# Patient Record
Sex: Male | Born: 1960 | Race: Black or African American | Hispanic: No | Marital: Married | Smoking: Current every day smoker
Health system: Southern US, Community
[De-identification: ages and names within clinical notes are randomized; demographics above are authoritative.]

## PROBLEM LIST (undated history)

## (undated) DIAGNOSIS — J449 Chronic obstructive pulmonary disease, unspecified: Secondary | ICD-10-CM

## (undated) DIAGNOSIS — J45909 Unspecified asthma, uncomplicated: Secondary | ICD-10-CM

## (undated) DIAGNOSIS — E7800 Pure hypercholesterolemia, unspecified: Secondary | ICD-10-CM

## (undated) DIAGNOSIS — S6291XA Unspecified fracture of right wrist and hand, initial encounter for closed fracture: Secondary | ICD-10-CM

## (undated) DIAGNOSIS — M1990 Unspecified osteoarthritis, unspecified site: Secondary | ICD-10-CM

---

## 1998-02-17 ENCOUNTER — Emergency Department (HOSPITAL_COMMUNITY): Admission: EM | Admit: 1998-02-17 | Discharge: 1998-02-17 | Payer: Self-pay | Admitting: Emergency Medicine

## 1998-07-07 ENCOUNTER — Emergency Department (HOSPITAL_COMMUNITY): Admission: EM | Admit: 1998-07-07 | Discharge: 1998-07-07 | Payer: Self-pay | Admitting: Emergency Medicine

## 1998-07-10 ENCOUNTER — Emergency Department (HOSPITAL_COMMUNITY): Admission: EM | Admit: 1998-07-10 | Discharge: 1998-07-10 | Payer: Self-pay | Admitting: Emergency Medicine

## 1998-07-11 ENCOUNTER — Encounter: Admission: RE | Admit: 1998-07-11 | Discharge: 1998-10-09 | Payer: Self-pay | Admitting: Internal Medicine

## 1998-07-27 ENCOUNTER — Emergency Department (HOSPITAL_COMMUNITY): Admission: EM | Admit: 1998-07-27 | Discharge: 1998-07-27 | Payer: Self-pay | Admitting: Emergency Medicine

## 1998-07-27 ENCOUNTER — Encounter: Payer: Self-pay | Admitting: Emergency Medicine

## 2002-10-03 ENCOUNTER — Encounter: Payer: Self-pay | Admitting: Emergency Medicine

## 2002-10-03 ENCOUNTER — Emergency Department (HOSPITAL_COMMUNITY): Admission: EM | Admit: 2002-10-03 | Discharge: 2002-10-03 | Payer: Self-pay | Admitting: Emergency Medicine

## 2003-06-12 ENCOUNTER — Emergency Department (HOSPITAL_COMMUNITY): Admission: EM | Admit: 2003-06-12 | Discharge: 2003-06-12 | Payer: Self-pay | Admitting: *Deleted

## 2005-12-15 ENCOUNTER — Emergency Department (HOSPITAL_COMMUNITY): Admission: EM | Admit: 2005-12-15 | Discharge: 2005-12-15 | Payer: Self-pay | Admitting: Emergency Medicine

## 2008-07-04 ENCOUNTER — Emergency Department (HOSPITAL_COMMUNITY): Admission: EM | Admit: 2008-07-04 | Discharge: 2008-07-04 | Payer: Self-pay | Admitting: Emergency Medicine

## 2009-06-16 ENCOUNTER — Encounter: Admission: RE | Admit: 2009-06-16 | Discharge: 2009-06-16 | Payer: Self-pay | Admitting: Occupational Medicine

## 2009-12-08 ENCOUNTER — Emergency Department (HOSPITAL_COMMUNITY): Admission: EM | Admit: 2009-12-08 | Discharge: 2009-12-08 | Payer: Self-pay | Admitting: Emergency Medicine

## 2011-01-23 LAB — POCT I-STAT, CHEM 8
BUN: 15 mg/dL (ref 6–23)
Calcium, Ion: 1.17 mmol/L (ref 1.12–1.32)
Chloride: 104 mEq/L (ref 96–112)
Creatinine, Ser: 1 mg/dL (ref 0.4–1.5)
Glucose, Bld: 93 mg/dL (ref 70–99)
HCT: 42 % (ref 39.0–52.0)
Hemoglobin: 14.3 g/dL (ref 13.0–17.0)
Potassium: 4.3 mEq/L (ref 3.5–5.1)
Sodium: 138 mEq/L (ref 135–145)
TCO2: 30 mmol/L (ref 0–100)

## 2011-01-23 LAB — HEMOCCULT GUIAC POC 1CARD (OFFICE): Fecal Occult Bld: POSITIVE

## 2013-04-11 ENCOUNTER — Emergency Department (HOSPITAL_COMMUNITY)
Admission: EM | Admit: 2013-04-11 | Discharge: 2013-04-11 | Disposition: A | Discharge status: Home / Self Care | Payer: 59 | Attending: Emergency Medicine | Admitting: Emergency Medicine

## 2013-04-11 ENCOUNTER — Emergency Department (HOSPITAL_COMMUNITY): Payer: 59

## 2013-04-11 ENCOUNTER — Encounter (HOSPITAL_COMMUNITY): Payer: Self-pay | Admitting: *Deleted

## 2013-04-11 DIAGNOSIS — Z9181 History of falling: Secondary | ICD-10-CM | POA: Insufficient documentation

## 2013-04-11 DIAGNOSIS — IMO0002 Reserved for concepts with insufficient information to code with codable children: Secondary | ICD-10-CM | POA: Insufficient documentation

## 2013-04-11 DIAGNOSIS — M25469 Effusion, unspecified knee: Secondary | ICD-10-CM | POA: Insufficient documentation

## 2013-04-11 DIAGNOSIS — F17200 Nicotine dependence, unspecified, uncomplicated: Secondary | ICD-10-CM | POA: Insufficient documentation

## 2013-04-11 DIAGNOSIS — M1710 Unilateral primary osteoarthritis, unspecified knee: Secondary | ICD-10-CM | POA: Insufficient documentation

## 2013-04-11 DIAGNOSIS — M1990 Unspecified osteoarthritis, unspecified site: Secondary | ICD-10-CM

## 2013-04-11 NOTE — ED Provider Notes (Signed)
History    This chart was scribed for Kevin Fitzgerald, non-physician practitioner working with Celene Kras, MD by Leone Payor, ED Scribe. This patient was seen in room TR09C/TR09C and the patient's care was started at 1906.   CSN: [id number]  Arrival date & time 04/11/13  1906   First MD Initiated Contact with Patient 04/11/13 1931      Chief Complaint  Patient presents with  . Knee Pain     The history is provided by the patient. No language interpreter was used.    HPI Comments: CAVION FAIOLA is a 52 y.o. male who presents to the Emergency Department complaining of ongoing, constant R knee pain and swelling for 3 days. States sometimes he has pain that shoots down his R leg. He states he slipped and fell about 2 weeks ago. He rates the pain as 1/10 currently while the knee is elevated and a 10/10 when standing or walking.   History reviewed. No pertinent past medical history.  History reviewed. No pertinent past surgical history.  History reviewed. No pertinent family history.  History  Substance Use Topics  . Smoking status: Current Every Day Smoker    Types: Cigarettes  . Smokeless tobacco: Not on file  . Alcohol Use: No      Review of Systems A complete 10 system review of systems was obtained and all systems are negative except as noted in the HPI and PMH.   Allergies  Review of patient's allergies indicates no known allergies.  Home Medications  No current outpatient prescriptions on file.  BP 157/87  Pulse 67  Temp(Src) 98.5 F (36.9 C) (Oral)  Resp 18  SpO2 97%  Physical Exam  Nursing note and vitals reviewed. Constitutional: He is oriented to person, place, and time. He appears well-developed and well-nourished. No distress.  HENT:  Head: Normocephalic and atraumatic.  Eyes: EOM are normal.  Neck: Neck supple. No tracheal deviation present.  Cardiovascular: Normal rate.   Intact distal pulses.   Pulmonary/Chest: Effort normal. No respiratory  distress.  Musculoskeletal: Normal range of motion.  R knee tender to palpation over the medial joint line and patella. No swelling, no erythema. No signs of cellulitis, or septic joint. ROM 5/5, strength 5/5.     Neurological: He is alert and oriented to person, place, and time.  Sensation intact.   Skin: Skin is warm and dry.  Psychiatric: He has a normal mood and affect. His behavior is normal.    ED Course  Procedures (including critical care time)  DIAGNOSTIC STUDIES: Oxygen Saturation is 97% on room air, adequate by my interpretation.    COORDINATION OF CARE: .8:05 PM Discussed treatment plan with pt at bedside and pt agreed to plan.    Labs Reviewed - No data to display Dg Knee Complete 4 Views Right  04/11/2013   *RADIOLOGY REPORT*  Clinical Data: Anterior medial knee pain for 3 days.  Swelling.  No injury.  RIGHT KNEE - COMPLETE 4+ VIEW  Comparison: None.  Findings: Anatomic alignment of the right knee.  There is no fracture.  Tiny marginal osteophytes are present in the medial compartment.  No effusion. No fracture.  IMPRESSION: Minimal medial compartment osteoarthritis.  No acute abnormality.   Original Report Authenticated By: Andreas Newport, M.D.     1. Osteoarthritis       MDM  Patient with knee osteoarthritis.  Will treat with knee sleeve and tylenol.  Recommended orthopedic follow up.    Patient left  prior to getting knee sleeve.  States that he is sick of waiting.      I personally performed the services described in this documentation, which was scribed in my presence. The recorded information has been reviewed and is accurate.      Kevin Horseman, PA-C 04/11/13 2334

## 2013-04-11 NOTE — ED Notes (Signed)
Pt left without knee sleeve, ortho was at room and pt reports he didn't want knee sleeve. Pt ambulatory with steady gait.

## 2013-04-11 NOTE — ED Notes (Signed)
Pt denies any questions or pain upon discharge. 

## 2013-04-11 NOTE — ED Notes (Signed)
Reports right knee pain and swelling x 2 days, denies injury to knee. Ambulatory at triage.

## 2013-04-11 NOTE — ED Notes (Signed)
Pt is discharged but is awaiting knee sleeve from ortho

## 2013-04-11 NOTE — ED Notes (Signed)
Ortho paged for knee sleeve 

## 2013-04-14 NOTE — ED Provider Notes (Signed)
Medical screening examination/treatment/procedure(s) were performed by non-physician practitioner and as supervising physician I was immediately available for consultation/collaboration.    Glennette Galster R Alexiah Koroma, MD 04/14/13 1115 

## 2013-06-21 ENCOUNTER — Encounter (HOSPITAL_COMMUNITY): Payer: Self-pay | Admitting: Emergency Medicine

## 2013-06-21 ENCOUNTER — Emergency Department (INDEPENDENT_AMBULATORY_CARE_PROVIDER_SITE_OTHER)
Admission: EM | Admit: 2013-06-21 | Discharge: 2013-06-21 | Disposition: A | Discharge status: Other Acute Inpatient Hospital | Payer: 59 | Source: Home / Self Care | Attending: Emergency Medicine | Admitting: Emergency Medicine

## 2013-06-21 ENCOUNTER — Emergency Department (HOSPITAL_COMMUNITY): Payer: 59

## 2013-06-21 ENCOUNTER — Emergency Department (HOSPITAL_COMMUNITY)
Admission: EM | Admit: 2013-06-21 | Discharge: 2013-06-22 | Disposition: A | Discharge status: Home / Self Care | Payer: 59 | Attending: Emergency Medicine | Admitting: Emergency Medicine

## 2013-06-21 DIAGNOSIS — R109 Unspecified abdominal pain: Secondary | ICD-10-CM | POA: Insufficient documentation

## 2013-06-21 DIAGNOSIS — N390 Urinary tract infection, site not specified: Secondary | ICD-10-CM | POA: Diagnosis present

## 2013-06-21 DIAGNOSIS — R339 Retention of urine, unspecified: Secondary | ICD-10-CM

## 2013-06-21 DIAGNOSIS — R319 Hematuria, unspecified: Secondary | ICD-10-CM

## 2013-06-21 DIAGNOSIS — B9689 Other specified bacterial agents as the cause of diseases classified elsewhere: Secondary | ICD-10-CM | POA: Diagnosis present

## 2013-06-21 DIAGNOSIS — I10 Essential (primary) hypertension: Secondary | ICD-10-CM | POA: Insufficient documentation

## 2013-06-21 DIAGNOSIS — F17200 Nicotine dependence, unspecified, uncomplicated: Secondary | ICD-10-CM | POA: Insufficient documentation

## 2013-06-21 DIAGNOSIS — N400 Enlarged prostate without lower urinary tract symptoms: Secondary | ICD-10-CM | POA: Insufficient documentation

## 2013-06-21 DIAGNOSIS — R300 Dysuria: Secondary | ICD-10-CM | POA: Diagnosis present

## 2013-06-21 LAB — CBC WITH DIFFERENTIAL/PLATELET
Basophils Absolute: 0.1 10*3/uL (ref 0.0–0.1)
Basophils Relative: 1 % (ref 0–1)
Eosinophils Relative: 6 % — ABNORMAL HIGH (ref 0–5)
HCT: 37.4 % — ABNORMAL LOW (ref 39.0–52.0)
Lymphocytes Relative: 33 % (ref 12–46)
MCHC: 34.2 g/dL (ref 30.0–36.0)
MCV: 87.2 fL (ref 78.0–100.0)
Monocytes Absolute: 0.8 10*3/uL (ref 0.1–1.0)
RDW: 12.9 % (ref 11.5–15.5)

## 2013-06-21 LAB — POCT URINALYSIS DIP (DEVICE)
Ketones, ur: NEGATIVE mg/dL
Protein, ur: 30 mg/dL — AB
Specific Gravity, Urine: 1.02 (ref 1.005–1.030)
pH: 7 (ref 5.0–8.0)

## 2013-06-21 LAB — BASIC METABOLIC PANEL
CO2: 28 mEq/L (ref 19–32)
Calcium: 10.2 mg/dL (ref 8.4–10.5)
Creatinine, Ser: 1.19 mg/dL (ref 0.50–1.35)

## 2013-06-21 MED ORDER — LORAZEPAM 2 MG/ML IJ SOLN
2.0000 mg | Freq: Once | INTRAMUSCULAR | Status: AC
  Administered 2013-06-21: 2 mg via INTRAMUSCULAR
  Filled 2013-06-21: qty 1

## 2013-06-21 MED ORDER — CEPHALEXIN 500 MG PO CAPS: 1000.0000 mg | ORAL_CAPSULE | Freq: Two times a day (BID) | ORAL | Status: DC

## 2013-06-21 MED ORDER — METRONIDAZOLE 500 MG PO TABS: 500.0000 mg | ORAL_TABLET | Freq: Two times a day (BID) | ORAL | Status: DC

## 2013-06-21 NOTE — ED Provider Notes (Signed)
  CSN: [id number]     Arrival date & time 06/21/13  1844 History     First MD Initiated Contact with Patient 06/21/13 1910     Chief Complaint  Patient presents with  . Back Pain   (Consider location/radiation/quality/duration/timing/severity/associated sxs/prior Treatment) HPI Comments: 52  Yo male with low back pain x 2years, and now hesitancy, frequency with urine, increased nocturia and hematuria x several days. Now with abdomen pain.   History reviewed. No pertinent past medical history. History reviewed. No pertinent past surgical history. History reviewed. No pertinent family history. History  Substance Use Topics  . Smoking status: Current Every Day Smoker    Types: Cigarettes  . Smokeless tobacco: Not on file  . Alcohol Use: No    Review of Systems  Constitutional: Negative.   Respiratory: Negative.   Cardiovascular: Negative.   Gastrointestinal: Positive for abdominal pain.  Genitourinary: Positive for urgency, frequency, hematuria, flank pain, decreased urine volume and difficulty urinating.  Skin: Negative.   Psychiatric/Behavioral: Negative.     Allergies  Review of patient's allergies indicates no known allergies.  Home Medications  No current outpatient prescriptions on file. BP 153/86  Pulse 75  Temp(Src) 97.9 F (36.6 C) (Oral)  Resp 12  SpO2 100% Physical Exam  Nursing note reviewed. Constitutional: He is oriented to person, place, and time. He appears well-developed and well-nourished.  Cardiovascular: Normal rate, regular rhythm and normal heart sounds.   Pulmonary/Chest: Effort normal and breath sounds normal.  Abdominal: Soft. He exhibits distension. There is tenderness.  Supra pubic discomfort  Genitourinary:  Transferred to ER not performed  Neurological: He is alert and oriented to person, place, and time.  Skin: Skin is warm and dry.  Psychiatric: Judgment normal.    ED Course   Procedures (including critical care time)  Labs  Reviewed  POCT URINALYSIS DIP (DEVICE) - Abnormal; Notable for the following:    Hgb urine dipstick TRACE (*)    Protein, ur 30 (*)    All other components within normal limits   No results found. 1. Hematuria   2. Abdominal pain   3. Urinary retention     MDM  Urinary retention vs Kidney Stone vs Prostatitis Patient sent to ER with increased symptoms since start of exam.  Berenice Primas, PA-C 06/21/13 2029

## 2013-06-21 NOTE — ED Notes (Signed)
Bladder scan revealed 200

## 2013-06-21 NOTE — ED Notes (Signed)
C/o urinary frequency. Lower back and flank pain. Hematuria noticed today.  Also having nausea and headaches.  Tried otc prostate meds with no relief. States symptoms present for 2 yrs.

## 2013-06-21 NOTE — ED Provider Notes (Signed)
Medical screening examination/treatment/procedure(s) were performed by non-physician practitioner and as supervising physician I was immediately available for consultation/collaboration.  Sutter Ahlgren, M.D.  Birgit Nowling C Elizeth Weinrich, MD 06/21/13 2035 

## 2013-06-21 NOTE — ED Notes (Signed)
Patient states that he has had trouble urinating for 2+ years now, once he gets it going it is difficult to keep stream flowing, pain rated at 6/10, pain comes and goes, wants to seek help before the pain gets worse.

## 2013-06-21 NOTE — ED Notes (Signed)
PT. REPORTS DIFFICULTY STARTING URINATION / HESITATION FOR 2 YEARS - CONCERNED ABOUT PROSTATE ENLARGEMENT , SEEN AT [facility] TRANSFERRED HERE FOR FURTHER EVALUATION , URINE SPECIMEN COLLECTED AT URGENT CARE THIS EVENING , DENIES HEMATURIA /FEVER OR CHILLS.

## 2013-06-21 NOTE — ED Notes (Signed)
Pt unable to void. States that he just went to the bathroom.

## 2013-06-21 NOTE — ED Provider Notes (Signed)
CSN: [id number]     Arrival date & time 06/21/13  2035 History     First MD Initiated Contact with Patient 06/21/13 2139     Chief Complaint  Patient presents with  . Dysuria   (Consider location/radiation/quality/duration/timing/severity/associated sxs/prior Treatment) Patient is a 52 y.o. male presenting with male genitourinary complaint. The history is provided by the patient.  Male GU Problem Presenting symptoms: no dysuria   Presenting symptoms comment:  Difficulty urinating  Context: spontaneously   Relieved by:  Nothing Worsened by:  Nothing tried Ineffective treatments:  None tried Associated symptoms: abdominal pain (mild suprapubic pain)   Associated symptoms: no diarrhea, no fever, no hematuria, no nausea and no vomiting   Abdominal pain:    Location:  Suprapubic   Quality:  Dull   Severity:  Mild   Onset quality:  Gradual   Duration:  1 day   Timing:  Constant   Progression:  Unchanged   Chronicity:  New Risk factors: no kidney stones     Past Medical History  Diagnosis Date  . Hypertension    History reviewed. No pertinent past surgical history. No family history on file. History  Substance Use Topics  . Smoking status: Current Every Day Smoker    Types: Cigarettes  . Smokeless tobacco: Not on file  . Alcohol Use: No    Review of Systems  Constitutional: Negative for fever.  HENT: Negative for rhinorrhea, drooling and neck pain.   Eyes: Negative for pain.  Respiratory: Negative for cough and shortness of breath.   Cardiovascular: Negative for chest pain and leg swelling.  Gastrointestinal: Positive for abdominal pain (mild suprapubic pain). Negative for nausea, vomiting and diarrhea.  Genitourinary: Negative for dysuria and hematuria.  Musculoskeletal: Negative for gait problem.  Skin: Negative for color change.  Neurological: Negative for numbness and headaches.  Hematological: Negative for adenopathy.  Psychiatric/Behavioral: Negative for  behavioral problems.  All other systems reviewed and are negative.    Allergies  Review of patient's allergies indicates no known allergies.  Home Medications   Current Outpatient Rx  Name  Route  Sig  Dispense  Refill  . traMADol (ULTRAM) 50 MG tablet   Oral   Take 50 mg by mouth every 6 (six) hours as needed for pain.          BP 159/94  Pulse 67  Temp(Src) 98.1 F (36.7 C) (Oral)  Resp 16  Ht 5\' 9"  (1.753 m)  Wt 165 lb (74.844 kg)  BMI 24.36 kg/m2  SpO2 100% Physical Exam  Nursing note and vitals reviewed. Constitutional: He is oriented to person, place, and time. He appears well-developed and well-nourished.  HENT:  Head: Normocephalic and atraumatic.  Right Ear: External ear normal.  Left Ear: External ear normal.  Nose: Nose normal.  Mouth/Throat: Oropharynx is clear and moist. No oropharyngeal exudate.  Eyes: Conjunctivae and EOM are normal. Pupils are equal, round, and reactive to light.  Neck: Normal range of motion. Neck supple.  Cardiovascular: Normal rate, regular rhythm, normal heart sounds and intact distal pulses.  Exam reveals no gallop and no friction rub.   No murmur heard. Pulmonary/Chest: Effort normal and breath sounds normal. No respiratory distress. He has no wheezes.  Abdominal: Soft. Bowel sounds are normal. He exhibits no distension. There is tenderness (mild suprapubic ttp). There is no rebound and no guarding.  Genitourinary:  Enlarged boggy prostate on exam. No ttp during rectal exam.   Musculoskeletal: Normal range of motion. He exhibits  no edema and no tenderness.  Neurological: He is alert and oriented to person, place, and time.  Skin: Skin is warm and dry.  Psychiatric: He has a normal mood and affect. His behavior is normal.    ED Course   Procedures (including critical care time)  Labs Reviewed  CBC WITH DIFFERENTIAL - Abnormal; Notable for the following:    WBC 10.8 (*)    Hemoglobin 12.8 (*)    HCT 37.4 (*)     Eosinophils Relative 6 (*)    All other components within normal limits  BASIC METABOLIC PANEL - Abnormal; Notable for the following:    GFR calc non Af Amer 70 (*)    GFR calc Af Amer 81 (*)    All other components within normal limits  POCT I-STAT, CHEM 8 - Abnormal; Notable for the following:    Glucose, Bld 160 (*)    Calcium, Ion 1.08 (*)    Hemoglobin 11.9 (*)    HCT 35.0 (*)    All other components within normal limits  POCT I-STAT TROPONIN I   Ct Abdomen Pelvis Wo Contrast  06/21/2013   *RADIOLOGY REPORT*  Clinical Data: Left flank pain.  Lower abdominal pain.  Difficulty urinating.  CT ABDOMEN AND PELVIS WITHOUT CONTRAST  Technique:  Multidetector CT imaging of the abdomen and pelvis was performed following the standard protocol without intravenous contrast.  Comparison: None.  Findings: There is marked distension of the bladder with diffusely thickened wall.  There is bilateral ureterectasis and pyelocaliectasis.  Changes likely represent bladder outlet obstruction due to enlarged prostate, measuring 5.4 x 5.3 x 5.9 cm.  The unenhanced appearance of the liver, spleen, gallbladder, pancreas, adrenal glands, abdominal aorta, inferior vena cava, and retroperitoneal lymph nodes are unremarkable.  The stomach, small bowel, and colon do not appear abnormally distended.  No free air or free fluid is identified in the abdomen.  Abdominal wall musculature appears intact.  Pelvis:  Pelvic organs are displaced by the enlarged bladder. Sigmoid colon is not distended.  No obvious free or loculated pelvic fluid collection or lymphadenopathy.  Normal alignment of the lumbar spine.  No destructive or sclerotic bone lesions are appreciated.  IMPRESSION: Marked enlargement of the bladder with diffusely thickened wall and bilateral ureterectasis and hydronephrosis.  This is likely due to bladder outlet obstruction caused by the enlarged prostate gland. Correlation with PSA and digital rectal examination is  recommended.   Original Report Authenticated By: Burman Nieves, M.D.   1. BPH (benign prostatic hyperplasia)     MDM  10:07 PM 52 y.o. male pw acute worsening of hesitancy/freq x 2 weeks. Pt notes suprapubic pain, feels like he has to void, but is unable to get it out. Pt AFVSS here, appears well otherwise. Will get CT abd to r/o stone, UA, labs, rectal. 200cc on bladder scan. I reviewed urine dip from UC.   CT cw bph. Attempted twice to place foley unsuccessfully. I discussed case w/ on call [facility] ([facility]). As pt still able to void his bladder to some degree and Cr stable, I believe he is stable to go home w/ close f/u in clinic tomorrow as recommended by on call urologist. Will give flomax now and Rx for home.    I have discussed the diagnosis/risks/treatment options with the patient and family and believe the pt to be eligible for discharge home to follow-up with [facility] tomorrow. We also discussed returning to the ED immediately if new or worsening sx occur.  We discussed the sx which are most concerning (e.g., inability to f/u, worsening pain) that necessitate immediate return. Any new prescriptions provided to the patient are listed below.  Discharge Medication List as of 06/22/2013 12:40 AM    START taking these medications   Details  tamsulosin (FLOMAX) 0.4 MG CAPS capsule Take 1 capsule (0.4 mg total) by mouth daily., Starting 06/22/2013, Until Discontinued, Print         Junius Argyle, MD 06/22/13 [phone]

## 2013-06-22 DIAGNOSIS — N400 Enlarged prostate without lower urinary tract symptoms: Secondary | ICD-10-CM | POA: Diagnosis present

## 2013-06-22 LAB — POCT I-STAT, CHEM 8
BUN: 10 mg/dL (ref 6–23)
Calcium, Ion: 1.08 mmol/L — ABNORMAL LOW (ref 1.12–1.23)
Chloride: 103 mEq/L (ref 96–112)
Creatinine, Ser: 0.9 mg/dL (ref 0.50–1.35)
Glucose, Bld: 160 mg/dL — ABNORMAL HIGH (ref 70–99)
HCT: 35 % — ABNORMAL LOW (ref 39.0–52.0)
Hemoglobin: 11.9 g/dL — ABNORMAL LOW (ref 13.0–17.0)
Potassium: 4.2 mEq/L (ref 3.5–5.1)
Sodium: 138 mEq/L (ref 135–145)
TCO2: 25 mmol/L (ref 0–100)

## 2013-06-22 LAB — POCT I-STAT TROPONIN I: Troponin i, poc: 0.08 ng/mL (ref 0.00–0.08)

## 2013-06-22 MED ORDER — TAMSULOSIN HCL 0.4 MG PO CAPS
0.4000 mg | ORAL_CAPSULE | ORAL | Status: AC
  Administered 2013-06-22: 0.4 mg via ORAL
  Filled 2013-06-22: qty 1

## 2013-06-22 MED ORDER — TAMSULOSIN HCL 0.4 MG PO CAPS: 0.4000 mg | ORAL_CAPSULE | Freq: Every day | ORAL | Status: DC

## 2013-07-01 ENCOUNTER — Emergency Department (HOSPITAL_COMMUNITY)
Admission: EM | Admit: 2013-07-01 | Discharge: 2013-07-01 | Disposition: A | Discharge status: Home / Self Care | Payer: 59 | Attending: Emergency Medicine | Admitting: Emergency Medicine

## 2013-07-01 DIAGNOSIS — N400 Enlarged prostate without lower urinary tract symptoms: Secondary | ICD-10-CM | POA: Insufficient documentation

## 2013-07-01 DIAGNOSIS — Z79899 Other long term (current) drug therapy: Secondary | ICD-10-CM | POA: Insufficient documentation

## 2013-07-01 DIAGNOSIS — R319 Hematuria, unspecified: Secondary | ICD-10-CM | POA: Insufficient documentation

## 2013-07-01 DIAGNOSIS — F17200 Nicotine dependence, unspecified, uncomplicated: Secondary | ICD-10-CM | POA: Insufficient documentation

## 2013-07-01 DIAGNOSIS — I10 Essential (primary) hypertension: Secondary | ICD-10-CM | POA: Insufficient documentation

## 2013-07-01 LAB — URINALYSIS, ROUTINE W REFLEX MICROSCOPIC
Bilirubin Urine: NEGATIVE
Glucose, UA: NEGATIVE mg/dL
Ketones, ur: NEGATIVE mg/dL
Nitrite: NEGATIVE
pH: 5.5 (ref 5.0–8.0)

## 2013-07-01 LAB — POCT I-STAT, CHEM 8
BUN: 21 mg/dL (ref 6–23)
Calcium, Ion: 1.16 mmol/L (ref 1.12–1.23)
Chloride: 103 mEq/L (ref 96–112)
Creatinine, Ser: 0.9 mg/dL (ref 0.50–1.35)
Glucose, Bld: 96 mg/dL (ref 70–99)

## 2013-07-01 LAB — URINE MICROSCOPIC-ADD ON

## 2013-07-01 MED ORDER — NITROFURANTOIN MONOHYD MACRO 100 MG PO CAPS: 100.0000 mg | ORAL_CAPSULE | Freq: Two times a day (BID) | ORAL | Status: DC

## 2013-07-01 NOTE — ED Provider Notes (Signed)
CSN: [id number]     Arrival date & time 07/01/13  1747 History   First MD Initiated Contact with Patient 07/01/13 1810     Chief Complaint  Patient presents with  . Hematuria   (Consider location/radiation/quality/duration/timing/severity/associated sxs/prior Treatment) HPI Comments: Patient with history of BPH, recent Foley placement for outlet obstruction -- presents with complaint of hematuria that began today. Patient has noted clots in his urine bag. He has some pain externally that is related to his catheter. He denies fever, nausea, vomiting. He denies abdominal pain. No treatments prior to arrival. Patient is currently taking finasteride and tamsulosin. He is following up with his urologist next week for potential removal of the Foley catheter. The onset of this condition was acute. The course is constant. Aggravating factors: none. Alleviating factors: none.    The history is provided by the patient.    Past Medical History  Diagnosis Date  . Hypertension    No past surgical history on file. No family history on file. History  Substance Use Topics  . Smoking status: Current Every Day Smoker    Types: Cigarettes  . Smokeless tobacco: Not on file  . Alcohol Use: No    Review of Systems  Constitutional: Negative for fever.  HENT: Negative for sore throat and rhinorrhea.   Eyes: Negative for redness.  Respiratory: Negative for cough.   Cardiovascular: Negative for chest pain.  Gastrointestinal: Negative for nausea, vomiting, abdominal pain and diarrhea.  Genitourinary: Positive for hematuria. Negative for dysuria.  Musculoskeletal: Negative for myalgias.  Skin: Negative for rash.  Neurological: Negative for headaches.    Allergies  Review of patient's allergies indicates no known allergies.  Home Medications   Current Outpatient Rx  Name  Route  Sig  Dispense  Refill  . finasteride (PROSCAR) 5 MG tablet   Oral   Take 5 mg by mouth daily.         . tamsulosin  (FLOMAX) 0.4 MG CAPS capsule   Oral   Take 1 capsule (0.4 mg total) by mouth daily.   30 capsule   0    BP 138/85  Pulse 76  Temp(Src) 98.1 F (36.7 C) (Oral)  Resp 16  SpO2 99% Physical Exam  Nursing note and vitals reviewed. Constitutional: He appears well-developed and well-nourished.  HENT:  Head: Normocephalic and atraumatic.  Eyes: Conjunctivae are normal. Right eye exhibits no discharge. Left eye exhibits no discharge.  Neck: Normal range of motion. Neck supple.  Cardiovascular: Normal rate, regular rhythm and normal heart sounds.   Pulmonary/Chest: Effort normal and breath sounds normal.  Abdominal: Soft. There is no tenderness.  Genitourinary:  Blood clot in urine bag, no erosions externally.   Neurological: He is alert.  Skin: Skin is warm and dry.  Psychiatric: He has a normal mood and affect.    ED Course  Procedures (including critical care time) Labs Review Labs Reviewed  URINALYSIS, ROUTINE W REFLEX MICROSCOPIC - Abnormal; Notable for the following:    Color, Urine RED (*)    APPearance CLOUDY (*)    Hgb urine dipstick LARGE (*)    Protein, ur 100 (*)    Leukocytes, UA MODERATE (*)    All other components within normal limits  URINE MICROSCOPIC-ADD ON  POCT I-STAT, CHEM 8   Imaging Review No results found.  Patient seen and examined. Work-up initiated. Medications ordered.   Vital signs reviewed and are as follows: Filed Vitals:   07/01/13 1816  BP: 138/85  Pulse:  76  Temp: 98.1 F (36.7 C)  Resp: 16   D/w Dr. Blinda Leatherwood.   I-stat shows normal creatinine.   Patient informed of results. Encouraged followup with urologist as planned next week. Patient urged to return with worsening bleeding, fever, or worsening pain. He verbalizes understanding and agrees with plan.  MDM   1. Hematuria    Acute onset of blood and patient with Foley catheter. UA is unrevealing as red blood cells obscure the field. Given potential for infection will start  Macrobid. Patient has urology followup next week. He appears well, abdomen is soft, nontender.    Renne Crigler, PA-C 07/01/13 2000

## 2013-07-01 NOTE — ED Notes (Signed)
Pt states he has blood clots in his urine since today. Pt arrives with Foley cath in place with sanguinous colored urine with blood clots. Pt states he has pain at the tip of his penis. Pt ambulatory to exam room with steady gait. Pt states he was told to come to ED by his Urologist, Dr. Berneice Heinrich.

## 2013-07-05 NOTE — ED Provider Notes (Signed)
Medical screening examination/treatment/procedure(s) were performed by non-physician practitioner and as supervising physician I was immediately available for consultation/collaboration.    Clarance Bollard J. Tyshaun Vinzant, MD 07/05/13 0730 

## 2013-09-14 ENCOUNTER — Other Ambulatory Visit: Payer: Self-pay | Admitting: Urology

## 2013-09-15 ENCOUNTER — Encounter (HOSPITAL_COMMUNITY): Payer: Self-pay | Admitting: Pharmacy Technician

## 2013-09-17 ENCOUNTER — Ambulatory Visit (HOSPITAL_COMMUNITY)
Admission: RE | Admit: 2013-09-17 | Discharge: 2013-09-17 | Disposition: A | Discharge status: Home / Self Care | Payer: 59 | Source: Ambulatory Visit | Attending: Urology | Admitting: Urology

## 2013-09-17 ENCOUNTER — Encounter (HOSPITAL_COMMUNITY)
Admission: RE | Admit: 2013-09-17 | Discharge: 2013-09-17 | Disposition: A | Discharge status: Home / Self Care | Payer: 59 | Source: Ambulatory Visit | Attending: Urology | Admitting: Urology

## 2013-09-17 ENCOUNTER — Encounter (HOSPITAL_COMMUNITY): Payer: Self-pay

## 2013-09-17 DIAGNOSIS — Z01818 Encounter for other preprocedural examination: Secondary | ICD-10-CM | POA: Insufficient documentation

## 2013-09-17 DIAGNOSIS — J45909 Unspecified asthma, uncomplicated: Secondary | ICD-10-CM | POA: Insufficient documentation

## 2013-09-17 DIAGNOSIS — I10 Essential (primary) hypertension: Secondary | ICD-10-CM | POA: Insufficient documentation

## 2013-09-17 DIAGNOSIS — Z01812 Encounter for preprocedural laboratory examination: Secondary | ICD-10-CM | POA: Insufficient documentation

## 2013-09-17 DIAGNOSIS — Z01810 Encounter for preprocedural cardiovascular examination: Secondary | ICD-10-CM | POA: Insufficient documentation

## 2013-09-17 HISTORY — DX: Unspecified osteoarthritis, unspecified site: M19.90

## 2013-09-17 HISTORY — DX: Unspecified asthma, uncomplicated: J45.909

## 2013-09-17 LAB — CBC
HCT: 39.5 % (ref 39.0–52.0)
MCV: 87 fL (ref 78.0–100.0)
RBC: 4.54 MIL/uL (ref 4.22–5.81)
WBC: 8.3 10*3/uL (ref 4.0–10.5)

## 2013-09-17 LAB — BASIC METABOLIC PANEL
BUN: 21 mg/dL (ref 6–23)
CO2: 27 mEq/L (ref 19–32)
Chloride: 99 mEq/L (ref 96–112)
Creatinine, Ser: 0.94 mg/dL (ref 0.50–1.35)
Potassium: 4.2 mEq/L (ref 3.5–5.1)

## 2013-09-17 NOTE — Patient Instructions (Addendum)
20      Your procedure is scheduled on:  Wednesday 09/22/2013  Report to [facility] at 1000  AM.  Call this number if you have problems the night before or morning of surgery:  [phone]   Remember:             IF YOU USE CPAP,BRING MASK AND TUBING AM OF SURGERY!   Do not eat food or drink liquids AFTER MIDNIGHT!  Take these medicines the morning of surgery with A SIP OF WATER: NONE   Do not bring valuables to the hospital. [facility] IS NOT RESPONSIBLE  FOR ANY BELONGINGS OR VALUABLES BROUGHT TO HOSPITAL.  Marland Kitchen  Leave suitcase in the car. After surgery it may be brought to your room.  For patients admitted to the hospital, checkout time is 11:00 AM the day of              Discharge.    DO NOT WEAR JEWELRY , MAKE-UP, LOTIONS,POWDERS,PERFUMES!             WOMEN -DO NOT SHAVE LEGS OR UNDERARMS 12 HRS. BEFORE SURGERY!               MEN MAY SHAVE AS USUAL!             CONTACTS,DENTURES OR BRIDGEWORK, FALSE EYELASHES MAY NOT BE WORN INTO SURGERY!                                           Patients discharged the day of surgery will not be allowed to drive home. If going home the same day of surgery, must have someone stay with you first 24 hrs.at home and arrange for someone to drive you home from the              Hospital.              YOUR DRIVER UV:OZDGUY- Dondra Spry   Special Instructions:             Please read over the following fact sheets that you were given:             1. [facility] PREPARING FOR SURGERY SHEET              2.[facility]                                        Telford Nab.Rhilynn Preyer,RN,BSN     [phone]                FAILURE TO FOLLOW THESE INSTRUCTIONS MAY RESULT IN  CANCELLATION OF YOUR SURGERY!               Patient Signature:___________________________

## 2013-09-17 NOTE — Progress Notes (Signed)
09/17/13 0918  OBSTRUCTIVE SLEEP APNEA  Have you ever been diagnosed with sleep apnea through a sleep study? No  Do you snore loudly (loud enough to be heard through closed doors)?  1  Do you often feel tired, fatigued, or sleepy during the daytime? 1  Has anyone observed you stop breathing during your sleep? 0  Do you have, or are you being treated for high blood pressure? 1  BMI more than 35 kg/m2? 0  Age over 50 years old? 0  Neck circumference greater than 40 cm/18 inches? 0  Gender: 1  Obstructive Sleep Apnea Score 4  Score 4 or greater  Results sent to PCP

## 2013-09-22 ENCOUNTER — Encounter (HOSPITAL_COMMUNITY): Payer: 59 | Admitting: Certified Registered Nurse Anesthetist

## 2013-09-22 ENCOUNTER — Ambulatory Visit (HOSPITAL_COMMUNITY)
Admission: RE | Admit: 2013-09-22 | Discharge: 2013-09-23 | Disposition: A | Discharge status: Home / Self Care | Payer: 59 | Source: Ambulatory Visit | Attending: Urology | Admitting: Urology

## 2013-09-22 ENCOUNTER — Encounter (HOSPITAL_COMMUNITY): Payer: Self-pay

## 2013-09-22 ENCOUNTER — Encounter (HOSPITAL_COMMUNITY)
Admission: RE | Disposition: A | Discharge status: Home / Self Care | Payer: Self-pay | Source: Ambulatory Visit | Attending: Urology

## 2013-09-22 ENCOUNTER — Ambulatory Visit (HOSPITAL_COMMUNITY): Payer: 59 | Admitting: Certified Registered Nurse Anesthetist

## 2013-09-22 DIAGNOSIS — I10 Essential (primary) hypertension: Secondary | ICD-10-CM | POA: Insufficient documentation

## 2013-09-22 DIAGNOSIS — F17200 Nicotine dependence, unspecified, uncomplicated: Secondary | ICD-10-CM | POA: Insufficient documentation

## 2013-09-22 DIAGNOSIS — N138 Other obstructive and reflux uropathy: Secondary | ICD-10-CM | POA: Insufficient documentation

## 2013-09-22 DIAGNOSIS — R339 Retention of urine, unspecified: Secondary | ICD-10-CM | POA: Insufficient documentation

## 2013-09-22 DIAGNOSIS — N401 Enlarged prostate with lower urinary tract symptoms: Secondary | ICD-10-CM | POA: Insufficient documentation

## 2013-09-22 DIAGNOSIS — J45909 Unspecified asthma, uncomplicated: Secondary | ICD-10-CM | POA: Insufficient documentation

## 2013-09-22 HISTORY — PX: TRANSURETHRAL RESECTION OF PROSTATE: SHX73

## 2013-09-22 LAB — HEMOGLOBIN AND HEMATOCRIT, BLOOD: Hemoglobin: 12.9 g/dL — ABNORMAL LOW (ref 13.0–17.0)

## 2013-09-22 SURGERY — TRANSURETHRAL RESECTION OF THE PROSTATE WITH GYRUS INSTRUMENTS
Anesthesia: General | Wound class: Clean Contaminated

## 2013-09-22 MED ORDER — OXYCODONE HCL 5 MG PO TABS
5.0000 mg | ORAL_TABLET | ORAL | Status: DC | PRN
  Administered 2013-09-22 – 2013-09-23 (×3): 5 mg via ORAL
  Filled 2013-09-22 (×3): qty 1

## 2013-09-22 MED ORDER — METHYLENE BLUE 1 % INJ SOLN
INTRAMUSCULAR | Status: AC
  Filled 2013-09-22: qty 10

## 2013-09-22 MED ORDER — LACTATED RINGERS IV SOLN
INTRAVENOUS | Status: DC
  Administered 2013-09-22: 1000 mL via INTRAVENOUS

## 2013-09-22 MED ORDER — MEPERIDINE HCL 50 MG/ML IJ SOLN: 6.2500 mg | INTRAMUSCULAR | Status: DC | PRN

## 2013-09-22 MED ORDER — KCL IN DEXTROSE-NACL 20-5-0.45 MEQ/L-%-% IV SOLN
INTRAVENOUS | Status: DC
  Administered 2013-09-22 – 2013-09-23 (×2): via INTRAVENOUS
  Filled 2013-09-22 (×2): qty 1000

## 2013-09-22 MED ORDER — GENTAMICIN SULFATE 40 MG/ML IJ SOLN
350.0000 mg | INTRAVENOUS | Status: AC
  Administered 2013-09-22: 350 mg via INTRAVENOUS
  Filled 2013-09-22 (×2): qty 8.75

## 2013-09-22 MED ORDER — EPHEDRINE SULFATE 50 MG/ML IJ SOLN
INTRAMUSCULAR | Status: AC
  Filled 2013-09-22: qty 1

## 2013-09-22 MED ORDER — FINASTERIDE 5 MG PO TABS
5.0000 mg | ORAL_TABLET | Freq: Every day | ORAL | Status: DC
  Administered 2013-09-22: 5 mg via ORAL
  Filled 2013-09-22 (×2): qty 1

## 2013-09-22 MED ORDER — FENTANYL CITRATE 0.05 MG/ML IJ SOLN
INTRAMUSCULAR | Status: DC | PRN
  Administered 2013-09-22 (×4): 25 ug via INTRAVENOUS

## 2013-09-22 MED ORDER — ACETAMINOPHEN 500 MG PO TABS
1000.0000 mg | ORAL_TABLET | Freq: Four times a day (QID) | ORAL | Status: DC
  Administered 2013-09-22 – 2013-09-23 (×3): 1000 mg via ORAL
  Filled 2013-09-22 (×5): qty 2

## 2013-09-22 MED ORDER — LIDOCAINE HCL (CARDIAC) 20 MG/ML IV SOLN
INTRAVENOUS | Status: AC
  Filled 2013-09-22: qty 5

## 2013-09-22 MED ORDER — INFLUENZA VAC SPLIT QUAD 0.5 ML IM SUSP: 0.5000 mL | INTRAMUSCULAR | Status: DC

## 2013-09-22 MED ORDER — MIDAZOLAM HCL 5 MG/5ML IJ SOLN
INTRAMUSCULAR | Status: DC | PRN
  Administered 2013-09-22: 2 mg via INTRAVENOUS

## 2013-09-22 MED ORDER — FENTANYL CITRATE 0.05 MG/ML IJ SOLN: 25.0000 ug | INTRAMUSCULAR | Status: DC | PRN

## 2013-09-22 MED ORDER — ONDANSETRON HCL 4 MG/2ML IJ SOLN
INTRAMUSCULAR | Status: DC | PRN
  Administered 2013-09-22: 4 mg via INTRAVENOUS

## 2013-09-22 MED ORDER — HYDROMORPHONE HCL PF 1 MG/ML IJ SOLN: 0.5000 mg | INTRAMUSCULAR | Status: DC | PRN

## 2013-09-22 MED ORDER — INFLUENZA VAC SPLIT QUAD 0.5 ML IM SUSP
0.5000 mL | INTRAMUSCULAR | Status: DC
  Filled 2013-09-22: qty 0.5

## 2013-09-22 MED ORDER — SENNA 8.6 MG PO TABS
1.0000 | ORAL_TABLET | Freq: Two times a day (BID) | ORAL | Status: DC
  Administered 2013-09-22: 21:00:00 8.6 mg via ORAL
  Filled 2013-09-22: qty 1

## 2013-09-22 MED ORDER — MIDAZOLAM HCL 2 MG/2ML IJ SOLN
INTRAMUSCULAR | Status: AC
  Filled 2013-09-22: qty 2

## 2013-09-22 MED ORDER — PROPOFOL 10 MG/ML IV BOLUS
INTRAVENOUS | Status: AC
  Filled 2013-09-22: qty 20

## 2013-09-22 MED ORDER — CIPROFLOXACIN HCL 500 MG PO TABS
500.0000 mg | ORAL_TABLET | Freq: Two times a day (BID) | ORAL | Status: DC
  Administered 2013-09-22 – 2013-09-23 (×2): 500 mg via ORAL
  Filled 2013-09-22 (×4): qty 1

## 2013-09-22 MED ORDER — SODIUM CHLORIDE 0.9 % IR SOLN
3000.0000 mL | Status: DC
  Administered 2013-09-22: 17:00:00 3000 mL

## 2013-09-22 MED ORDER — DOCUSATE SODIUM 100 MG PO CAPS
100.0000 mg | ORAL_CAPSULE | Freq: Two times a day (BID) | ORAL | Status: DC
  Administered 2013-09-22: 21:00:00 100 mg via ORAL
  Filled 2013-09-22 (×3): qty 1

## 2013-09-22 MED ORDER — PROPOFOL 10 MG/ML IV BOLUS
INTRAVENOUS | Status: DC | PRN
  Administered 2013-09-22: 200 mg via INTRAVENOUS

## 2013-09-22 MED ORDER — LACTATED RINGERS IV SOLN: INTRAVENOUS | Status: DC

## 2013-09-22 MED ORDER — ONDANSETRON HCL 4 MG/2ML IJ SOLN
INTRAMUSCULAR | Status: AC
  Filled 2013-09-22: qty 2

## 2013-09-22 MED ORDER — SODIUM CHLORIDE 0.9 % IR SOLN
Status: DC | PRN
  Administered 2013-09-22: 13000 mL via INTRAVESICAL

## 2013-09-22 MED ORDER — LIDOCAINE HCL (CARDIAC) 20 MG/ML IV SOLN
INTRAVENOUS | Status: DC | PRN
  Administered 2013-09-22: 70 mg via INTRAVENOUS

## 2013-09-22 MED ORDER — FENTANYL CITRATE 0.05 MG/ML IJ SOLN
INTRAMUSCULAR | Status: AC
  Filled 2013-09-22: qty 5

## 2013-09-22 MED ORDER — PROMETHAZINE HCL 25 MG/ML IJ SOLN: 6.2500 mg | INTRAMUSCULAR | Status: DC | PRN

## 2013-09-22 MED ORDER — EPHEDRINE SULFATE 50 MG/ML IJ SOLN
INTRAMUSCULAR | Status: DC | PRN
  Administered 2013-09-22 (×4): 5 mg via INTRAVENOUS
  Administered 2013-09-22: 10 mg via INTRAVENOUS

## 2013-09-22 MED ORDER — METHYLENE BLUE 1 % INJ SOLN
INTRAMUSCULAR | Status: DC | PRN
  Administered 2013-09-22: 10 mL via INTRAVENOUS

## 2013-09-22 MED ORDER — ONDANSETRON HCL 4 MG/2ML IJ SOLN: 4.0000 mg | INTRAMUSCULAR | Status: DC | PRN

## 2013-09-22 SURGICAL SUPPLY — 20 items
BAG URINE DRAINAGE (UROLOGICAL SUPPLIES) ×2 IMPLANT
BAG URO CATCHER STRL LF (DRAPE) ×2 IMPLANT
CATH FOLEY 3WAY 30CC 22FR (CATHETERS) ×2 IMPLANT
DRAPE CAMERA CLOSED 9X96 (DRAPES) ×2 IMPLANT
ELECT BUTTON HF 24-28F 2 30DE (ELECTRODE) ×1 IMPLANT
ELECT LOOP MED HF 24F 12D (CUTTING LOOP) ×1 IMPLANT
ELECT LOOP MED HF 24F 12D CBL (CLIP) ×2 IMPLANT
ELECT RESECT VAPORIZE 12D CBL (ELECTRODE) ×1 IMPLANT
GLOVE BIOGEL M STRL SZ7.5 (GLOVE) ×2 IMPLANT
GOWN STRL REIN XL XLG (GOWN DISPOSABLE) ×2 IMPLANT
HOLDER FOLEY CATH W/STRAP (MISCELLANEOUS) ×1 IMPLANT
IV NS IRRIG 3000ML ARTHROMATIC (IV SOLUTION) ×2 IMPLANT
KIT ASPIRATION TUBING (SET/KITS/TRAYS/PACK) IMPLANT
MANIFOLD NEPTUNE II (INSTRUMENTS) ×2 IMPLANT
PACK CYSTO (CUSTOM PROCEDURE TRAY) ×2 IMPLANT
PLUG CATH AND CAP STER (CATHETERS) ×1 IMPLANT
SUT ETHILON 3 0 PS 1 (SUTURE) IMPLANT
SYR 30ML LL (SYRINGE) ×1 IMPLANT
SYRINGE IRR TOOMEY STRL 70CC (SYRINGE) ×2 IMPLANT
TUBING CONNECTING 10 (TUBING) ×2 IMPLANT

## 2013-09-22 NOTE — H&P (Signed)
Kevin Fitzgerald is an 52 y.o. male.    Chief Complaint: Pre-Op Transurethral Resection of Prostate  HPI:     1 - Urinary Retention / LUTS - Pt with long h/o progressive obstructive LUTS culminating in frank retention 06/2013. Had ER CT showed massive distended bladder up to chest, mild blat hydro and 98gm volume prostate with trilobar hypertrophy, Cr 1.08. Foley placed in office 06/2013 and began tamsulosin + finasteride. Failed office trial of void 07/2013, and again 08/2013. UDS confirmed obstruction with preserved bladder function with Qm 0 at PDet 140 and normal sensation.  2 - Erectile Dysfunciton - pt also with progressive ED x several years. Libido preserved.  3 - Prostate Screening - No FHX prostae cancer 06/2013 - DRE >100gm smooth, PSA 0.49  4 - Meatal Stenosis - Incidetnal on exam in ER 06/2013, preculuded catheter placemetn previously, easily dilated to 69F in office.  PMH sig for Anxiety/Depression, Asthma, HTN. No CV disease. No prior surgeries.   Today Kevin Fitzgerald is seen to proceed with TURP for goal of catheter free for his retractory urinary retention. He has been on Cipro for serratia colonization by most recent UCX, no interval fevers or foley problems.   Past Medical History  Diagnosis Date  . Hypertension   . Asthma     as child  . Arthritis     knee    No past surgical history on file.  No family history on file. Social History:  reports that he has been smoking Cigarettes.  He has a 15 pack-year smoking history. He does not have any smokeless tobacco history on file. He reports that he drinks alcohol. He reports that he does not use illicit drugs.  Allergies: No Known Allergies  No prescriptions prior to admission    No results found for this or any previous visit (from the past 48 hour(s)). No results found.  Review of Systems  Constitutional: Negative.  Negative for fever and chills.  HENT: Negative.   Eyes: Negative.   Respiratory: Negative.    Cardiovascular: Negative.   Gastrointestinal: Negative.   Genitourinary:       Urinary retention  Musculoskeletal: Negative.   Skin: Negative.   Neurological: Negative.   Endo/Heme/Allergies: Negative.   Psychiatric/Behavioral: Negative.     There were no vitals taken for this visit. Physical Exam  Constitutional: He is oriented to person, place, and time. He appears well-developed and well-nourished.  HENT:  Head: Normocephalic and atraumatic.  Eyes: EOM are normal. Pupils are equal, round, and reactive to light.  Neck: Normal range of motion. Neck supple.  Cardiovascular: Normal rate.   Respiratory: Effort normal.  GI: Soft. Bowel sounds are normal.  Genitourinary: Penis normal.  Foley c/d/i with clear urine.  Musculoskeletal: Normal range of motion.  Neurological: He is alert and oriented to person, place, and time.  Skin: Skin is warm and dry.  Psychiatric: He has a normal mood and affect. His behavior is normal. Judgment and thought content normal.     Assessment/Plan  1 - Urinary Retention / LUTS - Impressive outlet obstruction from likely BPH.   We rediscussed options for medical refractory prostatic outlet obstruction including TURP, TUNA, TUMT, Green-Light, Ho-LEP, and simple prostatectomy with their respective risks, benefits, and long-term outcomes data. Pt has opted for TURP. We rediscussed the typical peri-operative course with overnight admission and discharge with foley in place with subsequent office voiding trial few days later. We rediscussed risks including bleeding, infection, incontinence, need for repeat procedures /  tissue regrowth over time as well as rare risks including DVT, PE, MI, CVA and mortality.    2 - Erectile Dysfunciton - defer furhter eval until retention resolved.  3 - Prostate Screening - Up to date this year, continue annual screening.  4 - Meatal Stenosis - now dilated. Likely not source of obstruction.  Has been on Cipro proph  pre-op, will receive Natasha Bence today based on most recent CX.   Marin Wisner 09/22/2013, 6:42 AM

## 2013-09-22 NOTE — Transfer of Care (Signed)
Immediate Anesthesia Transfer of Care Note  Patient: Kevin Fitzgerald  Procedure(s) Performed: Procedure(s) (LRB): TRANSURETHRAL RESECTION OF THE PROSTATE WITH GYRUS INSTRUMENTS (N/A)  Patient Location: PACU  Anesthesia Type: General  Level of Consciousness: sedated, patient cooperative and responds to stimulation  Airway & Oxygen Therapy: Patient Spontanous Breathing and Patient connected to face mask oxgen  Post-op Assessment: Report given to PACU RN and Post -op Vital signs reviewed and stable  Post vital signs: Reviewed and stable  Complications: No apparent anesthesia complications

## 2013-09-22 NOTE — Brief Op Note (Signed)
09/22/2013  1:59 PM  PATIENT:  Kevin Fitzgerald  52 y.o. male  PRE-OPERATIVE DIAGNOSIS:  BENIGN PROSTATIC HYPERTROPHY, RETENTION  POST-OPERATIVE DIAGNOSIS:  BENIGN PROSTATIC HYPERTROPHY, RETENTION  PROCEDURE:  Procedure(s): TRANSURETHRAL RESECTION OF THE PROSTATE WITH GYRUS INSTRUMENTS (N/A)  SURGEON:  Surgeon(s) and Role:    * Sebastian Ache, MD - Primary  PHYSICIAN ASSISTANT:   ASSISTANTS: none   ANESTHESIA:   general  EBL:  Total I/O In: 1000 [I.V.:1000] Out: 275 [Urine:275]  BLOOD ADMINISTERED:none  DRAINS: 67F 3 way foley to NS CBI   LOCAL MEDICATIONS USED:  NONE  SPECIMEN:  Source of Specimen:  Prostate Chips  DISPOSITION OF SPECIMEN:  PATHOLOGY  COUNTS:  YES  TOURNIQUET:  * No tourniquets in log *  DICTATION: .Other Dictation: Dictation Number [id number]  PLAN OF CARE: Admit for overnight observation  PATIENT DISPOSITION:  PACU - hemodynamically stable.   Delay start of Pharmacological VTE agent (>24hrs) due to surgical blood loss or risk of bleeding: yes

## 2013-09-22 NOTE — Anesthesia Postprocedure Evaluation (Signed)
  Anesthesia Post-op Note  Patient: Kevin Fitzgerald  Procedure(s) Performed: Procedure(s) (LRB): TRANSURETHRAL RESECTION OF THE PROSTATE WITH GYRUS INSTRUMENTS (N/A)  Patient Location: PACU  Anesthesia Type: General  Level of Consciousness: awake and alert   Airway and Oxygen Therapy: Patient Spontanous Breathing  Post-op Pain: mild  Post-op Assessment: Post-op Vital signs reviewed, Patient's Cardiovascular Status Stable, Respiratory Function Stable, Patent Airway and No signs of Nausea or vomiting  Last Vitals:  Filed Vitals:   09/22/13 1509  BP: 135/82  Pulse: 56  Temp: 36.3 C  Resp: 16    Post-op Vital Signs: stable   Complications: No apparent anesthesia complications

## 2013-09-22 NOTE — Preoperative (Signed)
Beta Blockers   Reason not to administer Beta Blockers:Not Applicable 

## 2013-09-22 NOTE — Anesthesia Preprocedure Evaluation (Addendum)
Anesthesia Evaluation  Patient identified by MRN, date of birth, ID band Patient awake    Reviewed: Allergy & Precautions, H&P , NPO status , Patient's Chart, lab work & pertinent test results  Airway Mallampati: II TM Distance: >3 FB Neck ROM: Full    Dental no notable dental hx.    Pulmonary Current Smoker,  breath sounds clear to auscultation  Pulmonary exam normal       Cardiovascular hypertension, Rhythm:Regular Rate:Normal     Neuro/Psych negative neurological ROS  negative psych ROS   GI/Hepatic negative GI ROS, Neg liver ROS,   Endo/Other  negative endocrine ROS  Renal/GU negative Renal ROS  negative genitourinary   Musculoskeletal negative musculoskeletal ROS (+)   Abdominal   Peds negative pediatric ROS (+)  Hematology negative hematology ROS (+)   Anesthesia Other Findings   Reproductive/Obstetrics negative OB ROS                          Anesthesia Physical Anesthesia Plan  ASA: II  Anesthesia Plan: General   Post-op Pain Management:    Induction: Intravenous  Airway Management Planned: LMA  Additional Equipment:   Intra-op Plan:   Post-operative Plan: Extubation in OR  Informed Consent: I have reviewed the patients History and Physical, chart, labs and discussed the procedure including the risks, benefits and alternatives for the proposed anesthesia with the patient or authorized representative who has indicated his/her understanding and acceptance.   Dental advisory given  Plan Discussed with: CRNA  Anesthesia Plan Comments:         Anesthesia Quick Evaluation

## 2013-09-23 ENCOUNTER — Encounter (HOSPITAL_COMMUNITY): Payer: Self-pay | Admitting: Urology

## 2013-09-23 MED ORDER — OXYCODONE-ACETAMINOPHEN 5-325 MG PO TABS: 1.0000 | ORAL_TABLET | ORAL | Status: DC | PRN

## 2013-09-23 MED ORDER — SENNOSIDES-DOCUSATE SODIUM 8.6-50 MG PO TABS: 1.0000 | ORAL_TABLET | Freq: Two times a day (BID) | ORAL | Status: DC

## 2013-09-23 MED ORDER — CIPROFLOXACIN HCL 500 MG PO TABS: 500.0000 mg | ORAL_TABLET | Freq: Two times a day (BID) | ORAL | Status: DC

## 2013-09-23 NOTE — Op Note (Signed)
NAMEILAN, KAHRS NO.:  [id number]  MEDICAL RECORD NO.:  [id number]  LOCATION:  1437                         FACILITY:  [facility]  PHYSICIAN:  Sebastian Ache, MD     DATE OF BIRTH:  1961-04-02  DATE OF PROCEDURE: 09/22/2013 DATE OF DISCHARGE:                              OPERATIVE REPORT   DIAGNOSIS:  Trilobar prostatic hypertrophy with refractory urinary retention.  PROCEDURE:  Transurethral resection of the prostate.  ESTIMATED BLOOD LOSS:  100 mL.  COMPLICATIONS:  None.  SPECIMENS:  Prostate chips for pathology.  FINDINGS: 1. Massive trilobar prostatic hypertrophy and the majority of the     adenoma in the median lobe. 2. Visualization of ureteral orifices following resection, and     uninjured.  DRAINS:  A 22-French 3-way Foley catheter to normal saline irrigation 1 drop per second, efflux light pink.  INDICATION:  Mr. Sprinkle is a very pleasant 52 year old gentleman with history of obstructive urinary symptoms, culminating in frank urinary retention this year.  He underwent evaluation with urodynamics revealed a high pressure low flow voiding state and cystoscopy which revealed a trilobar hypertrophy and his symptoms were medically refractory having been on alpha blockers and 5-alpha reductase inhibitors for quite some time and continued good fill and trial of void.  Options were discussed including simple prostatectomy versus transurethral resection of the prostate and he adamantly wished to proceed with that latter.  Informed consent was obtained and placed in medical record.  PROCEDURE IN DETAIL:  The patient being, Bazil Dhanani, verified. Procedure being transurethral resection of the prostate was confirmed. Procedure was carried out.  Time-out was performed.  Intravenous antibiotics were administered.  General LMA anesthesia was introduced. The patient was placed into a low lithotomy position.  Sterile field was created by prepping and  draping the patient's penis, perineum, proximal thighs using iodine x3.  Next, cystourethroscopy was performed using a 26-French resectoscope sheath with visual obturator.  Inspection of the anterior and posterior urethra revealed massive trilobar prostatic hypertrophy with majority of the adenoma in the median lobe.  Ureteral orifices in the normal anatomic position and brought away from the adenoma.  Next, the bipolar resectoscope was used to perform very careful resection of the median lobe down and flushed to the posterior bladder wall.  Next, superior dissection was performed at 12 o'clock from a base towards the apex of the prostate taking great care not to resect distal to verumontanum.  Next, the left prostatic lobe was resected from the base to apex of the prostate, as was the right taking down to what appeared to be the circular fibers of the prostatic capsule.  Great care was taken to not to resect distal to verumontanum, this did not occur.  All prostate chips were set aside and irrigated and final inspection revealed excellent hemostasis.  No evidence of bladder injury.  No evidence of injury to the ureteral orifices.  Following resectoscope was exchanged for a new 22-French 3-way Foley catheter, 30 mL of sterile water in the balloon with normal saline irrigation of 1 drop per second.  The efflux was light pink.  Procedure was then terminated.  The patient tolerated the procedure well.  There were no immediate periprocedural complications.  The patient was taken to the postanesthesia care unit in stable condition.          ______________________________ Sebastian Ache, MD     TM/MEDQ  D:  09/22/2013  T:  09/23/2013  Job:  [id number]

## 2013-09-23 NOTE — Progress Notes (Signed)
Patient discharge to home wife at bedside. Patient in a  hurry to go home, changed his foley bag to leg bag by himself stating I've been doing it by myself. Rn Still went over with the patient with discharge teaching. PIV removed no s/s of infiltration or swelling. D/c ambulatory no complaints of any pains or discomfort upon discharged.

## 2013-09-23 NOTE — Discharge Summary (Signed)
Physician Discharge Summary  Patient ID: Kevin Fitzgerald MRN: [id number] DOB/AGE: 07/15/1961 52 y.o.  Admit date: 09/22/2013 Discharge date: 09/23/2013  Admission Diagnoses: Prostatic Hypertrophy with Urinary Retention  Discharge Diagnoses: Prostatic Hypertrophy with Urinary Retention  Discharged Condition: good  Hospital Course:   1 - Urinary Retention / LUTS - Pt underwent transurethral resection of the prostate on 09/22/13, the day of admission, without acute complications. He was admitted overnight for observation. By POD1, the day of discharge, he was ambulatory, tollerating regular diet, pain controlled, and felt to be adequate for discharge. His bladder irrigation had been weaned ot off and Hgb stable at >12.   Consults: None  Significant Diagnostic Studies: labs: Hgb 12. Path pending Treatments: surgery: transurethral resection of the prostate on 09/22/13  Discharge Exam: Blood pressure 101/58, pulse 65, temperature 98.6 F (37 C), temperature source Oral, resp. rate 16, height 5\' 9"  (1.753 m), weight 70.217 kg (154 lb 12.8 oz), SpO2 97.00%. General appearance: alert, cooperative, appears stated age and wife at bedside Head: Normocephalic, without obvious abnormality, atraumatic Eyes: conjunctivae/corneas clear. PERRL, EOM's intact. Fundi benign. Ears: normal TM's and external ear canals both ears Nose: Nares normal. Septum midline. Mucosa normal. No drainage or sinus tenderness. Throat: lips, mucosa, and tongue normal; teeth and gums normal Neck: no adenopathy, no carotid bruit, no JVD, supple, symmetrical, trachea midline and thyroid not enlarged, symmetric, no tenderness/mass/nodules Back: symmetric, no curvature. ROM normal. No CVA tenderness. Resp: clear to auscultation bilaterally Chest wall: no tenderness Cardio: regular rate and rhythm, S1, S2 normal, no murmur, click, rub or gallop GI: soft, non-tender; bowel sounds normal; no masses,  no organomegaly Male  genitalia: normal, foley c/d/i with clear urine off irrigation.  Extremities: extremities normal, atraumatic, no cyanosis or edema Pulses: 2+ and symmetric Skin: Skin color, texture, turgor normal. No rashes or lesions Lymph nodes: Cervical, supraclavicular, and axillary nodes normal. Neurologic: Grossly normal  Disposition: 01-Home or Self Care     Medication List         ciprofloxacin 500 MG tablet  Commonly known as:  CIPRO  Take 1 tablet (500 mg total) by mouth 2 (two) times daily. X 3 days. Begin day before next urology appointment     finasteride 5 MG tablet  Commonly known as:  PROSCAR  Take 5 mg by mouth daily.     oxyCODONE-acetaminophen 5-325 MG per tablet  Commonly known as:  ROXICET  Take 1 tablet by mouth every 4 (four) hours as needed for moderate pain or severe pain. Post-operatively.     senna-docusate 8.6-50 MG per tablet  Commonly known as:  SENOKOT S  Take 1 tablet by mouth 2 (two) times daily. While taking pain meds to prevent constipation.           Follow-up Information   Follow up with Sebastian Ache, MD On 09/27/2013. (at 10 AM for MD visit and catheter removal)    Specialty:  Urology   Contact information:   [address] [place] [address] [phone]       Signed: Sebastian Ache 09/23/2013, 6:42 AM

## 2013-11-18 ENCOUNTER — Emergency Department (INDEPENDENT_AMBULATORY_CARE_PROVIDER_SITE_OTHER)
Admission: EM | Admit: 2013-11-18 | Discharge: 2013-11-18 | Disposition: A | Discharge status: Home / Self Care | Payer: 59 | Source: Home / Self Care | Attending: Family Medicine | Admitting: Family Medicine

## 2013-11-18 ENCOUNTER — Encounter (HOSPITAL_COMMUNITY): Payer: Self-pay | Admitting: Emergency Medicine

## 2013-11-18 DIAGNOSIS — M75100 Unspecified rotator cuff tear or rupture of unspecified shoulder, not specified as traumatic: Secondary | ICD-10-CM

## 2013-11-18 DIAGNOSIS — IMO0002 Reserved for concepts with insufficient information to code with codable children: Secondary | ICD-10-CM

## 2013-11-18 DIAGNOSIS — M7550 Bursitis of unspecified shoulder: Secondary | ICD-10-CM

## 2013-11-18 MED ORDER — TRAMADOL HCL 50 MG PO TABS: 50.0000 mg | ORAL_TABLET | Freq: Four times a day (QID) | ORAL | Status: DC | PRN

## 2013-11-18 MED ORDER — METHYLPREDNISOLONE ACETATE 40 MG/ML IJ SUSP
40.0000 mg | Freq: Once | INTRAMUSCULAR | Status: AC
  Administered 2013-11-18: 40 mg via INTRA_ARTICULAR

## 2013-11-18 MED ORDER — METHYLPREDNISOLONE ACETATE 80 MG/ML IJ SUSP
INTRAMUSCULAR | Status: AC
  Filled 2013-11-18: qty 1

## 2013-11-18 MED ORDER — DICLOFENAC SODIUM 75 MG PO TBEC: 75.0000 mg | DELAYED_RELEASE_TABLET | Freq: Two times a day (BID) | ORAL | Status: DC | PRN

## 2013-11-18 NOTE — Discharge Instructions (Signed)
Thank you for coming in today. Followup of [facility] Take diclofenac twice daily for pain. You are okay to work while taking this medication. Use tramadol for severe pain. Did not work after taking this medication. Do the exercises listed below

## 2013-11-18 NOTE — ED Provider Notes (Signed)
Kevin SpellerHarry B Fitzgerald is a 53 y.o. male who presents to Urgent Care today for 3 weeks of left shoulder pain without any injury. 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 He denies any significant radiating pain weakness or numbness. He notes pain is moderate to severe and worse with overhead activity. He notes popping and shoulder but denies any locking. He has tried some over-the-counter medications which have not been very effective.   Past Medical History  Diagnosis Date  . Hypertension   . Asthma     as child  . Arthritis     knee   History  Substance Use Topics  . Smoking status: Current Every Day Smoker -- 0.50 packs/day for 30 years    Types: Cigarettes  . Smokeless tobacco: Not on file  . Alcohol Use: Yes     Comment: occassionally   ROS as above Medications: No current facility-administered medications for this encounter.   Current Outpatient Prescriptions  Medication Sig Dispense Refill  . diclofenac (VOLTAREN) 75 MG EC tablet Take 1 tablet (75 mg total) by mouth 2 (two) times daily as needed.  60 tablet  0  . finasteride (PROSCAR) 5 MG tablet Take 5 mg by mouth daily.      . traMADol (ULTRAM) 50 MG tablet Take 1 tablet (50 mg total) by mouth every 6 (six) hours as needed.  15 tablet  0    Exam:  BP 126/74  Pulse 76  Temp(Src) 98.1 F (36.7 C) (Oral)  Resp 14  SpO2 100% Gen: Well NAD Shoulder: Right Inspection reveals no abnormalities, atrophy or asymmetry. Palpation : Tender palpation over bicipital groove nontender a.c. joint ROM is limited in abduction to 120 degrees active and 150 passive. External and internal rotation range of motion are normal Rotator cuff strength normal throughout. Positive Neer's and Hawkins test. Positive empty can test Speeds and Yergason's tests normal. Normal scapular function observed.  Shoulder: Left Inspection reveals no abnormalities, atrophy or asymmetry. Palpation is normal with no  tenderness over AC joint or bicipital groove. ROM is full in all planes. Rotator cuff strength normal throughout. No signs of impingement with negative Neer and Hawkin's tests, empty can sign. Speeds and Yergason's tests normal. No labral pathology noted with negative Obrien's, negative clunk and good stability. Normal scapular function observed. No painful arc and no drop arm sign. No apprehension sign  Bilateral upper extremities sensation capillary refill and grip strength are intact.   Limited musculoskeletal ultrasound of the left shoulder:  Biceps tendon is intact and normal appearing and in the groove on both short and long views.  Subscapularis tendon is normal-appearing Supraspinatus tendon is normal-appearing with no tears. However subacromial bursa is enlarged/ Infraspinatus is normal-appearing A.c. joint is normal-appearing. Conclusion: No identified rotator cuff tears. Subacromial bursitis present  Subacromial Injection: Left Consent obtained and time out performed.  Area cleaned with alcohol.  40Mg  of depomedrol and 3 mL of lidocaine was injected into the subacromial bursa without complication or bleeding. Patient tolerated the procedure well.   Patient had significant resolution of pain following injection    Assessment and Plan: 53 y.o. male with left subacromial bursitis. Doubtful for rotator cuff tear. Differential diagnosis also includes labrum injury. Patient had improvement in symptoms with the lidocaine component of a subacromial injection. I am hopeful that the Depo-Medrol component well alleviated his pain long term. Additionally use home exercise program diclofenac and tramadol for pain control. If not improved recommend  followup with [facility] for MRI arthrogram. He expresses understanding and agreement.  Discussed warning signs or symptoms. Please see discharge instructions. Patient expresses understanding.    Rodolph Bong, MD 11/18/13  [phone]

## 2013-11-18 NOTE — ED Notes (Signed)
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

## 2014-04-03 ENCOUNTER — Emergency Department (INDEPENDENT_AMBULATORY_CARE_PROVIDER_SITE_OTHER)
Admission: EM | Admit: 2014-04-03 | Discharge: 2014-04-03 | Disposition: A | Discharge status: Home / Self Care | Payer: 59 | Source: Home / Self Care | Attending: Family Medicine | Admitting: Family Medicine

## 2014-04-03 ENCOUNTER — Encounter (HOSPITAL_COMMUNITY): Payer: Self-pay | Admitting: Emergency Medicine

## 2014-04-03 DIAGNOSIS — K2920 Alcoholic gastritis without bleeding: Secondary | ICD-10-CM

## 2014-04-03 LAB — COMPREHENSIVE METABOLIC PANEL
ALT: 16 U/L (ref 0–53)
AST: 19 U/L (ref 0–37)
Albumin: 3.6 g/dL (ref 3.5–5.2)
Alkaline Phosphatase: 80 U/L (ref 39–117)
BUN: 19 mg/dL (ref 6–23)
CALCIUM: 10.1 mg/dL (ref 8.4–10.5)
CO2: 29 mEq/L (ref 19–32)
CREATININE: 0.84 mg/dL (ref 0.50–1.35)
Chloride: 101 mEq/L (ref 96–112)
GFR calc Af Amer: >90 mL/min (ref >90)
Glucose, Bld: 83 mg/dL (ref 70–99)
Potassium: 4.7 mEq/L (ref 3.7–5.3)
Sodium: 140 mEq/L (ref 137–147)
TOTAL PROTEIN: 7.4 g/dL (ref 6.0–8.3)
Total Bilirubin: 0.3 mg/dL (ref 0.3–1.2)

## 2014-04-03 LAB — CBC WITH DIFFERENTIAL/PLATELET
BASOS ABS: 0.1 10*3/uL (ref 0.0–0.1)
Basophils Relative: 1 % (ref 0–1)
EOS ABS: 0.6 10*3/uL (ref 0.0–0.7)
Eosinophils Relative: 6 % — ABNORMAL HIGH (ref 0–5)
HCT: 39.9 % (ref 39.0–52.0)
Hemoglobin: 13.3 g/dL (ref 13.0–17.0)
Lymphocytes Relative: 35 % (ref 12–46)
Lymphs Abs: 3.3 10*3/uL (ref 0.7–4.0)
MCH: 29.7 pg (ref 26.0–34.0)
MCHC: 33.3 g/dL (ref 30.0–36.0)
MCV: 89.1 fL (ref 78.0–100.0)
Monocytes Absolute: 0.8 10*3/uL (ref 0.1–1.0)
Monocytes Relative: 8 % (ref 3–12)
Neutro Abs: 4.8 10*3/uL (ref 1.7–7.7)
Neutrophils Relative %: 50 % (ref 43–77)
PLATELETS: 178 10*3/uL (ref 150–400)
RBC: 4.48 MIL/uL (ref 4.22–5.81)
RDW: 12.7 % (ref 11.5–15.5)
WBC: 9.5 10*3/uL (ref 4.0–10.5)

## 2014-04-03 LAB — AMYLASE: Amylase: 76 U/L (ref 0–105)

## 2014-04-03 LAB — LIPASE, BLOOD: LIPASE: 51 U/L (ref 11–59)

## 2014-04-03 MED ORDER — PANTOPRAZOLE SODIUM 40 MG PO TBEC
40.0000 mg | DELAYED_RELEASE_TABLET | Freq: Every day | ORAL | Status: DC
Start: 2014-04-03 — End: 2015-02-23

## 2014-04-03 MED ORDER — GI COCKTAIL ~~LOC~~
30.0000 mL | Freq: Once | ORAL | Status: AC
  Administered 2014-04-03: 30 mL via ORAL

## 2014-04-03 MED ORDER — GI COCKTAIL ~~LOC~~
ORAL | Status: AC
  Filled 2014-04-03: qty 30

## 2014-04-03 NOTE — ED Provider Notes (Signed)
CSN: [id number]     Arrival date & time 04/03/14  1201 History   First MD Initiated Contact with Patient 04/03/14 1227     Chief Complaint  Patient presents with  . Abdominal Pain   (Consider location/radiation/quality/duration/timing/severity/associated sxs/prior Treatment) Patient is a 53 y.o. male presenting with abdominal pain. The history is provided by the patient and the spouse.  Abdominal Pain Pain location:  Epigastric Pain quality: burning and gnawing   Pain radiates to:  Does not radiate Pain severity:  Moderate Onset quality:  Sudden Progression:  Worsening Chronicity:  New Context: alcohol use   Context comment:  Recent stress, smoker Exacerbated by: etoh. Associated symptoms: no diarrhea, no fever, no hematemesis, no hematochezia, no nausea and no vomiting     Past Medical History  Diagnosis Date  . Hypertension   . Asthma     as child  . Arthritis     knee   Past Surgical History  Procedure Laterality Date  . Transurethral resection of prostate N/A 09/22/2013    Procedure: TRANSURETHRAL RESECTION OF THE PROSTATE WITH GYRUS INSTRUMENTS;  Surgeon: Sebastian Ache, MD;  Location: [facility];  Service: Urology;  Laterality: N/A;   History reviewed. No pertinent family history. History  Substance Use Topics  . Smoking status: Current Every Day Smoker -- 0.50 packs/day for 30 years    Types: Cigarettes  . Smokeless tobacco: Not on file  . Alcohol Use: Yes     Comment: occassionally    Review of Systems  Constitutional: Negative.  Negative for fever.  Gastrointestinal: Positive for abdominal pain. Negative for nausea, vomiting, diarrhea, blood in stool, hematochezia and hematemesis.    Allergies  Review of patient's allergies indicates no known allergies.  Home Medications   Prior to Admission medications   Medication Sig Start Date End Date Taking? Authorizing Provider  diclofenac (VOLTAREN) 75 MG EC tablet Take 1 tablet (75 mg total) by mouth 2 (two)  times daily as needed. 11/18/13   Rodolph Bong, MD  finasteride (PROSCAR) 5 MG tablet Take 5 mg by mouth daily.    Historical Provider, MD  pantoprazole (PROTONIX) 40 MG tablet Take 1 tablet (40 mg total) by mouth daily. 04/03/14   Linna Hoff, MD  traMADol (ULTRAM) 50 MG tablet Take 1 tablet (50 mg total) by mouth every 6 (six) hours as needed. 11/18/13   Rodolph Bong, MD   BP 117/74  Pulse 58  Temp(Src) 97.9 F (36.6 C) (Oral)  Resp 18  SpO2 100% Physical Exam  Nursing note and vitals reviewed. Constitutional: He is oriented to person, place, and time. He appears well-developed and well-nourished.  HENT:  Mouth/Throat: Oropharynx is clear and moist.  Neck: Normal range of motion. Neck supple.  Abdominal: Soft. Bowel sounds are normal. He exhibits no distension and no mass. There is no splenomegaly or hepatomegaly. There is tenderness in the epigastric area. There is no rigidity, no rebound, no guarding and no CVA tenderness.  Lymphadenopathy:    He has no cervical adenopathy.  Neurological: He is alert and oriented to person, place, and time.  Skin: Skin is warm and dry.    ED Course  Procedures (including critical care time) Labs Review Labs Reviewed  CBC WITH DIFFERENTIAL - Abnormal; Notable for the following:    Eosinophils Relative 6 (*)    All other components within normal limits  LIPASE, BLOOD  AMYLASE  COMPREHENSIVE METABOLIC PANEL   Labs wnl. Imaging Review No results found.  MDM   1. Gastritis due to alcohol without hemorrhage    Sx much improved at time of d/c.    Linna HoffJames D Raye Slyter, MD 04/03/14 1444

## 2014-04-03 NOTE — ED Notes (Signed)
Points to center, epigastric area as location of pain.  Patient denies nausea, denies vomiting, denies constipation or diarrhea.  Last bm yesterday-normal , and did not change abdominal pain.  Epigastric pain is intermittent.  Tried tums with minimal impact to quality of pain

## 2014-06-26 ENCOUNTER — Encounter (HOSPITAL_COMMUNITY): Payer: Self-pay | Admitting: Emergency Medicine

## 2014-06-26 ENCOUNTER — Emergency Department (HOSPITAL_COMMUNITY)
Admission: EM | Admit: 2014-06-26 | Discharge: 2014-06-26 | Disposition: A | Discharge status: Home / Self Care | Payer: 59 | Attending: Emergency Medicine | Admitting: Emergency Medicine

## 2014-06-26 DIAGNOSIS — K0381 Cracked tooth: Secondary | ICD-10-CM | POA: Diagnosis not present

## 2014-06-26 DIAGNOSIS — K089 Disorder of teeth and supporting structures, unspecified: Secondary | ICD-10-CM | POA: Diagnosis present

## 2014-06-26 DIAGNOSIS — J45909 Unspecified asthma, uncomplicated: Secondary | ICD-10-CM | POA: Insufficient documentation

## 2014-06-26 DIAGNOSIS — M1710 Unilateral primary osteoarthritis, unspecified knee: Secondary | ICD-10-CM | POA: Insufficient documentation

## 2014-06-26 DIAGNOSIS — Z792 Long term (current) use of antibiotics: Secondary | ICD-10-CM | POA: Diagnosis not present

## 2014-06-26 DIAGNOSIS — F17200 Nicotine dependence, unspecified, uncomplicated: Secondary | ICD-10-CM | POA: Diagnosis not present

## 2014-06-26 DIAGNOSIS — I10 Essential (primary) hypertension: Secondary | ICD-10-CM | POA: Insufficient documentation

## 2014-06-26 DIAGNOSIS — Z79899 Other long term (current) drug therapy: Secondary | ICD-10-CM | POA: Diagnosis not present

## 2014-06-26 DIAGNOSIS — K0889 Other specified disorders of teeth and supporting structures: Secondary | ICD-10-CM

## 2014-06-26 DIAGNOSIS — IMO0002 Reserved for concepts with insufficient information to code with codable children: Secondary | ICD-10-CM

## 2014-06-26 MED ORDER — HYDROCODONE-ACETAMINOPHEN 5-325 MG PO TABS
1.0000 | ORAL_TABLET | Freq: Once | ORAL | Status: AC
  Administered 2014-06-26: 1 via ORAL
  Filled 2014-06-26: qty 1

## 2014-06-26 MED ORDER — OXYCODONE-ACETAMINOPHEN 5-325 MG PO TABS
2.0000 | ORAL_TABLET | Freq: Once | ORAL | Status: AC
  Administered 2014-06-26: 2 via ORAL
  Filled 2014-06-26: qty 2

## 2014-06-26 MED ORDER — OXYCODONE-ACETAMINOPHEN 5-325 MG PO TABS: 2.0000 | ORAL_TABLET | ORAL | Status: DC | PRN

## 2014-06-26 MED ORDER — PENICILLIN V POTASSIUM 500 MG PO TABS: 500.0000 mg | ORAL_TABLET | Freq: Four times a day (QID) | ORAL | Status: AC

## 2014-06-26 NOTE — Discharge Instructions (Signed)
Keep your regularly scheduled appointment with your dentist for Tuesday and follow up with your PCP. Take Penicillin to cover for potential tooth infection Percocet for pain management Return to ED if you experience fevers, difficulty breathing or speaking.   Emergency Department Resource Guide 1) Find a Doctor and Pay Out of Pocket Although you won't have to find out who is covered by your insurance plan, it is a good idea to ask around and get recommendations. You will then need to call the office and see if the doctor you have chosen will accept you as a new patient and what types of options they offer for patients who are self-pay. Some doctors offer discounts or will set up payment plans for their patients who do not have insurance, but you will need to ask so you aren't surprised when you get to your appointment.  2) Contact Your Local Health Department Not all health departments have doctors that can see patients for sick visits, but many do, so it is worth a call to see if yours does. If you don't know where your local health department is, you can check in your phone book. The CDC also has a tool to help you locate your state's health department, and many state websites also have listings of all of their local health departments.  3) Find a Walk-in Clinic If your illness is not likely to be very severe or complicated, you may want to try a walk in clinic. These are popping up all over the country in pharmacies, drugstores, and shopping centers. They're usually staffed by nurse practitioners or physician assistants that have been trained to treat common illnesses and complaints. They're usually fairly quick and inexpensive. However, if you have serious medical issues or chronic medical problems, these are probably not your best option.  No Primary Care Doctor: - Call Health Connect at  [phone] - they can help you locate a primary care doctor that  accepts your insurance, provides certain  services, etc. - Physician Referral Service- [phone]  Chronic Pain Problems: Organization         Address  Phone   Notes  [facility]  [phone] Patients need to be referred by their primary care doctor.   Medication Assistance: Organization         Address  Phone   Notes  [facility] Med[facility] [address] [place] [place], [place] [address] [phone] --Must be a resident of [facility] -- Must have NO insurance coverage whatsoever (no Medicaid/ Medicare, etc.) -- The pt. MUST have a primary care doctor that directs their care regularly and follows them in the community   Med[facility]ist  [phone]   [employer]  [phone]    Agencies that provide inexpensive medical care: Organization         Address  Phone   Notes  [facility]  [phone]   [facility]    [phone]   [facility] [address] [place], [place] [address] [phone]   [facility] [place] [address] [place], [place] [phone]   Planned Parenthood    [phone]   [facility]    [phone]   [facility] and [facility]  [address], [place] Phone:  [phone], Fax:  [phone] Hours of Operation:  9 am - 6 pm, M-F.  Also accepts Medicaid/Medicare and self-pay.  [facility] for [place], [address], [place] Phone: [phone], Fax: [phone]. Hours of Operation:  8:30 am - 5:30 pm, M-F.  Also accepts Medicaid and self-pay.  [facility] [address] [place] Phone: [phone]   [place], [place], [place] [phone], Ext. [phone] Mondays & Thursdays: 7-9 AM.  First 15 patients are seen on a first come, first serve basis.    [place] Providers:  Organization         Address  Phone   Notes  [facility] [address], [place] [phone] Also accepts self-pay patients.  [facility] [address], [place]  [phone]   [place], [address], [place] [phone]   [facility] [address], [place] [phone]   Lucianne Lei [address], [place]   [phone] Only accepts [place] Access [place] patients after they have their name applied to their card.   Self-Pay (no insurance) in [facility]:  Organization         Address  Phone   Notes  Sickle Cell Patients, [facility] [place] [phone]   [facility] [place] [phone]   [facility] [place]  [place], [place],  [phone]   [facility]/Dr. Osei-Bonsu  [address] [place] or [address], [place] [phone] Phone number for both [place] and [place] locations is the same.  Urgent Medical and [facility] [address], [place] [phone]   [facility] [address] [place] or [address] [phone]   [facility] [address], [place] [phone], phone; [phone], fax Sees patients 1st and 3rd Saturday of every month.  Must not qualify for public or private insurance (i.e. Medicaid, Medicare, [place] Health Choice, Veterans' Benefits)  Household income should be no more than 200% of the poverty level The clinic cannot treat you if you are pregnant or think you are pregnant  Sexually transmitted diseases are not treated at the clinic.    Dental Care: Organization         Address  Phone  Notes  [facility] Department of [facility] [place] [phone] Accepts  children up to age 58 who are enrolled in [place] or [place]; pregnant women with a Medicaid card; and children who have applied for Medicaid or [place] Health Choice, but were declined, whose parents can pay a reduced fee at time of service.  [facility] Department of [facility]  [address], [place] [phone] Accepts children up to age 33 who are enrolled in [place] or [place]; pregnant women with a Medicaid card; and children who have applied for Medicaid or  Health Choice, but were declined, whose parents can pay a reduced fee at time of service.  [facility]  [place] [phone] Patients are seen by appointment only. Walk-ins are not accepted. [place] will see patients 26 years of age and older. Monday - Tuesday (8am-5pm) Most Wednesdays (8:30-5pm) $30 per  visit, cash only  [facility] Adult [employer] PROGRAM  [address], [facility] [phone] Patients are seen by appointment only. Walk-ins are not accepted. [place] will see patients 66 years of age and older. One Wednesday Evening (Monthly: Volunteer Based).  $30 per visit, cash only  [place]  [phone] for adults; Children under age 59, call Graduate Pediatric Dentistry at [phone]. Children aged 87-14, please call [phone] to request a pediatric application.  Dental services are provided in all areas of dental care including fillings, crowns and bridges, complete and partial dentures, implants, gum treatment, root canals, and extractions. Preventive care is also provided. Treatment is provided to both adults and children. Patients are selected via a lottery and there is often a waiting list.   [facility] [address] [place]  [phone] [email or web address]   [facility] [address] [place], [place] [phone], Ext. [phone] Second and  Fourth Thursday of each month, opens at 6:30 AM; Clinic ends at 9 AM.  Patients are seen on a first-come first-served basis, and a limited number are seen during each clinic.   [facility]  [address] [place] Danker [place], [place] [phone]   Eligibility Requirements You must have lived in [place], [place], or [place] counties for at least the last three months.   You cannot be eligible for state or federal sponsored [employer], including [employer], [place], or [employer].   You generally cannot be eligible for healthcare insurance through your employer.    How to apply: Eligibility screenings are held every Tuesday and Wednesday afternoon from 1:00 pm until 4:00 pm. You do not need an appointment for the interview!  [facility] [address] [place], [place]   Maria Antonia  [place] Department  [place]  [phone]    Behavioral Health Resources in the Community: Intensive Outpatient Programs Organization         Address  Phone  Notes  [place]. [address] [place], [place] [phone]   [facility] Outpatient [address], [place], [place]   ADS: [facility] [address] [place], [place]   [place] [address],  [place], [place] or [phone]   Substance Abuse Resources Organization         Address  Phone  Notes  Al[facility] and Drug Services  [phone]   [place]  [phone]   [facility]   Chinita Pester  [phone]   Residential & Outpatient Substance Abuse Program  [phone]   Psychological Services Organization         Address  Phone  Notes  [facility]  [place]  [phone]   [place]  [address] [place] or [phone]    Mobile Crisis Teams Organization         Address  Phone  Notes  [facility], Mobile Crisis Care Unit  [phone]   [facility]  [address] [place], [place]   [place] [address] [place] [phone]    Self-Help/Support Groups Organization         Address  Phone  Notes  [facility] [place] - variety of support groups  [phone]- [phone] Call for more information  Narcotics Anonymous (NA), Caring [facility] [address], [employer] [place]  2 meetings at this location   Statistician         Address  Phone  Notes  [facility] [address],    [place]  [phone]   [facility]  [address] [place] [address], [place], [place] [phone]   [facility] [address] [place], [place] [phone] Admissions: 8am-3pm M-F  [facility] [address]    [place], [place] [phone]   [facility] [address] [place], [place], [place] [phone]   [facility] [address],  [place], [place] [phone]   [facility] - Intensive Outpatient [address], [place], [place] [phone]   [facility] (Addiction Recovery Care Assoc.) [address] [place].,  [place], [place] [phone] or [phone]   [facility] ([facility]) [address], [place], [place] [phone] Accepts Medicaid  [facility] [place] [address],  [place] [phone] Substance Abuse/Addiction Treatment   [facility] Organization         Address  Phone  Notes  [facility]  [phone]   Angie Fava, PhD [address] [place] Kn[address] [place], [place]   [phone] or [phone]   [facility] Behavioral   [address] [place], [place] [phone]   [facility] [address], [place], [place] [phone] Insurance/Medicaid/sponsorship through [facility] and Families [address]                                    [place], [place] [phone] Therapy/tele-psych/case  [facility] [address], [place] [phone]    Dr. Lolly Mustache  [phone]   [facility]  [employer] [facility] 1) [address] [place] 2) [address], [place] 3)  [address], [place] [phone]  [phone]   [facility] Child Abuse Hotline (845)729-2929 or [phone] (After Hours)       Dental Pain A tooth ache may be caused by cavities (tooth decay). Cavities expose the nerve of the tooth to air and hot or cold temperatures. It may come from an infection or abscess (also called a boil or furuncle) around your tooth. It is also often caused by dental caries (tooth decay). This causes the pain you are having. DIAGNOSIS  Your caregiver can diagnose this problem by exam. TREATMENT   If caused by an infection, it may be treated with medications which kill germs (antibiotics) and pain medications as prescribed by your caregiver. Take medications as directed.  Only take over-the-counter or prescription medicines for pain, discomfort, or fever as directed by your caregiver.  Whether the tooth ache today is caused by infection or dental disease, you should see your dentist as soon as possible for further care. SEEK MEDICAL CARE IF: The exam and treatment you received today has been provided on an emergency basis only. This is not a substitute for complete medical or dental care. If your problem worsens or new problems (symptoms) appear, and you are unable to meet with your dentist, call or return  to this location. SEEK IMMEDIATE MEDICAL CARE IF:   You have a fever.  You develop redness and swelling of your face, jaw, or neck.  You  are unable to open your mouth.  You have severe pain uncontrolled by pain medicine. MAKE SURE YOU:   Understand these instructions.  Will watch your condition.  Will get help right away if you are not doing well or get worse. Document Released: 10/21/2005 Document Revised: 01/13/2012 Document Reviewed: 06/08/2008 [facility] Patient Information 2015 [place], [place]. This information is not intended to replace advice given to you by your health care provider. Make sure you discuss any questions you have with your health care provider.

## 2014-06-26 NOTE — ED Notes (Signed)
Pt states that his teeth started hurting 4 days ago. Pt states that he has a dentist appointment on Tuesday.

## 2014-06-26 NOTE — ED Notes (Signed)
Patient presents with c/o dental pain to the left top

## 2014-06-26 NOTE — ED Provider Notes (Signed)
CSN: [id number]     Arrival date & time 06/26/14  0424 History   First MD Initiated Contact with Patient 06/26/14 [phone]     Chief Complaint  Patient presents with  . Dental Pain     (Consider location/radiation/quality/duration/timing/severity/associated sxs/prior Treatment) HPI Kevin Fitzgerald is a 53 y.o. male who comes in for evaluation of tooth pain. He says the pain began about a month ago he just got progressively worse. He can localize the pain to his upper left premolars and characterizes the pain as an intense ache-rates it as a 10/10. He says he has tried Orajel, [employer], Tylenol without relief. He has an appointment with his dentist on Tuesday for further evaluation and management. He denies any fevers, chills, difficulty breathing, nausea, vomiting.  Past Medical History  Diagnosis Date  . Hypertension   . Asthma     as child  . Arthritis     knee   Past Surgical History  Procedure Laterality Date  . Transurethral resection of prostate N/A 09/22/2013    Procedure: TRANSURETHRAL RESECTION OF THE PROSTATE WITH GYRUS INSTRUMENTS;  Surgeon: Sebastian Ache, MD;  Location: [facility];  Service: Urology;  Laterality: N/A;   History reviewed. No pertinent family history. History  Substance Use Topics  . Smoking status: Current Every Day Smoker -- 0.50 packs/day for 30 years    Types: Cigarettes  . Smokeless tobacco: Not on file  . Alcohol Use: Yes     Comment: occassionally    Review of Systems  Constitutional: Negative for fever.  HENT: Negative for drooling and sinus pressure.        Reports tooth pain  Eyes: Negative for visual disturbance.  Respiratory: Negative for shortness of breath.   Cardiovascular: Negative for chest pain.  All other systems reviewed and are negative.     Allergies  Review of patient's allergies indicates no known allergies.  Home Medications   Prior to Admission medications   Medication Sig Start Date End Date Taking? Authorizing  Provider  diclofenac (VOLTAREN) 75 MG EC tablet Take 1 tablet (75 mg total) by mouth 2 (two) times daily as needed. 11/18/13   Rodolph Bong, MD  finasteride (PROSCAR) 5 MG tablet Take 5 mg by mouth daily.    Historical Provider, MD  oxyCODONE-acetaminophen (PERCOCET) 5-325 MG per tablet Take 2 tablets by mouth every 4 (four) hours as needed. 06/26/14   Earle Gell Nicklous Aburto, PA-C  pantoprazole (PROTONIX) 40 MG tablet Take 1 tablet (40 mg total) by mouth daily. 04/03/14   Linna Hoff, MD  penicillin v potassium (VEETID) 500 MG tablet Take 1 tablet (500 mg total) by mouth 4 (four) times daily. 06/26/14 07/03/14  Earle Gell Georgi Tuel, PA-C  traMADol (ULTRAM) 50 MG tablet Take 1 tablet (50 mg total) by mouth every 6 (six) hours as needed. 11/18/13   Rodolph Bong, MD   BP 145/84  Pulse 57  Temp(Src) 98.3 F (36.8 C)  Resp 18  Ht  (1.727 m)  Wt 160 lb (72.576 kg)  BMI 24.33 kg/m2  SpO2 100% Physical Exam  Nursing note and vitals reviewed. Constitutional: He appears well-developed and well-nourished.  HENT:  Head: Normocephalic and atraumatic.  Mouth/Throat: Oropharynx is clear and moist. No oropharyngeal exudate.  Upper left premolar is cracked, several caries. No evidence of fluctuance or drainable mass. No gingival erythema, mouth sores or lesions. No face pain with palpation.. No lower jaw pain with palpation. No blood in oropharynx. Uvula midline, no  asymmetry. Patent Airway.  Skin: He is not diaphoretic.    ED Course  Procedures (including critical care time) Labs Review Labs Reviewed - No data to display  Imaging Review No results found.   EKG Interpretation None      Meds given in ED:  Medications  HYDROcodone-acetaminophen (NORCO/VICODIN) 5-325 MG per tablet 1 tablet (1 tablet Oral Given 06/26/14 0442)  oxyCODONE-acetaminophen (PERCOCET/ROXICET) 5-325 MG per tablet 2 tablet (2 tablets Oral Given 06/26/14 0718)    Discharge Medication List as of 06/26/2014  7:15 AM    START  taking these medications   Details  oxyCODONE-acetaminophen (PERCOCET) 5-325 MG per tablet Take 2 tablets by mouth every 4 (four) hours as needed., Starting 06/26/2014, Until Discontinued, Print    penicillin v potassium (VEETID) 500 MG tablet Take 1 tablet (500 mg total) by mouth 4 (four) times daily., Starting 06/26/2014, Last dose on Sun 07/03/14, Print       Filed Vitals:   06/26/14 0435 06/26/14 0722  BP: 145/84 152/90  Pulse: 57 62  Temp: 98.3 F (36.8 C) 98 F (36.7 C)  TempSrc:  Oral  Resp: 18 22  Height:  (1.727 m)   Weight: 160 lb (72.576 kg)   SpO2: 100% 100%     MDM  Vitals stable - WNL -afebrile Pt reports mild relief from pain medication in ED PE shows no evidence of a drainable abscess. Tooth has been broken  In past. Will require Dentist No concern for PTA, [place] angina.  Will DC with pain medicine and Penicillin to cover for potential infection. Will see his dentist Tuesday for regularly scheduled appointment. Discussed f/u with PCP and return precautions, pt very amenable to plan.   Final diagnoses:  Dentalgia   Prior to patient discharge, I discussed and reviewed this case with Dr.Manly        Sharlene Motts, PA-C 06/26/14 1951

## 2014-06-26 NOTE — ED Notes (Signed)
PA at bedside.

## 2014-06-26 NOTE — ED Notes (Signed)
Cartner, PA at bedside 

## 2014-07-09 NOTE — ED Provider Notes (Signed)
Medical screening examination/treatment/procedure(s) were performed by non-physician practitioner and as supervising physician I was immediately available for consultation/collaboration.   EKG Interpretation None        Brandt Loosen, MD 07/09/14 [phone]

## 2014-09-09 ENCOUNTER — Emergency Department (INDEPENDENT_AMBULATORY_CARE_PROVIDER_SITE_OTHER)
Admission: EM | Admit: 2014-09-09 | Discharge: 2014-09-09 | Disposition: A | Discharge status: Home / Self Care | Payer: 59 | Source: Home / Self Care | Attending: Emergency Medicine | Admitting: Emergency Medicine

## 2014-09-09 ENCOUNTER — Encounter (HOSPITAL_COMMUNITY): Payer: Self-pay

## 2014-09-09 DIAGNOSIS — H6122 Impacted cerumen, left ear: Secondary | ICD-10-CM | POA: Diagnosis not present

## 2014-09-09 NOTE — ED Notes (Signed)
States he uses ear protection 12-14 hours a day , every day , and thinks he may have wax build up in left ear. Pain in left ear when he lays on that side

## 2014-09-09 NOTE — ED Provider Notes (Signed)
  Chief Complaint   Ear Fullness   History of Present Illness   Kevin Fitzgerald is a Who has had a one-week history of left ear congestion and pain when he lies on that side. He denies any drainage. He wears earplugs in his work. The right ear is normal. He denies any fever, headache, nasal congestion, rhinorrhea, or sore throat.  Review of Systems   Other than as noted above, the patient denies any of the following symptoms: Systemic:  No fevers or chills. Eye:  No redness, pain, discharge, itching, blurred vision, or diplopia. ENT:  No headache, nasal congestion, sneezing, itching, epistaxis, ear pain, decreased hearing, ringing in ears, vertigo, or tinnitus.  No oral lesions, sore throat, or hoarseness. Neck:  No neck pain or adenopathy. Skin:  No rash or itching.  PMFSH   Past medical history, family history, social history, meds, and allergies were reviewed.   Physical Examination     Vital signs:  BP 122/77 mmHg  Pulse 73  Temp(Src) 98.2 F (36.8 C) (Oral)  Resp 14  SpO2 100% General:  Alert and oriented.  In no distress.  Skin warm and dry. Eye:  PERRL, full EOMs, lids and conjunctiva normal.   ENT:  There was a large wax impaction in the left ear canal, such that the ear canal or the TM could not be seen. The right ear canal and TM are normal.  Nasal mucosa not congested and without drainage.  Mucous membranes moist, no oral lesions, normal dentition, pharynx clear.  No cranial or facial pain to palplation. There is no pain or swelling over the mastoid. Neck:  Supple, full ROM.  No adenopathy, tenderness or mass.  Thyroid normal. Lungs:  Breath sounds clear and equal bilaterally.  No wheezes, rales or rhonchi. Heart:  Rhythm regular, without extrasystoles.  No gallops or murmers. Skin:  Clear, warm and dry.   Course in Urgent Care Center   The left ear canal was irrigated and curetted, and a large amount wax was removed. Thereafter the patient can hear normally, and  his air was clear without any inflammation of the TM or canal.  Assessment   The encounter diagnosis was Cerumen impaction, left.  Plan    1.  Meds:  The following meds were prescribed:   New Prescriptions   No medications on file    2.  Patient Education/Counseling:  The patient was given appropriate handouts, self care instructions, and instructed in symptomatic relief.  The patient was counseled not to use Q-tips and may use Debrox eardrops on a monthly basis.  3.  Follow up:  The patient was told to follow up here if no better in 3 to 4 days, or sooner if becoming worse in any way, and given some red flag symptoms such as ear pain or difficulty hearing which would prompt immediate return.       Reuben Likesavid C Loxley Cibrian, MD 09/09/14 2038

## 2014-09-09 NOTE — Discharge Instructions (Signed)
Cerumen Impaction A cerumen impaction is when the wax in your ear forms a plug. This plug usually causes reduced hearing. Sometimes it also causes an earache or dizziness. Removing a cerumen impaction can be difficult and painful. The wax sticks to the ear canal. The canal is sensitive and bleeds easily. If you try to remove a heavy wax buildup with a cotton tipped swab, you may push it in further. Irrigation with water, suction, and small ear curettes may be used to clear out the wax. If the impaction is fixed to the skin in the ear canal, ear drops may be needed for a few days to loosen the wax. People who build up a lot of wax frequently can use ear wax removal products available in your local drugstore. SEEK MEDICAL CARE IF:  You develop an earache, increased hearing loss, or marked dizziness. Document Released: 11/28/2004 Document Revised: 01/13/2012 Document Reviewed: 01/18/2010 [facility] Patient Information 2015 [place], [place]. This information is not intended to replace advice given to you by your health care provider. Make sure you discuss any questions you have with your health care provider.   May use Debrox eardrops to clean out ear once monthly.  Avoid Q-tips.

## 2014-10-20 ENCOUNTER — Encounter (HOSPITAL_COMMUNITY): Payer: Self-pay | Admitting: *Deleted

## 2014-10-20 ENCOUNTER — Emergency Department (INDEPENDENT_AMBULATORY_CARE_PROVIDER_SITE_OTHER)
Admission: EM | Admit: 2014-10-20 | Discharge: 2014-10-20 | Disposition: A | Discharge status: Home / Self Care | Payer: No Typology Code available for payment source | Source: Home / Self Care | Attending: Emergency Medicine | Admitting: Emergency Medicine

## 2014-10-20 ENCOUNTER — Other Ambulatory Visit (HOSPITAL_COMMUNITY)
Admission: RE | Admit: 2014-10-20 | Discharge: 2014-10-20 | Disposition: A | Discharge status: Home / Self Care | Payer: No Typology Code available for payment source | Source: Ambulatory Visit | Attending: Emergency Medicine | Admitting: Emergency Medicine

## 2014-10-20 DIAGNOSIS — Z113 Encounter for screening for infections with a predominantly sexual mode of transmission: Secondary | ICD-10-CM | POA: Insufficient documentation

## 2014-10-20 LAB — POCT URINALYSIS DIP (DEVICE)
Bilirubin Urine: NEGATIVE
Glucose, UA: NEGATIVE mg/dL
Hgb urine dipstick: NEGATIVE
Ketones, ur: NEGATIVE mg/dL
Leukocytes, UA: NEGATIVE
NITRITE: NEGATIVE
Protein, ur: NEGATIVE mg/dL
Specific Gravity, Urine: 1.02 (ref 1.005–1.030)
UROBILINOGEN UA: 1 mg/dL (ref 0.0–1.0)
pH: 7 (ref 5.0–8.0)

## 2014-10-20 NOTE — Discharge Instructions (Signed)
We will call you if any of your testing comes back positive. Please follow up with your Urologist as your symptoms may be related to the surgery you had.

## 2014-10-20 NOTE — ED Notes (Signed)
Pt    Wants  To be  Checked for  An  Std    He  Reports   A  History    Of  Prostate  Problems       In past  And  Reports some  Vague  Leaking   From penis  For  sev  Months  He  denys  Any sores  Or  Discharge       He  Wants  tobe  Checked  For syphillis   And  Aids

## 2014-10-20 NOTE — ED Provider Notes (Signed)
CSN: [id number]     Arrival date & time 10/20/14  1443 History   First MD Initiated Contact with Patient 10/20/14 1506     Chief Complaint  Patient presents with  . Exposure to STD   (Consider location/radiation/quality/duration/timing/severity/associated sxs/prior Treatment) HPI He is a 53 year old man here for penile discharge. He states he had some sort of procedure done to his prostate 2 months ago. Since that time he reports some clear penile discharge. He also reports difficulty with erections. He denies any dysuria or abdominal pain. No rashes or lesions. He denies any new sexual partners, but would like to be tested for STDs. He states he is facet follow-up with his urologist in January, but does not have an appointment time.  Past Medical History  Diagnosis Date  . Hypertension   . Asthma     as child  . Arthritis     knee   Past Surgical History  Procedure Laterality Date  . Transurethral resection of prostate N/A 09/22/2013    Procedure: TRANSURETHRAL RESECTION OF THE PROSTATE WITH GYRUS INSTRUMENTS;  Surgeon: Sebastian Acheheodore Manny, MD;  Location: [facility];  Service: Urology;  Laterality: N/A;   History reviewed. No pertinent family history. History  Substance Use Topics  . Smoking status: Current Every Day Smoker -- 0.50 packs/day for 30 years    Types: Cigarettes  . Smokeless tobacco: Not on file  . Alcohol Use: Yes     Comment: occassionally    Review of Systems  Gastrointestinal: Negative.   Genitourinary: Positive for discharge. Negative for dysuria.    Allergies  Review of patient's allergies indicates no known allergies.  Home Medications   Prior to Admission medications   Medication Sig Start Date End Date Taking? Authorizing Provider  diclofenac (VOLTAREN) 75 MG EC tablet Take 1 tablet (75 mg total) by mouth 2 (two) times daily as needed. 11/18/13   Rodolph BongEvan S Corey, MD  finasteride (PROSCAR) 5 MG tablet Take 5 mg by mouth daily.    Historical Provider, MD   oxyCODONE-acetaminophen (PERCOCET) 5-325 MG per tablet Take 2 tablets by mouth every 4 (four) hours as needed. 06/26/14   Earle GellBenjamin W Cartner, PA-C  pantoprazole (PROTONIX) 40 MG tablet Take 1 tablet (40 mg total) by mouth daily. 04/03/14   Linna HoffJames D Kindl, MD  traMADol (ULTRAM) 50 MG tablet Take 1 tablet (50 mg total) by mouth every 6 (six) hours as needed. 11/18/13   Rodolph BongEvan S Corey, MD   BP 120/72 mmHg  Pulse 72  Temp(Src) 98.2 F (36.8 C) (Oral)  Resp 16  SpO2 99% Physical Exam  Constitutional: He is oriented to person, place, and time. He appears well-developed and well-nourished. No distress.  Cardiovascular: Normal rate.   Pulmonary/Chest: Effort normal.  Neurological: He is alert and oriented to person, place, and time.    ED Course  Procedures (including critical care time) Labs Review Labs Reviewed  HIV ANTIBODY (ROUTINE TESTING)  RPR  POCT URINALYSIS DIP (DEVICE)  URINE CYTOLOGY ANCILLARY ONLY    Imaging Review No results found.   MDM   1. Screen for STD (sexually transmitted disease)    Urine and blood collected for GC, Chlamydia, trichomoniasis, HIV, RPR. Recommended that he follow-up with his urologist as many of his symptoms may be a consequence of the procedure he had.    Charm RingsErin J Hilary Pundt, MD 10/20/14 [phone]

## 2014-10-20 NOTE — ED Notes (Signed)
Patient is unable to void at this time 

## 2014-10-21 LAB — RPR

## 2014-10-21 LAB — URINE CYTOLOGY ANCILLARY ONLY
Chlamydia: NEGATIVE
NEISSERIA GONORRHEA: NEGATIVE
TRICH (WINDOWPATH): NEGATIVE

## 2014-10-21 LAB — HIV ANTIBODY (ROUTINE TESTING W REFLEX): HIV 1&2 Ab, 4th Generation: NONREACTIVE

## 2014-11-03 ENCOUNTER — Telehealth (HOSPITAL_COMMUNITY): Payer: Self-pay | Admitting: *Deleted

## 2014-11-03 NOTE — ED Notes (Signed)
Pt. called requesting copy of his lab results.  Pt. verified x 2 and given neg. Results.  Pt. instructed to bring picture ID and instructed he would have to fill out a medical release form.  Copy of labs taken to front desk and put in brown envelope.  Cindy aware. Vassie MoselleYork, Lucile Didonato M 11/03/2014

## 2015-02-23 ENCOUNTER — Ambulatory Visit (INDEPENDENT_AMBULATORY_CARE_PROVIDER_SITE_OTHER): Payer: No Typology Code available for payment source | Admitting: Physician Assistant

## 2015-02-23 VITALS — BP 112/74 | HR 51 | Temp 97.8°F | Resp 16 | Ht 69.5 in | Wt 155.4 lb

## 2015-02-23 DIAGNOSIS — Z1211 Encounter for screening for malignant neoplasm of colon: Secondary | ICD-10-CM | POA: Diagnosis not present

## 2015-02-23 DIAGNOSIS — R300 Dysuria: Secondary | ICD-10-CM | POA: Diagnosis not present

## 2015-02-23 DIAGNOSIS — Z0000 Encounter for general adult medical examination without abnormal findings: Secondary | ICD-10-CM | POA: Diagnosis not present

## 2015-02-23 DIAGNOSIS — Z23 Encounter for immunization: Secondary | ICD-10-CM | POA: Diagnosis not present

## 2015-02-23 LAB — POCT URINALYSIS DIPSTICK
Bilirubin, UA: NEGATIVE
Glucose, UA: NEGATIVE
Ketones, UA: NEGATIVE
Leukocytes, UA: NEGATIVE
NITRITE UA: NEGATIVE
Protein, UA: NEGATIVE
RBC UA: NEGATIVE
Spec Grav, UA: 1.02
UROBILINOGEN UA: 1
pH, UA: 7

## 2015-02-23 LAB — POCT UA - MICROSCOPIC ONLY
Bacteria, U Microscopic: NEGATIVE
Casts, Ur, LPF, POC: NEGATIVE
Crystals, Ur, HPF, POC: NEGATIVE
Mucus, UA: NEGATIVE
WBC, Ur, HPF, POC: NEGATIVE
Yeast, UA: NEGATIVE

## 2015-02-23 NOTE — Progress Notes (Signed)
Subjective:    Patient ID: Kevin Fitzgerald, male    DOB: 01/23/1961, 54 y.o.   MRN: [id number]  Chief Complaint  Patient presents with  . Employment Physical   Prior to Admission medications   Medication Sig Start Date End Date Taking? Authorizing Provider  finasteride (PROSCAR) 5 MG tablet Take 5 mg by mouth daily.    Historical Provider, MD   Medications, allergies, past medical history, surgical history, family history, social history and problem list reviewed and updated.  HPI  [age] yom with pmh bph s/p TURP procedure 2014 presents needing cpe for work.   Installs Psychologist, prison and probation servicessewer/water lines outdoors. Denies cp, sob, palps while working. Had a syncopal episode couple months ago while working where he got weak and dizzy and fell to the ground. No assoc palps, cp, sob with episode. Had not eaten or had water that day. No further episodes since then.   Has insurance but not able to afford copays so does not seek medical care regularly.   No dentist. No eye doctor though states his vision has been slowly getting worse past few years. Does not exercise though active with work. No specific diet. Eats lot of fast food. Juice daily.   Due for colonoscopy. Agreeable to get this done.  Due for tdap. Agreeable for this. He is a smoker so could receive pneumovax but declines.   Mentions that he feels he is often dehydrated as doesn't drink lot of water.   Smokes 1 ppd for past 15 years. Drinks alcohol maybe 1-2 times per week. Several beers at at time when drinking. Denies illicit drug use.   Declines labwork today.   Review of Systems Negative per ROS section on cpe sheet except as listed below.   Seasonal allergies/sneezing - takes allegra/benadryl which helps Visual problems - ongoing for several yrs, doesn't see eye dr because cannot afford the copay Pain with urination/urine decreased - Had TURP 2014. Sx were ongoing prior to that and persistent. Has not followed with urology since  procedure as does not wish to pay the copay. Declines referral today.  Back pain - Mid and low back. Stable for many yrs. Denies bowel/bladder incontinence or perianal los Dizziness - Frequently gets lightheaded with sitting to standing Sleep disturbance - stable for several years, trouble sleeping, does not take anything or wish to take anything for this     Objective:   Physical Exam  Constitutional: He is oriented to person, place, and time. He appears well-developed and well-nourished.  Non-toxic appearance. He does not have a sickly appearance. He does not appear ill. No distress.  BP 112/74 mmHg  Pulse 51  Temp(Src) 97.8 F (36.6 C) (Oral)  Resp 16  Ht 5' 9.5" (1.765 m)  Wt 155 lb 6.4 oz (70.489 kg)  BMI 22.63 kg/m2  SpO2 99%   HENT:  Right Ear: Tympanic membrane normal.  Left Ear: Tympanic membrane normal.  Mouth/Throat: Uvula is midline and oropharynx is clear and moist.  Eyes: Conjunctivae and EOM are normal. Pupils are equal, round, and reactive to light.  Slight yellowing of eyes bilaterally.   Neck: Trachea normal and normal range of motion. Carotid bruit is not present. No thyroid mass and no thyromegaly present.  Cardiovascular: Normal rate, regular rhythm and normal heart sounds.  Exam reveals no gallop.   No murmur heard. Pulmonary/Chest: Effort normal and breath sounds normal. No tachypnea.  Abdominal: Soft. Normal appearance and bowel sounds are normal. There is no tenderness. There is  no CVA tenderness. No hernia.  Musculoskeletal: Normal range of motion.  Neurological: He is alert and oriented to person, place, and time. He has normal strength. No cranial nerve deficit or sensory deficit. He displays a negative Romberg sign.  Normal heel to shin. Normal rapid alternating movements.   Psychiatric: He has a normal mood and affect. His speech is normal and behavior is normal.   Results for orders placed or performed in visit on 02/23/15  POCT urinalysis dipstick    Result Value Ref Range   Color, UA yellow    Clarity, UA clear    Glucose, UA neg    Bilirubin, UA neg    Ketones, UA neg    Spec Grav, UA 1.020    Blood, UA neg    pH, UA 7.0    Protein, UA neg    Urobilinogen, UA 1.0    Nitrite, UA neg    Leukocytes, UA Negative   POCT UA - Microscopic Only  Result Value Ref Range   WBC, Ur, HPF, POC neg    RBC, urine, microscopic 0-1    Bacteria, U Microscopic neg    Mucus, UA neg    Epithelial cells, urine per micros 0-2    Crystals, Ur, HPF, POC neg    Casts, Ur, LPF, POC neg    Yeast, UA neg       Assessment & Plan:   67 yom with pmh bph s/p TURP procedure 2014 presents needing cpe for work.   Annual physical exam --normal exam, vitals today --pt encouraged to see dentist and eye dr for vision changes --pt encouraged to drink more water while working --encouraged to stop smoking, not interested at this time --rtc if interested in pneumovax --form signed for work, no issues with performing tasks as long as eating breakfast and staying hydrated  Dysuria - Plan: POCT urinalysis dipstick, POCT UA - Microscopic Only --normal ua --pt declines referral to return to see urology  Need for Tdap vaccination - Plan: Tdap vaccine greater than or equal to 7yo IM  Screen for colon cancer - Plan: Ambulatory referral to Gastroenterology  Donnajean Lopes, PA-C Physician Assistant-Certified [facility]  02/23/2015 6:42 PM

## 2015-02-23 NOTE — Patient Instructions (Addendum)
Your exam was normal today. You received the tdap vaccine today. We referred you for a colonoscopy today. Your urine sample looked good today and didn't show any signs of UTI.   Health Maintenance A healthy lifestyle and preventative care can promote health and wellness.  Maintain regular health, dental, and eye exams.  Eat a healthy diet. Foods like vegetables, fruits, whole grains, low-fat dairy products, and lean protein foods contain the nutrients you need and are low in calories. Decrease your intake of foods high in solid fats, added sugars, and salt. Get information about a proper diet from your health care provider, if necessary.  Regular physical exercise is one of the most important things you can do for your health. Most adults should get at least 150 minutes of moderate-intensity exercise (any activity that increases your heart rate and causes you to sweat) each week. In addition, most adults need muscle-strengthening exercises on 2 or more days a week.   Maintain a healthy weight. The body mass index (BMI) is a screening tool to identify possible weight problems. It provides an estimate of body fat based on height and weight. Your health care provider can find your BMI and can help you achieve or maintain a healthy weight. For males 20 years and older:  A BMI below 18.5 is considered underweight.  A BMI of 18.5 to 24.9 is normal.  A BMI of 25 to 29.9 is considered overweight.  A BMI of 30 and above is considered obese.  Maintain normal blood lipids and cholesterol by exercising and minimizing your intake of saturated fat. Eat a balanced diet with plenty of fruits and vegetables. Blood tests for lipids and cholesterol should begin at age 52 and be repeated every 5 years. If your lipid or cholesterol levels are high, you are over age 52, or you are at high risk for heart disease, you may need your cholesterol levels checked more frequently.Ongoing high lipid and cholesterol  levels should be treated with medicines if diet and exercise are not working.  If you smoke, find out from your health care provider how to quit. If you do not use tobacco, do not start.  Lung cancer screening is recommended for adults aged 55-80 years who are at high risk for developing lung cancer because of a history of smoking. A yearly low-dose CT scan of the lungs is recommended for people who have at least a 30-pack-year history of smoking and are current smokers or have quit within the past 15 years. A pack year of smoking is smoking an average of 1 pack of cigarettes a day for 1 year (for example, a 30-pack-year history of smoking could mean smoking 1 pack a day for 30 years or 2 packs a day for 15 years). Yearly screening should continue until the smoker has stopped smoking for at least 15 years. Yearly screening should be stopped for people who develop a health problem that would prevent them from having lung cancer treatment.  If you choose to drink alcohol, do not have more than 2 drinks per day. One drink is considered to be 12 oz (360 mL) of beer, 5 oz (150 mL) of wine, or 1.5 oz (45 mL) of liquor.  Avoid the use of street drugs. Do not share needles with anyone. Ask for help if you need support or instructions about stopping the use of drugs.  High blood pressure causes heart disease and increases the risk of stroke. Blood pressure should be checked at least  every 1-2 years. Ongoing high blood pressure should be treated with medicines if weight loss and exercise are not effective.  If you are 4-67 years old, ask your health care provider if you should take aspirin to prevent heart disease.  Diabetes screening involves taking a blood sample to check your fasting blood sugar level. This should be done once every 3 years after age 57 if you are at a normal weight and without risk factors for diabetes. Testing should be considered at a younger age or be carried out more frequently if you  are overweight and have at least 1 risk factor for diabetes.  Colorectal cancer can be detected and often prevented. Most routine colorectal cancer screening begins at the age of 67 and continues through age 11. However, your health care provider may recommend screening at an earlier age if you have risk factors for colon cancer. On a yearly basis, your health care provider may provide home test kits to check for hidden blood in the stool. A small camera at the end of a tube may be used to directly examine the colon (sigmoidoscopy or colonoscopy) to detect the earliest forms of colorectal cancer. Talk to your health care provider about this at age 85 when routine screening begins. A direct exam of the colon should be repeated every 5-10 years through age 73, unless early forms of precancerous polyps or small growths are found.  People who are at an increased risk for hepatitis B should be screened for this virus. You are considered at high risk for hepatitis B if:  You were born in a country where hepatitis B occurs often. Talk with your health care provider about which countries are considered high risk.  Your parents were born in a high-risk country and you have not received a shot to protect against hepatitis B (hepatitis B vaccine).  You have HIV or AIDS.  You use needles to inject street drugs.  You live with, or have sex with, someone who has hepatitis B.  You are a man who has sex with other men (MSM).  You get hemodialysis treatment.  You take certain medicines for conditions like cancer, organ transplantation, and autoimmune conditions.  Hepatitis C blood testing is recommended for all people born from [birth year] through 1963 and any individual with known risk factors for hepatitis C.  Healthy men should no longer receive prostate-specific antigen (PSA) blood tests as part of routine cancer screening. Talk to your health care provider about prostate cancer screening.  Testicular cancer  screening is not recommended for adolescents or adult males who have no symptoms. Screening includes self-exam, a health care provider exam, and other screening tests. Consult with your health care provider about any symptoms you have or any concerns you have about testicular cancer.  Practice safe sex. Use condoms and avoid high-risk sexual practices to reduce the spread of sexually transmitted infections (STIs).  You should be screened for STIs, including gonorrhea and chlamydia if:  You are sexually active and are younger than 24 years.  You are older than 24 years, and your health care provider tells you that you are at risk for this type of infection.  Your sexual activity has changed since you were last screened, and you are at an increased risk for chlamydia or gonorrhea. Ask your health care provider if you are at risk.  If you are at risk of being infected with HIV, it is recommended that you take a prescription medicine daily to prevent  HIV infection. This is called pre-exposure prophylaxis (PrEP). You are considered at risk if:  You are a man who has sex with other men (MSM).  You are a heterosexual man who is sexually active with multiple partners.  You take drugs by injection.  You are sexually active with a partner who has HIV.  Talk with your health care provider about whether you are at high risk of being infected with HIV. If you choose to begin PrEP, you should first be tested for HIV. You should then be tested every 3 months for as long as you are taking PrEP.  Use sunscreen. Apply sunscreen liberally and repeatedly throughout the day. You should seek shade when your shadow is shorter than you. Protect yourself by wearing long sleeves, pants, a wide-brimmed hat, and sunglasses year round whenever you are outdoors.  Tell your health care provider of new moles or changes in moles, especially if there is a change in shape or color. Also, tell your health care provider if a  mole is larger than the size of a pencil eraser.  A one-time screening for abdominal aortic aneurysm (AAA) and surgical repair of large AAAs by ultrasound is recommended for men aged 65-75 years who are current or former smokers.  Stay current with your vaccines (immunizations). Document Released: 04/18/2008 Document Revised: 10/26/2013 Document Reviewed: 03/18/2011 [facility] Patient Information 2015 [place], [place]. This information is not intended to replace advice given to you by your health care provider. Make sure you discuss any questions you have with your health care provider.   Td Vaccine (Tetanus and Diphtheria): What You Need to Know 1. Why get vaccinated? Tetanus  and diphtheria are very serious diseases. They are rare in the [place] today, but people who do become infected often have severe complications. Td vaccine is used to protect adolescents and adults from both of these diseases. Both tetanus and diphtheria are infections caused by bacteria. Diphtheria spreads from person to person through coughing or sneezing. Tetanus-causing bacteria enter the body through cuts, scratches, or wounds. TETANUS (Lockjaw) causes painful muscle tightening and stiffness, usually all over the body.  It can lead to tightening of muscles in the head and neck so you can't open your mouth, swallow, or sometimes even breathe. Tetanus kills about 1 out of every 5 people who are infected. DIPHTHERIA can cause a thick coating to form in the back of the throat.  It can lead to breathing problems, paralysis, heart failure, and death. Before vaccines, the [place] saw as many as 200,000 cases a year of diphtheria and hundreds of cases of tetanus. Since vaccination began, cases of both diseases have dropped by about 99%. 2. Td vaccine Td vaccine can protect adolescents and adults from tetanus and diphtheria. Td is usually given as a booster dose every 10 years but it can also be given earlier after a  severe and dirty wound or burn. Your doctor can give you more information. Td may safely be given at the same time as other vaccines. 3. Some people should not get this vaccine  If you ever had a life-threatening allergic reaction after a dose of any tetanus or diphtheria containing vaccine, OR if you have a severe allergy to any part of this vaccine, you should not get Td. Tell your doctor if you have any severe allergies.  Talk to your doctor if you:  have epilepsy or another nervous system problem,  had severe pain or swelling after any vaccine containing diphtheria  or tetanus,  ever had Guillain Barr Syndrome (GBS),  aren't feeling well on the day the shot is scheduled. 4. Risks of a vaccine reaction With a vaccine, like any medicine, there is a chance of side effects. These are usually mild and go away on their own. Serious side effects are also possible, but are very rare. Most people who get Td vaccine do not have any problems with it. Mild Problems  following Td (Did not interfere with activities)  Pain where the shot was given (about 8 people in 10)  Redness or swelling where the shot was given (about 1 person in 3)  Mild fever (about 1 person in 15)  Headache or Tiredness (uncommon) Moderate Problems following Td (Interfered with activities, but did not require medical attention)  Fever over 102F (rare) Severe Problems  following Td (Unable to perform usual activities; required medical attention)  Swelling, severe pain, bleeding and/or redness in the arm where the shot was given (rare). Problems that could happen after any vaccine:  Brief fainting spells can happen after any medical procedure, including vaccination. Sitting or lying down for about 15 minutes can help prevent fainting, and injuries caused by a fall. Tell your doctor if you feel dizzy, or have vision changes or ringing in the ears.  Severe shoulder pain and reduced range of motion in the arm where  a shot was given can happen, very rarely, after a vaccination.  Severe allergic reactions from a vaccine are very rare, estimated at less than 1 in a million doses. If one were to occur, it would usually be within a few minutes to a few hours after the vaccination. 5. What if there is a serious reaction? What should I look for?  Look for anything that concerns you, such as signs of a severe allergic reaction, very high fever, or behavior changes. Signs of a severe allergic reaction can include hives, swelling of the face and throat, difficulty breathing, a fast heartbeat, dizziness, and weakness. These would usually start a few minutes to a few hours after the vaccination. What should I do?  If you think it is a severe allergic reaction or other emergency that can't wait, call 9-1-1 or get the person to the nearest hospital. Otherwise, call your doctor.  Afterward, the reaction should be reported to the Vaccine Adverse Event Reporting System (VAERS). Your doctor might file this report, or you can do it yourself through the VAERS web site at www.vaers.LAgents.no, or by calling 1-769-029-7297. VAERS is only for reporting reactions. They do not give medical advice. 6. The National Vaccine Injury Compensation Program The [employer] Vaccine Injury Compensation Program (VICP) is a federal program that was created to compensate people who may have been injured by certain vaccines. Persons who believe they may have been injured by a vaccine can learn about the program and about filing a claim by calling 1-8075602035 or visiting the VICP website at SpiritualWord.at. 7. How can I learn more?  Ask your doctor.  Contact your local or state health department.  Contact the Centers for Disease Control and Prevention (CDC):  Call 503 113 2843 (1-800-CDC-INFO)  Visit CDC's website at PicCapture.uy CDC Td Vaccine Interim VIS (12/08/12) Document Released: 08/18/2006 Document Revised:  03/07/2014 Document Reviewed: 02/02/2014 [facility] Patient Information 2015 [place], [place]. This information is not intended to replace advice given to you by your health care provider. Make sure you discuss any questions you have with your health care provider.

## 2015-09-11 ENCOUNTER — Emergency Department (HOSPITAL_COMMUNITY)
Admission: EM | Admit: 2015-09-11 | Discharge: 2015-09-12 | Disposition: A | Discharge status: Other Acute Inpatient Hospital | Payer: Self-pay | Attending: Emergency Medicine | Admitting: Emergency Medicine

## 2015-09-11 ENCOUNTER — Emergency Department (HOSPITAL_COMMUNITY): Payer: No Typology Code available for payment source

## 2015-09-11 ENCOUNTER — Encounter (HOSPITAL_COMMUNITY): Payer: Self-pay | Admitting: Emergency Medicine

## 2015-09-11 DIAGNOSIS — Z8739 Personal history of other diseases of the musculoskeletal system and connective tissue: Secondary | ICD-10-CM | POA: Insufficient documentation

## 2015-09-11 DIAGNOSIS — J45909 Unspecified asthma, uncomplicated: Secondary | ICD-10-CM | POA: Insufficient documentation

## 2015-09-11 DIAGNOSIS — Y9289 Other specified places as the place of occurrence of the external cause: Secondary | ICD-10-CM | POA: Insufficient documentation

## 2015-09-11 DIAGNOSIS — Z79899 Other long term (current) drug therapy: Secondary | ICD-10-CM | POA: Insufficient documentation

## 2015-09-11 DIAGNOSIS — S62101A Fracture of unspecified carpal bone, right wrist, initial encounter for closed fracture: Secondary | ICD-10-CM | POA: Insufficient documentation

## 2015-09-11 DIAGNOSIS — I10 Essential (primary) hypertension: Secondary | ICD-10-CM | POA: Insufficient documentation

## 2015-09-11 DIAGNOSIS — Z720 Tobacco use: Secondary | ICD-10-CM | POA: Insufficient documentation

## 2015-09-11 DIAGNOSIS — Y9389 Activity, other specified: Secondary | ICD-10-CM | POA: Insufficient documentation

## 2015-09-11 DIAGNOSIS — W2209XA Striking against other stationary object, initial encounter: Secondary | ICD-10-CM | POA: Insufficient documentation

## 2015-09-11 DIAGNOSIS — T450X2A Poisoning by antiallergic and antiemetic drugs, intentional self-harm, initial encounter: Secondary | ICD-10-CM | POA: Insufficient documentation

## 2015-09-11 DIAGNOSIS — Y998 Other external cause status: Secondary | ICD-10-CM | POA: Insufficient documentation

## 2015-09-11 DIAGNOSIS — T50902A Poisoning by unspecified drugs, medicaments and biological substances, intentional self-harm, initial encounter: Secondary | ICD-10-CM

## 2015-09-11 LAB — CBC
HEMATOCRIT: 40.5 % (ref 39.0–52.0)
HEMOGLOBIN: 13.5 g/dL (ref 13.0–17.0)
MCH: 29.1 pg (ref 26.0–34.0)
MCHC: 33.3 g/dL (ref 30.0–36.0)
MCV: 87.3 fL (ref 78.0–100.0)
PLATELETS: 170 10*3/uL (ref 150–400)
RBC: 4.64 MIL/uL (ref 4.22–5.81)
RDW: 12.5 % (ref 11.5–15.5)
WBC: 11.4 10*3/uL — AB (ref 4.0–10.5)

## 2015-09-11 LAB — RAPID URINE DRUG SCREEN, HOSP PERFORMED
Amphetamines: NOT DETECTED
Barbiturates: NOT DETECTED
Benzodiazepines: NOT DETECTED
Cocaine: NOT DETECTED
OPIATES: NOT DETECTED
Tetrahydrocannabinol: NOT DETECTED

## 2015-09-11 LAB — CBG MONITORING, ED: GLUCOSE-CAPILLARY: 99 mg/dL (ref 65–99)

## 2015-09-11 LAB — COMPREHENSIVE METABOLIC PANEL
ALBUMIN: 4 g/dL (ref 3.5–5.0)
ALT: 18 U/L (ref 17–63)
AST: 24 U/L (ref 15–41)
Alkaline Phosphatase: 75 U/L (ref 38–126)
Anion gap: 7 (ref 5–15)
BUN: 16 mg/dL (ref 6–20)
CO2: 28 mmol/L (ref 22–32)
CREATININE: 0.85 mg/dL (ref 0.61–1.24)
Calcium: 9.6 mg/dL (ref 8.9–10.3)
Chloride: 101 mmol/L (ref 101–111)
GFR calc Af Amer: >60 mL/min (ref >60)
GFR calc non Af Amer: >60 mL/min (ref >60)
GLUCOSE: 110 mg/dL — AB (ref 65–99)
POTASSIUM: 3.5 mmol/L (ref 3.5–5.1)
Sodium: 136 mmol/L (ref 135–145)
Total Bilirubin: 0.6 mg/dL (ref 0.3–1.2)
Total Protein: 7.5 g/dL (ref 6.5–8.1)

## 2015-09-11 LAB — ACETAMINOPHEN LEVEL: Acetaminophen (Tylenol), Serum: 12 ug/mL (ref 10–30)

## 2015-09-11 LAB — ETHANOL: Alcohol, Ethyl (B): <5 mg/dL (ref <5)

## 2015-09-11 LAB — SALICYLATE LEVEL: Salicylate Lvl: <4 mg/dL (ref 2.8–30.0)

## 2015-09-11 NOTE — ED Notes (Addendum)
Pt states that he got in an argument with his wife, punched the floor with his R hand (deformity) and took 'two handfuls' of vistaril. Two bottles are present that are approx. Half full. Alert and oriented. 18g LAC.

## 2015-09-11 NOTE — ED Notes (Signed)
PT keeps asking when he is going to get to go home; currently denies SI; pt states that he was doing it for attention; [employer] officer that was on scene advises that pt states that he suicidal when he was on the scene; pt is upset; states "I didn't think it would be this long"; Triage RN advised that pt was SI; I am concerned that pt is stating that he is not SI so that he can leave

## 2015-09-11 NOTE — ED Provider Notes (Signed)
CSN: [id number]     Arrival date & time 09/11/15  2204 History   First MD Initiated Contact with Patient 09/11/15 2245     Chief Complaint  Patient presents with  . Suicidal  . Drug Overdose     (Consider location/radiation/quality/duration/timing/severity/associated sxs/prior Treatment) Patient is a 54 y.o. male presenting with Overdose. The history is provided by the patient.  Drug Overdose  He states that he took a handful of pills at about 8:30 PM. This was with suicidal intent at that time. The pills were apparently hydroxyzine. This happened after he got into an argument with his wife. There was suicidal intent at the time but he currently denies suicidal intent. He also got angry and struck the floor with his right hand and is complaining of pain in the right hand.  Past Medical History  Diagnosis Date  . Hypertension   . Asthma     as child  . Arthritis     knee   Past Surgical History  Procedure Laterality Date  . Transurethral resection of prostate N/A 09/22/2013    Procedure: TRANSURETHRAL RESECTION OF THE PROSTATE WITH GYRUS INSTRUMENTS;  Surgeon: Sebastian Acheheodore Manny, MD;  Location: [facility];  Service: Urology;  Laterality: N/A;   Family History  Problem Relation Age of Onset  . Cancer Mother   . Heart attack Father   . Diabetes Brother    Social History  Substance Use Topics  . Smoking status: Current Every Day Smoker -- 0.50 packs/day for 30 years    Types: Cigarettes  . Smokeless tobacco: None  . Alcohol Use: Yes     Comment: occassionally, 2-3 times per month    Review of Systems  All other systems reviewed and are negative.     Allergies  Review of patient's allergies indicates no known allergies.  Home Medications   Prior to Admission medications   Medication Sig Start Date End Date Taking? Authorizing Provider  finasteride (PROSCAR) 5 MG tablet Take 5 mg by mouth daily.    Historical Provider, MD   BP 155/93 mmHg  Pulse 82  Temp(Src) 98.2 F  (36.8 C) (Oral)  Resp 20  Ht 5' 9.5" (1.765 m)  Wt 150 lb (68.04 kg)  BMI 21.84 kg/m2  SpO2 100% Physical Exam  Nursing note and vitals reviewed.  54 year old male, resting comfortably and in no acute distress. Vital signs are significant for hypertension. Oxygen saturation is 100%, which is normal. Head is normocephalic and atraumatic. PERRLA, EOMI. Oropharynx is clear. Neck is nontender and supple without adenopathy or JVD. Back is nontender and there is no CVA tenderness. Lungs are clear without rales, wheezes, or rhonchi. Chest is nontender. Heart has regular rate and rhythm without murmur. Abdomen is soft, flat, nontender without masses or hepatosplenomegaly and peristalsis is normoactive. Extremities have no cyanosis or edema, full range of motion is present. There is mild swelling over the dorsum of the right hand at the base of the third and fourth metacarpals. Distal neurovascular exam is intact. Skin is warm and dry without rash. Neurologic: Mental status is normal, cranial nerves are intact, there are no motor or sensory deficits.  ED Course  .Splint Application Date/Time: 09/12/2015 12:18 AM Performed by: Dione BoozeGLICK, Caidyn Blossom Authorized by: Preston FleetingGLICK, Deshannon Hinchliffe Consent: Verbal consent obtained. Written consent not obtained. Risks and benefits: risks, benefits and alternatives were discussed Consent given by: patient Patient understanding: patient states understanding of the procedure being performed Patient consent: the patient's understanding of the procedure matches  consent given Procedure consent: procedure consent matches procedure scheduled Relevant documents: relevant documents present and verified Test results: test results available and properly labeled Site marked: the operative site was marked Imaging studies: imaging studies available Required items: required blood products, implants, devices, and special equipment available Patient identity confirmed: verbally with  patient and arm band Location details: right wrist Splint type: volar short arm Supplies used: cotton padding,  Ortho-Glass and elastic bandage Post-procedure: The splinted body part was neurovascularly unchanged following the procedure. Patient tolerance: Patient tolerated the procedure well with no immediate complications Comments: Splint applied by orthopedic technician, neurovascular status checked by me following splint application.   (including critical care time) Labs Review Results for orders placed or performed during the hospital encounter of 09/11/15  Comprehensive metabolic panel  Result Value Ref Range   Sodium 136 135 - 145 mmol/L   Potassium 3.5 3.5 - 5.1 mmol/L   Chloride 101 101 - 111 mmol/L   CO2 28 22 - 32 mmol/L   Glucose, Bld 110 (H) 65 - 99 mg/dL   BUN 16 6 - 20 mg/dL   Creatinine, Ser 6.29 0.61 - 1.24 mg/dL   Calcium 9.6 8.9 - 52.8 mg/dL   Total Protein 7.5 6.5 - 8.1 g/dL   Albumin 4.0 3.5 - 5.0 g/dL   AST 24 15 - 41 U/L   ALT 18 17 - 63 U/L   Alkaline Phosphatase 75 38 - 126 U/L   Total Bilirubin 0.6 0.3 - 1.2 mg/dL   GFR calc non Af Amer >60 >60 mL/min   GFR calc Af Amer >60 >60 mL/min   Anion gap 7 5 - 15  Ethanol (ETOH)  Result Value Ref Range   Alcohol, Ethyl (B) <5 <5 mg/dL  Salicylate level  Result Value Ref Range   Salicylate Lvl <4.0 2.8 - 30.0 mg/dL  Acetaminophen level  Result Value Ref Range   Acetaminophen (Tylenol), Serum 12 10 - 30 ug/mL  CBC  Result Value Ref Range   WBC 11.4 (H) 4.0 - 10.5 K/uL   RBC 4.64 4.22 - 5.81 MIL/uL   Hemoglobin 13.5 13.0 - 17.0 g/dL   HCT 41.3 24.4 - 01.0 %   MCV 87.3 78.0 - 100.0 fL   MCH 29.1 26.0 - 34.0 pg   MCHC 33.3 30.0 - 36.0 g/dL   RDW 27.2 53.6 - 64.4 %   Platelets 170 150 - 400 K/uL  Urine rapid drug screen (hosp performed) (Not at [facility])  Result Value Ref Range   Opiates NONE DETECTED NONE DETECTED   Cocaine NONE DETECTED NONE DETECTED   Benzodiazepines NONE DETECTED NONE DETECTED    Amphetamines NONE DETECTED NONE DETECTED   Tetrahydrocannabinol NONE DETECTED NONE DETECTED   Barbiturates NONE DETECTED NONE DETECTED  CBG monitoring, ED  Result Value Ref Range   Glucose-Capillary 99 65 - 99 mg/dL   Imaging Review Dg Hand Complete Right  09/11/2015  CLINICAL DATA:  Punched floor this morning. Second through fifth metacarpal pain. EXAM: RIGHT HAND - COMPLETE 3+ VIEW COMPARISON:  None. FINDINGS: Comminuted apparent distal carpal bone fracture likely representing the hamate with posteriorly displaced bony fragments into the dorsum of the wrist. No definite dislocation. No destructive bony lesions. Dorsal hand soft tissue swelling without subcutaneous gas or radiopaque foreign bodies. IMPRESSION: Distal carpal row acute displaced fracture, likely representing the hamate with posteriorly displaced bony fragments, no dislocation. Electronically Signed   By: Awilda Metro M.D.   On: 09/11/2015 23:15   I  have personally reviewed and evaluated these images and lab results as part of my medical decision-making.   EKG Interpretation   Date/Time:  Monday September 11 2015 22:23:44 EST Ventricular Rate:  87 PR Interval:  194 QRS Duration: 96 QT Interval:  367 QTC Calculation: 441 R Axis:   52 Text Interpretation:  Sinus rhythm Biatrial enlargement J point elevation  in inferior leads When compared with ECG of 09/17/2013, No significant  change was found Confirmed by [facility]  MD, Alphonzo Devera ([id number]) on 09/11/2015  10:34:07 PM      MDM   Final diagnoses:  Drug overdose, intentional self-harm, initial encounter (HCC)  Closed fracture carpal bone, right, initial encounter    Intentional drug overdose with suicidal intent. This does seem to be an impulsive act, and he does not appear to be suicidal currently. Right hand injury which will be evaluated by x-ray. Poison control has been consulted and recommended observation for 6 hours. Following this period of observation, he will need  to be evaluated by TTS.  X-ray shows proper bone fracture. These placed in a splint. Laboratory workup is unremarkable. He will need to be observed until 2:30 AM. Case is signed out to Dr. Blinda Leatherwood.  Dione Booze, MD 09/12/15 [phone]

## 2015-09-12 ENCOUNTER — Inpatient Hospital Stay (HOSPITAL_COMMUNITY)
Admission: AD | Admit: 2015-09-12 | Discharge: 2015-09-14 | DRG: 881 | Disposition: A | Discharge status: Home / Self Care | Payer: Managed Care, Other (non HMO) | Source: Intra-hospital | Attending: Psychiatry | Admitting: Psychiatry

## 2015-09-12 ENCOUNTER — Encounter (HOSPITAL_COMMUNITY): Payer: Self-pay | Admitting: Behavioral Health

## 2015-09-12 DIAGNOSIS — F329 Major depressive disorder, single episode, unspecified: Secondary | ICD-10-CM | POA: Diagnosis present

## 2015-09-12 DIAGNOSIS — F4323 Adjustment disorder with mixed anxiety and depressed mood: Secondary | ICD-10-CM | POA: Insufficient documentation

## 2015-09-12 DIAGNOSIS — G4700 Insomnia, unspecified: Secondary | ICD-10-CM | POA: Diagnosis present

## 2015-09-12 DIAGNOSIS — F17210 Nicotine dependence, cigarettes, uncomplicated: Secondary | ICD-10-CM | POA: Diagnosis present

## 2015-09-12 DIAGNOSIS — I10 Essential (primary) hypertension: Secondary | ICD-10-CM | POA: Diagnosis present

## 2015-09-12 DIAGNOSIS — Z833 Family history of diabetes mellitus: Secondary | ICD-10-CM

## 2015-09-12 DIAGNOSIS — F4321 Adjustment disorder with depressed mood: Principal | ICD-10-CM | POA: Diagnosis present

## 2015-09-12 DIAGNOSIS — F419 Anxiety disorder, unspecified: Secondary | ICD-10-CM | POA: Diagnosis present

## 2015-09-12 DIAGNOSIS — M1990 Unspecified osteoarthritis, unspecified site: Secondary | ICD-10-CM | POA: Diagnosis present

## 2015-09-12 MED ORDER — ALUM & MAG HYDROXIDE-SIMETH 200-200-20 MG/5ML PO SUSP: 30.0000 mL | ORAL | Status: DC | PRN

## 2015-09-12 MED ORDER — TRAZODONE HCL 50 MG PO TABS
50.0000 mg | ORAL_TABLET | Freq: Every evening | ORAL | Status: DC | PRN
  Administered 2015-09-12 – 2015-09-13 (×2): 50 mg via ORAL
  Filled 2015-09-12 (×2): qty 1

## 2015-09-12 MED ORDER — ONDANSETRON HCL 4 MG PO TABS: 4.0000 mg | ORAL_TABLET | Freq: Three times a day (TID) | ORAL | Status: DC | PRN

## 2015-09-12 MED ORDER — ACETAMINOPHEN 325 MG PO TABS
650.0000 mg | ORAL_TABLET | Freq: Four times a day (QID) | ORAL | Status: DC | PRN
  Administered 2015-09-13 (×2): 650 mg via ORAL
  Filled 2015-09-12 (×2): qty 2

## 2015-09-12 MED ORDER — BOOST / RESOURCE BREEZE PO LIQD
1.0000 | Freq: Two times a day (BID) | ORAL | Status: DC
  Administered 2015-09-12 – 2015-09-14 (×4): 1 via ORAL
  Filled 2015-09-12 (×8): qty 1

## 2015-09-12 MED ORDER — ACETAMINOPHEN 325 MG PO TABS: 650.0000 mg | ORAL_TABLET | ORAL | Status: DC | PRN

## 2015-09-12 MED ORDER — ETODOLAC 300 MG PO CAPS
300.0000 mg | ORAL_CAPSULE | Freq: Two times a day (BID) | ORAL | Status: DC
  Administered 2015-09-12 – 2015-09-14 (×5): 300 mg via ORAL
  Filled 2015-09-12 (×10): qty 1

## 2015-09-12 MED ORDER — IBUPROFEN 200 MG PO TABS
600.0000 mg | ORAL_TABLET | Freq: Three times a day (TID) | ORAL | Status: DC | PRN
  Administered 2015-09-12: 600 mg via ORAL
  Filled 2015-09-12: qty 3

## 2015-09-12 MED ORDER — LORAZEPAM 1 MG PO TABS
1.0000 mg | ORAL_TABLET | Freq: Three times a day (TID) | ORAL | Status: DC | PRN
  Administered 2015-09-12: 1 mg via ORAL
  Filled 2015-09-12: qty 1

## 2015-09-12 MED ORDER — NICOTINE 14 MG/24HR TD PT24: 14.0000 mg | MEDICATED_PATCH | Freq: Once | TRANSDERMAL | Status: DC

## 2015-09-12 MED ORDER — MAGNESIUM HYDROXIDE 400 MG/5ML PO SUSP: 30.0000 mL | Freq: Every day | ORAL | Status: DC | PRN

## 2015-09-12 NOTE — ED Notes (Signed)
Pt changed in to wine scrubs and wanded by security

## 2015-09-12 NOTE — H&P (Addendum)
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 MDD (major depressive disorder) ([place]) [F32.9] 09/12/2015  . BPH (benign prostatic hyperplasia) [N40.0] 06/22/2013  . Dysuria [R30.0] 06/21/2013  . UTI (lower urinary tract infection) [N39.0] 06/21/2013  . BV (bacterial vaginosis) [N76.0, A49.9] 06/21/2013   History of Present Illness:: 54 year old man, states he is facing significant psycho-social stressors, mainly concerning finances, difficulty paying bills . Patient states " I guess I just collapsed", and states he felt increasingly upset on day of admission. He minimizes depressive symptoms prior to day of admission, and states " it was just a bad day". Also, he emphasizes he is now feeling "OK", not depressed . He reports that he has been struggling with financial difficulties, " keeping up with  bills". His heater recently broke down as well, compounding the above. States " I guess I just snapped " He states he hit the floor out of frustration " trying to get the rage out of me ", sustaining a metacarpal fracture.   Patient also impulsively overdosed on antihistamine. Patient  Then decided he needed help and called 911.  He states he has been struggling with some depression since his mother passed away earlier this year ( in April ). Reports he has been trying to " hold my grief inside ".  However, he denies any significant neuro-vegetative symptoms, except for mild anhedonia. At this time states he is feeling better, minimizes depression and is focused on  Being  Discharged  soon.   Associated Signs/Symptoms: Depression Symptoms:  depressed mood, anhedonia, weight loss, decreased  appetite, (Hypo) Manic Symptoms:  Denies  Anxiety Symptoms:   Denies  Psychotic Symptoms:  Denies  PTSD Symptoms: Denies  Total Time spent with patient: 45 minutes  Past Psychiatric History: No prior psychiatric admissions, has never attempted suicide, does not endorse any history of psychosis, or of PTSD . Denies any history of anxiety disorder, denies history of violence. States he has never been on any psychiatric medications in the past .   Risk to Self: Is patient at risk for suicide?: No Risk to Others:   Prior Inpatient Therapy:   Prior Outpatient Therapy:    Alcohol Screening: 1. How often do you have a drink containing alcohol?: Monthly or less 2. How many drinks containing alcohol do you have on a typical day when you are drinking?: 1 or 2 3. How often do you have six or more drinks on one occasion?: Never Preliminary Score: 0 9. Have you or someone else been injured as a result of your drinking?: No 10. Has a relative or friend or a doctor or another health worker been concerned about your drinking or suggested you cut down?: No Alcohol Use Disorder Identification Test Final Score (AUDIT): 1 Brief Intervention: AUDIT score less than 7 or less-screening does not suggest unhealthy drinking-brief intervention not indicated Substance Abuse History in the last 12 months:  Denies drug abuse, denies alcohol abuse  Consequences of Substance Abuse:  denies  Previous Psychotropic Medications:  States he has never been prescribed psychiatric medications Psychological Evaluations: No  Past Medical History: Smokes 3 cigarettes per day .  Past Medical History  Diagnosis Date  .  Hypertension   . Asthma     as child  . Arthritis     knee    Past Surgical History  Procedure Laterality Date  . Transurethral resection of prostate N/A 09/22/2013    Procedure: TRANSURETHRAL RESECTION OF THE PROSTATE WITH GYRUS INSTRUMENTS;  Surgeon: Alexis Frock, MD;  Location: [facility];  Service:  Urology;  Laterality: N/A;   Family History:  Mother died from ovarian cancer earlier this year. Father died from MI. Has three brothers . Family History  Problem Relation Age of Onset  . Cancer Mother   . Heart attack Father   . Diabetes Brother    Family Psychiatric  History:  Denies mental illness in family, no suicides in family, had an uncle who was alcoholic  Social History:  Married, has two adult children, two grandchildren,  He works as a Research scientist (medical) , denies legal issues, some financial difficulties .  History  Alcohol Use  . Yes    Comment: occassionally, 2-3 times per month     History  Drug Use No    Social History   Social History  . Marital Status: Married    Spouse Name: N/A  . Number of Children: N/A  . Years of Education: N/A   Social History Main Topics  . Smoking status: Current Every Day Smoker -- 0.50 packs/day for 30 years    Types: Cigarettes  . Smokeless tobacco: None  . Alcohol Use: Yes     Comment: occassionally, 2-3 times per month  . Drug Use: No  . Sexual Activity: Yes   Other Topics Concern  . None   Social History Narrative   Additional Social History:  Allergies:  No Known Allergies Lab Results:  Results for orders placed or performed during the hospital encounter of 09/11/15 (from the past 48 hour(s))  Comprehensive metabolic panel     Status: Abnormal   Collection Time: 09/11/15 10:20 PM  Result Value Ref Range   Sodium 136 135 - 145 mmol/L   Potassium 3.5 3.5 - 5.1 mmol/L   Chloride 101 101 - 111 mmol/L   CO2 28 22 - 32 mmol/L   Glucose, Bld 110 (H) 65 - 99 mg/dL   BUN 16 6 - 20 mg/dL   Creatinine, Ser 0.85 0.61 - 1.24 mg/dL   Calcium 9.6 8.9 - 10.3 mg/dL   Total Protein 7.5 6.5 - 8.1 g/dL   Albumin 4.0 3.5 - 5.0 g/dL   AST 24 15 - 41 U/L   ALT 18 17 - 63 U/L   Alkaline Phosphatase 75 38 - 126 U/L   Total Bilirubin 0.6 0.3 - 1.2 mg/dL   GFR calc non Af Amer >60 >60 mL/min   GFR calc Af Amer >60 >60 mL/min    Comment:  (NOTE) The eGFR has been calculated using the CKD EPI equation. This calculation has not been validated in all clinical situations. eGFR's persistently <60 mL/min signify possible Chronic Kidney Disease.    Anion gap 7 5 - 15  Ethanol (ETOH)     Status: None   Collection Time: 09/11/15 10:20 PM  Result Value Ref Range   Alcohol, Ethyl (B) <5 <5 mg/dL    Comment:        LOWEST DETECTABLE LIMIT FOR SERUM ALCOHOL IS 5 mg/dL FOR MEDICAL PURPOSES ONLY   Salicylate level     Status: None   Collection Time: 09/11/15 10:20 PM  Result Value Ref Range   Salicylate Lvl <8.8 2.8 -  30.0 mg/dL  Acetaminophen level     Status: None   Collection Time: 09/11/15 10:20 PM  Result Value Ref Range   Acetaminophen (Tylenol), Serum 12 10 - 30 ug/mL    Comment:        THERAPEUTIC CONCENTRATIONS VARY SIGNIFICANTLY. A RANGE OF 10-30 ug/mL MAY BE AN EFFECTIVE CONCENTRATION FOR MANY PATIENTS. HOWEVER, SOME ARE BEST TREATED AT CONCENTRATIONS OUTSIDE THIS RANGE. ACETAMINOPHEN CONCENTRATIONS >150 ug/mL AT 4 HOURS AFTER INGESTION AND >50 ug/mL AT 12 HOURS AFTER INGESTION ARE OFTEN ASSOCIATED WITH TOXIC REACTIONS.   CBC     Status: Abnormal   Collection Time: 09/11/15 10:20 PM  Result Value Ref Range   WBC 11.4 (H) 4.0 - 10.5 K/uL   RBC 4.64 4.22 - 5.81 MIL/uL   Hemoglobin 13.5 13.0 - 17.0 g/dL   HCT 40.5 39.0 - 52.0 %   MCV 87.3 78.0 - 100.0 fL   MCH 29.1 26.0 - 34.0 pg   MCHC 33.3 30.0 - 36.0 g/dL   RDW 12.5 11.5 - 15.5 %   Platelets 170 150 - 400 K/uL  CBG monitoring, ED     Status: None   Collection Time: 09/11/15 10:21 PM  Result Value Ref Range   Glucose-Capillary 99 65 - 99 mg/dL  Urine rapid drug screen (hosp performed) (Not at [facility])     Status: None   Collection Time: 09/11/15 10:37 PM  Result Value Ref Range   Opiates NONE DETECTED NONE DETECTED   Cocaine NONE DETECTED NONE DETECTED   Benzodiazepines NONE DETECTED NONE DETECTED   Amphetamines NONE DETECTED NONE DETECTED    Tetrahydrocannabinol NONE DETECTED NONE DETECTED   Barbiturates NONE DETECTED NONE DETECTED    Comment:        DRUG SCREEN FOR MEDICAL PURPOSES ONLY.  IF CONFIRMATION IS NEEDED FOR ANY PURPOSE, NOTIFY LAB WITHIN 5 DAYS.        LOWEST DETECTABLE LIMITS FOR URINE DRUG SCREEN Drug Class       Cutoff (ng/mL) Amphetamine      1000 Barbiturate      200 Benzodiazepine   563 Tricyclics       149 Opiates          300 Cocaine          300 THC              50     Metabolic Disorder Labs:  No results found for: HGBA1C, MPG No results found for: PROLACTIN No results found for: CHOL, TRIG, HDL, CHOLHDL, VLDL, LDLCALC  Current Medications: Current Facility-Administered Medications  Medication Dose Route Frequency Provider Last Rate Last Dose  . acetaminophen (TYLENOL) tablet 650 mg  650 mg Oral Q6H PRN Derrill Center, NP      . alum & mag hydroxide-simeth (MAALOX/MYLANTA) 200-200-20 MG/5ML suspension 30 mL  30 mL Oral Q4H PRN Derrill Center, NP      . etodolac (LODINE) capsule 300 mg  300 mg Oral BID Derrill Center, NP   300 mg at 09/12/15 1106  . magnesium hydroxide (MILK OF MAGNESIA) suspension 30 mL  30 mL Oral Daily PRN Derrill Center, NP       PTA Medications: No prescriptions prior to admission    Musculoskeletal: Strength & Muscle Tone: within normal limits Gait & Station: normal Patient leans: N/A  Psychiatric Specialty Exam: Physical Exam  Review of Systems  Constitutional: Negative.   HENT: Negative.   Eyes: Negative.   Respiratory: Negative.   Cardiovascular:  Negative.   Gastrointestinal: Negative.   Genitourinary: Negative.   Musculoskeletal: Positive for back pain.  Skin: Negative.   Neurological: Negative for seizures.  Endo/Heme/Allergies: Negative.   Psychiatric/Behavioral: Positive for depression.  All other systems reviewed and are negative.   Blood pressure 110/78, pulse 90, temperature 98.6 F (37 C), temperature source Oral, resp. rate 18, height 5'  9" (1.753 m), weight 142 lb (64.411 kg), SpO2 100 %.Body mass index is 20.96 kg/(m^2).  General Appearance: Fairly Groomed  Engineer, water::  Fair  Speech:  Normal Rate  Volume:  Normal  Mood:  states mood improved and currently minimizes depression  Affect:  Appropriate  Thought Process:  Goal Directed and Linear  Orientation:  Full (Time, Place, and Person)  Thought Content:  no hallucinations, no delusions, not internally preoccupied   Suicidal Thoughts:  No- at this time denies any suicidal or self injurious ideations  Homicidal Thoughts:  No  Memory:  recent and remote grossly intact   Judgement:  Other:  improved   Insight:  Present  Psychomotor Activity:  Normal  Concentration:  Good  Recall:  Good  Fund of Knowledge:Good  Language: Good  Akathisia:  Negative  Handed:  Right  AIMS (if indicated):     Assets:  Communication Skills Desire for Improvement Resilience  ADL's:  Intact  Cognition: WNL  Sleep:        Treatment Plan Summary: Daily contact with patient to assess and evaluate symptoms and progress in treatment, Medication management, Plan inpatient admission and medications as below   Observation Level/Precautions:  15 minute checks  Laboratory:  as needed   Psychotherapy:  Milieu, groups   Medications:  At this time not interested in starting any psychiatric medications  Consultations:  As needed   Discharge Concerns:  -  Interested in therapy -   Estimated LOS: 4 days   Other:     I certify that inpatient services furnished can reasonably be expected to improve the patient's condition.   Shanautica Forker 11/8/201611:08 AM

## 2015-09-12 NOTE — ED Notes (Signed)
[facility]

## 2015-09-12 NOTE — Progress Notes (Signed)
NUTRITION ASSESSMENT  Pt identified as at risk on the Malnutrition Screen Tool  INTERVENTION: 1. Educated patient on the importance of nutrition and encouraged intake of food and beverages. 2. Discussed weight goals. 3. Supplements: will order Boost Breeze BID, each supplement provides 250 kcal and 9 grams of protein  NUTRITION DIAGNOSIS: Unintentional weight loss related to sub-optimal intake as evidenced by pt report.   Goal: Pt to meet >/= 90% of their estimated nutrition needs.  Monitor:  PO intake  Assessment:  Pt seen for MST. Pt reports poor appetite x6-7 years which he states is due to depression. Pt denies being on medication to assist with depression during this time. He also states that since August 2016 he has lost 20 lbs. This would indicate 12% body weight loss in this time frame which is significant.  Pt does not like Ensure but he is willing to try Boost Breeze. He states that he did not like breakfast this AM and that he anticipates d/c tomorrow.  54 y.o. male  Height: Ht Readings from Last 1 Encounters:  09/12/15 5\' 9"  (1.753 m)    Weight: Wt Readings from Last 1 Encounters:  09/12/15 142 lb (64.411 kg)    Weight Hx: Wt Readings from Last 10 Encounters:  09/12/15 142 lb (64.411 kg)  09/11/15 150 lb (68.04 kg)  02/23/15 155 lb 6.4 oz (70.489 kg)  06/26/14 160 lb (72.576 kg)  09/22/13 154 lb 12.8 oz (70.217 kg)  09/17/13 154 lb 12.8 oz (70.217 kg)  06/21/13 165 lb (74.844 kg)    BMI:  Body mass index is 20.96 kg/(m^2). Pt meets criteria for normal weight based on current BMI.  Estimated Nutritional Needs: Kcal: 25-30 kcal/kg Protein: > 1 gram protein/kg Fluid: 1 ml/kcal  Diet Order: Diet regular Room service appropriate?: Yes; Fluid consistency:: Thin Pt is also offered choice of unit snacks mid-morning and mid-afternoon.  Pt is eating as desired.   Lab results and medications reviewed.       Trenton GammonJessica Samyuktha Brau, RD, LDN Inpatient Clinical  Dietitian Pager # [phone] After hours/weekend pager # [phone]

## 2015-09-12 NOTE — Progress Notes (Signed)
  Admission note:   Pt stated that he became angry because of overdue bills, heater recently went out and still grieving the loss of his mother, who passed away in April of this year. Pt reached his breaking point when his heater went out and that led him to punching the floor. Pt have a fracture to his right hand.  Pt stated that he would rather hit the floor than to hurt someone else. Pt reports that his wife is very supportive. Pt works full-time Tour managerlaying pipes. Pt denies using illicit drugs. Pt reported drinking alcohol occasionally during the holidays.  Pt denies suicidal or homicidal thoughts. Pt denies AVH. Pt reported that he needs to work on his mood and anger. Per report, pt took two hand full of vistaril. Pt denies suicidal attempt and have poor insight for tx.

## 2015-09-12 NOTE — BH Assessment (Signed)
Tele Assessment Note   Kevin Fitzgerald is a 54 y.o. male who voluntarily presents to [facility] with SI thoughts.  Pt denies plan or intent to harm self.  Pt states he had and argument with his wife and took "2 handfuls of vistaril". Per medical staff notes 2 bottles are present that are approx half full.  Pt became angry and punched the floor with his right hand, resulting in a fracture of his right hand and pt has been fitted with a short arm splint.  Pt told this Clinical research associate that he acted out for attention and denies SI and is asking to go home.  Pt denies mental health history, denies HI/AVH/SA.  Pt admits that when police arrived at his home he was SI because of the argument.  Pt was irritable during interview, stating this was a waste of time. This Clinical research associate discussed disposition with Donell Sievert, PA who recommends inpt admission.   Diagnosis: Axis I: 300.9 Unspecified mental disorder  Past Medical History:  Past Medical History  Diagnosis Date  . Hypertension   . Asthma     as child  . Arthritis     knee    Past Surgical History  Procedure Laterality Date  . Transurethral resection of prostate N/A 09/22/2013    Procedure: TRANSURETHRAL RESECTION OF THE PROSTATE WITH GYRUS INSTRUMENTS;  Surgeon: Sebastian Ache, MD;  Location: [facility] ORS;  Service: Urology;  Laterality: N/A;    Family History:  Family History  Problem Relation Age of Onset  . Cancer Mother   . Heart attack Father   . Diabetes Brother     Social History:  reports that he has been smoking Cigarettes.  He has a 15 pack-year smoking history. He does not have any smokeless tobacco history on file. He reports that he drinks alcohol. He reports that he does not use illicit drugs.  Additional Social History:  Alcohol / Drug Use Pain Medications: See MAR  Prescriptions: See MAR  Over the Counter: See MAR  History of alcohol / drug use?: No history of alcohol / drug abuse Longest period of sobriety (when/how long): Pt denies use to  this writer   CIWA: CIWA-Ar BP: 122/74 mmHg Pulse Rate: 62 COWS:    PATIENT STRENGTHS: (choose at least two) Communication skills  Allergies: No Known Allergies  Home Medications:  (Not in a hospital admission)  OB/GYN Status:  No LMP for male patient.  General Assessment Data Location of Assessment: [facility] ED TTS Assessment: In system Is this a Tele or Face-to-Face Assessment?: Tele Assessment Is this an Initial Assessment or a Re-assessment for this encounter?: Initial Assessment Marital status: Married [place] name: None  Is patient pregnant?: No Pregnancy Status: No Living Arrangements: Spouse/significant other Can pt return to current living arrangement?: Yes Admission Status: Voluntary Is patient capable of signing voluntary admission?: Yes Referral Source: MD Insurance type: SP  Medical Screening Exam [facility] Walk-in ONLY) Medical Exam completed: No Reason for MSE not completed: Other:  Crisis Care Plan Living Arrangements: Spouse/significant other Name of Psychiatrist: None  Name of Therapist: None   Education Status Is patient currently in school?: No Current Grade: None  Highest grade of school patient has completed: None  Name of school: None  Contact person: None   Risk to self with the past 6 months Suicidal Ideation: No-Not Currently/Within Last 6 Months Has patient been a risk to self within the past 6 months prior to admission? : No Suicidal Intent: No-Not Currently/Within Last 6 Months  Has patient had any suicidal intent within the past 6 months prior to admission? : No Is patient at risk for suicide?: No Suicidal Plan?: No Has patient had any suicidal plan within the past 6 months prior to admission? : No Access to Means: No What has been your use of drugs/alcohol within the last 12 months?: Pt denies chronic use; occasional alcohol  Previous Attempts/Gestures: No How many times?: 0 Other Self Harm Risks: None  Triggers for Past Attempts: None  known Intentional Self Injurious Behavior: None Family Suicide History: No Recent stressful life event(s): Conflict (Comment) (Fight with wife ) Persecutory voices/beliefs?: No Depression: Yes Depression Symptoms: Feeling angry/irritable Substance abuse history and/or treatment for substance abuse?: No Suicide prevention information given to non-admitted patients: Not applicable  Risk to Others within the past 6 months Homicidal Ideation: No Does patient have any lifetime risk of violence toward others beyond the six months prior to admission? : No Thoughts of Harm to Others: No Current Homicidal Intent: No Current Homicidal Plan: No Access to Homicidal Means: No Identified Victim: None  History of harm to others?: No Assessment of Violence: None Noted Violent Behavior Description: None  Does patient have access to weapons?: No Criminal Charges Pending?: No Does patient have a court date: No Is patient on probation?: No  Psychosis Hallucinations: None noted Delusions: None noted  Mental Status Report Appearance/Hygiene: Disheveled, In hospital gown Eye Contact: Fair Motor Activity: Unremarkable Speech: Logical/coherent Level of Consciousness: Alert, Irritable Mood: Irritable Affect: Irritable Anxiety Level: None Thought Processes: Coherent, Relevant Judgement: Unimpaired Orientation: Person, Place, Time, Situation Obsessive Compulsive Thoughts/Behaviors: None  Cognitive Functioning Concentration: Normal Memory: Recent Intact, Remote Intact IQ: Average Insight: Fair Impulse Control: Fair Appetite: Good Weight Loss: 0 Weight Gain: 0 Sleep: No Change Total Hours of Sleep: 6 Vegetative Symptoms: None  ADLScreening [facility] Assessment Services) Patient's cognitive ability adequate to safely complete daily activities?: Yes Patient able to express need for assistance with ADLs?: Yes Independently performs ADLs?: Yes (appropriate for developmental age)  Prior  Inpatient Therapy Prior Inpatient Therapy: No Prior Therapy Dates: None  Prior Therapy Facilty/Provider(s): None  Reason for Treatment: None   Prior Outpatient Therapy Prior Outpatient Therapy: No Prior Therapy Dates: None  Prior Therapy Facilty/Provider(s): None  Reason for Treatment: None  Does patient have an ACCT team?: No Does patient have Intensive In-House Services?  : No Does patient have [facility] services? : No Does patient have P4CC services?: No  ADL Screening (condition at time of admission) Patient's cognitive ability adequate to safely complete daily activities?: Yes Is the patient deaf or have difficulty hearing?: No Does the patient have difficulty seeing, even when wearing glasses/contacts?: No Does the patient have difficulty concentrating, remembering, or making decisions?: No Patient able to express need for assistance with ADLs?: Yes Does the patient have difficulty dressing or bathing?: No Independently performs ADLs?: Yes (appropriate for developmental age) Does the patient have difficulty walking or climbing stairs?: No Weakness of Legs: None Weakness of Arms/Hands: None  Home Assistive Devices/Equipment Home Assistive Devices/Equipment: None  Therapy Consults (therapy consults require a physician order) PT Evaluation Needed: No OT Evalulation Needed: No SLP Evaluation Needed: No Abuse/Neglect Assessment (Assessment to be complete while patient is alone) Physical Abuse: Denies Verbal Abuse: Denies Sexual Abuse: Denies Exploitation of patient/patient's resources: Denies Self-Neglect: Denies Values / Beliefs Cultural Requests During Hospitalization: None Spiritual Requests During Hospitalization: None Consults Spiritual Care Consult Needed: No Social Work Consult Needed: No Merchant navy officerAdvance Directives (For Healthcare) Does patient  have an advance directive?: No Would patient like information on creating an advanced directive?: No - patient declined  information    Additional Information 1:1 In Past 12 Months?: No CIRT Risk: No Elopement Risk: No Does patient have medical clearance?: Yes     Disposition:  Disposition Disposition of Patient: Referred to (Per Donell Sievert, PA d/c with referrals ) Patient referred to: Other (Comment) (Per Donell Sievert, PA d/c with referrals )  Murrell Redden 09/12/2015 4:45 AM

## 2015-09-12 NOTE — ED Provider Notes (Signed)
Patient signed out to me by Dr. Preston FleetingGlick. Patient had been seen for intentional overdose. He was observed for the recommended 6 hours and has had no change in his mental status or vital signs. He is now medically clear. Because of intentional drug overdose for suicidal ideation, will require psychiatric evaluation.  Filed Vitals:   09/12/15 0130  BP: 127/72  Pulse: 67  Temp:   Resp: 20     Gilda Creasehristopher J Weylyn Ricciuti, MD 09/12/15 [phone]

## 2015-09-12 NOTE — [facility] Suicide Risk Assessment (Signed)
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 MDD (major depressive disorder) (HCC) [F32.9] 09/12/2015  . BPH (benign prostatic hyperplasia) [N40.0] 06/22/2013  . Dysuria [R30.0] 06/21/2013  . UTI (lower urinary tract infection) [N39.0] 06/21/2013  . BV (bacterial vaginosis) [N76.0, A49.9] 06/21/2013     Continued Clinical Symptoms:  Alcohol Use Disorder Identification Test Final Score (AUDIT): 1 The "Alcohol Use Disorders Identification Test", Guidelines for Use in Primary Care, Second Edition.  World Science [employer]Health [employer] [facility](WHO). Score between 0-7:  no or low risk or alcohol related problems. Score between 8-15:  moderate risk of alcohol related problems. Score between 16-19:  high risk of alcohol related problems. Score 20 or above:  warrants further diagnostic evaluation for alcohol dependence and treatment.   CLINICAL FACTORS:   54 year old man, denies prior psychiatric history, facing stressors to include death of mother a few months ago, and financial difficulties. On day of admission, in the context of marital argument and finding out his heater had broken, he impulsively overdosed on antihistamine OTC and punched the floor , leading to a hand fracture. At this time reports improvement and minimizes any current depression.    Psychiatric Specialty Exam: Physical Exam  ROS  Blood pressure 110/78, pulse 90, temperature 98.6 F (37 C), temperature source Oral, resp. rate 18, height 5'  9" (1.753 m), weight 142 lb (64.411 kg), SpO2 100 %.Body mass index is 20.96 kg/(m^2).   See admit Note MSE                                                        COGNITIVE FEATURES THAT CONTRIBUTE TO RISK:  Closed-mindedness    SUICIDE RISK:   Moderate:  Frequent suicidal ideation with limited intensity, and duration, some specificity in terms of plans, no associated intent, good self-control, limited dysphoria/symptomatology, some risk factors present, and identifiable protective factors, including available and accessible social support.  PLAN OF CARE: Patient will be admitted to inpatient psychiatric unit for stabilization and safety. Will provide and encourage milieu participation. Provide medication management and maked adjustments as needed.  Will follow daily.    Medical Decision Making:  Established Problem, Stable/Improving (1), Review of Psycho-Social Stressors (1), Review or order clinical lab tests (1) and Review of Medication Regimen & Side Effects (2)  I certify that inpatient services furnished can reasonably be expected to improve the patient's condition.   COBOS, FERNANDO 09/12/2015, 11:39 AM

## 2015-09-12 NOTE — Progress Notes (Signed)
Recreation Therapy Notes  Animal-Assisted Activity (AAA) Program Checklist/Progress Notes Patient Eligibility Criteria Checklist & Daily Group note for Rec Tx Intervention  Date: 11.08.2016 Time: 2:45pm Location: [address] Hall Dayroom    AAA/T Program Assumption of Risk Form signed by Patient/ or Parent Legal Guardian yes  Patient is free of allergies or sever asthma yes  Patient reports no fear of animals yes  Patient reports no history of cruelty to animals yes  Patient understands his/her participation is voluntary yes  Patient washes hands before animal contact yes  Patient washes hands after animal contact yes  Behavioral Response: Appropriate   Education: Hand Washing, Appropriate Animal Interaction   Education Outcome: Acknowledges education.   Clinical Observations/Feedback: Patient engaged appropriately with therapy dog and peers during session.    Aundrea Horace L Phoenix Riesen, LRT/CTRS        Alayasia Breeding L 09/12/2015 3:16 PM 

## 2015-09-12 NOTE — Tx Team (Signed)
Initial Interdisciplinary Treatment Plan   PATIENT STRESSORS: Financial difficulties   PATIENT STRENGTHS: Ability for insight Capable of independent living   PROBLEM LIST: Problem List/Patient Goals Date to be addressed Date deferred Reason deferred Estimated date of resolution  "anger" 09/12/15     "mood" 09/12/15                                                DISCHARGE CRITERIA:  Ability to meet basic life and health needs Adequate post-discharge living arrangements  PRELIMINARY DISCHARGE PLAN: Attend aftercare/continuing care group Attend PHP/IOP  PATIENT/FAMIILY INVOLVEMENT: This treatment plan has been presented to and reviewed with the patient, Kevin Fitzgerald, and/or family member.  The patient and family have been given the opportunity to ask questions and make suggestions.  Emera Bussie L 09/12/2015, 9:07 AM

## 2015-09-12 NOTE — Tx Team (Signed)
Interdisciplinary Treatment Plan Update (Adult) Date: 09/12/2015   Date: 09/12/2015 11:49 AM  Progress in Treatment:  Attending groups: Pt is new to milieu, continuing to assess  Participating in groups: Pt is new to milieu, continuing to assess  Taking medication as prescribed: Yes  Tolerating medication: Yes  Family/Significant othe contact made: No, CSW assessing for appropriate contact Patient understands diagnosis: Continuing to assess Discussing patient identified problems/goals with staff: Yes  Medical problems stabilized or resolved: Yes  Denies suicidal/homicidal ideation: Yes Patient has not harmed self or Others: Yes   New problem(s) identified: None identified at this time.   Discharge Plan or Barriers: CSW will assess for appropriate discharge plan and relevant barriers.   Additional comments: n/a   Reason for Continuation of Hospitalization:  Depression Medication stabilization  Estimated length of stay: 3-5 days  Review of initial/current patient goals per problem list:   1.  Goal(s): Patient will participate in aftercare plan  Met:  No  Target date: 3-5 days from date of admission   As evidenced by: Patient will participate within aftercare plan AEB aftercare provider and housing plan at discharge being identified.  09/12/15: CSW to work with Pt to assess for appropriate discharge plan and faciliate appointments and referrals as needed prior to d/c.  2.  Goal (s): Patient will exhibit decreased depressive symptoms and suicidal ideations.  Met:  No  Target date: 3-5 days from date of admission   As evidenced by: Patient will utilize self rating of depression at 3 or below and demonstrate decreased signs of depression or be deemed stable for discharge by MD. 09/12/15: Pt was admitted with symptoms of increased depression. Pt continues to present with flat affect and depressive symptoms.  Pt will demonstrate decreased symptoms of depression and rate depression  at 3/10 or lower prior to discharge.   Attendees:  Patient:    Family:    Physician: Dr. Parke Poisson, MD  09/12/2015 11:49 AM  Nursing: Lars Pinks, RN Case manager  09/12/2015 11:49 AM  Clinical Social Worker Norman Clay, MSW 09/12/2015 11:49 AM  Other: Jake Bathe Liasion 09/12/2015 11:49 AM  Clinical: Darrol Angel RN 09/12/2015 11:49 AM  Other: , RN Charge Nurse 09/12/2015 11:49 AM  Other:     Peri Maris, Latanya Presser MSW

## 2015-09-12 NOTE — [facility] Counselor (Signed)
CSW attempted to meet with Pt to complete PSA, however Pt was sleeping and would not respond to prompting.   Chad CordialLauren Carter, LCSWA Clinical Social Work [phone]

## 2015-09-12 NOTE — [facility] Group Notes (Signed)
[facility] LCSW Group Therapy 09/12/2015 1:15 PM  Type of Therapy: Group Therapy- Feelings about Diagnosis  Pt did not attend, declined invitation.   Chad CordialLauren Carter, LCSWA 09/12/2015 2:17 PM

## 2015-09-13 NOTE — Progress Notes (Signed)
[facility] MD Progress Note  09/13/2015 4:53 PM Kevin Fitzgerald  MRN:  [id number] Subjective:  Patient states he is feeling better today. He minimizes any depression or severe anxiety. He continues to focus on being discharged home soon. At this time denies medication side effects. Objective :I  have discussed case with treatment team and have met with patient. Patient reporting improved mood and today presenting with full range of affect . No disruptive or agitated behaviors on unit- now  going to groups more consistently , visible in day room. Patient reports some pain on fracture site, but does not appear uncomfortable or in any acute distress . Has preserved perfusion and sensation distally/ on digits .  As noted in prior notes, patient not currently interested in a standing psychiatric medication trial- states " I don't need any, it was a bad day, and now I am better. I am not depressed or anything"   Principal Problem: Adjustment disorder with depressed mood Diagnosis:   Patient Active Problem List   Diagnosis Date Noted  . MDD (major depressive disorder) ([place]) [F32.9] 09/12/2015  . Adjustment disorder with depressed mood [F43.21] 09/12/2015  . BPH (benign prostatic hyperplasia) [N40.0] 06/22/2013  . Dysuria [R30.0] 06/21/2013  . UTI (lower urinary tract infection) [N39.0] 06/21/2013  . BV (bacterial vaginosis) [N76.0, A49.9] 06/21/2013   Total Time spent with patient: 25 minutes     Past Medical History:  Past Medical History  Diagnosis Date  . Hypertension   . Asthma     as child  . Arthritis     knee    Past Surgical History  Procedure Laterality Date  . Transurethral resection of prostate N/A 09/22/2013    Procedure: TRANSURETHRAL RESECTION OF THE PROSTATE WITH GYRUS INSTRUMENTS;  Surgeon: Alexis Frock, MD;  Location: [facility];  Service: Urology;  Laterality: N/A;   Family History:  Family History  Problem Relation Age of Onset  . Cancer Mother   . Heart attack Father    . Diabetes Brother     Social History:  History  Alcohol Use  . Yes    Comment: occassionally, 2-3 times per month     History  Drug Use No    Social History   Social History  . Marital Status: Married    Spouse Name: N/A  . Number of Children: N/A  . Years of Education: N/A   Social History Main Topics  . Smoking status: Current Every Day Smoker -- 0.50 packs/day for 30 years    Types: Cigarettes  . Smokeless tobacco: None  . Alcohol Use: Yes     Comment: occassionally, 2-3 times per month  . Drug Use: No  . Sexual Activity: Yes   Other Topics Concern  . None   Social History Narrative   Additional Social History:   Sleep:  Good   Appetite:   Improving   Current Medications: Current Facility-Administered Medications  Medication Dose Route Frequency Provider Last Rate Last Dose  . acetaminophen (TYLENOL) tablet 650 mg  650 mg Oral Q6H PRN Derrill Center, NP   650 mg at 09/13/15 0516  . alum & mag hydroxide-simeth (MAALOX/MYLANTA) 200-200-20 MG/5ML suspension 30 mL  30 mL Oral Q4H PRN Derrill Center, NP      . etodolac (LODINE) capsule 300 mg  300 mg Oral BID Derrill Center, NP   300 mg at 09/13/15 1622  . feeding supplement (BOOST / RESOURCE BREEZE) liquid 1 Container  1 Container Oral BID BM  Rosezetta Schlatter, RD   1 Container at 09/13/15 1257  . magnesium hydroxide (MILK OF MAGNESIA) suspension 30 mL  30 mL Oral Daily PRN Derrill Center, NP      . traZODone (DESYREL) tablet 50 mg  50 mg Oral QHS PRN Jenne Campus, MD   50 mg at 09/12/15 2133    Lab Results:  Results for orders placed or performed during the hospital encounter of 09/11/15 (from the past 48 hour(s))  Comprehensive metabolic panel     Status: Abnormal   Collection Time: 09/11/15 10:20 PM  Result Value Ref Range   Sodium 136 135 - 145 mmol/L   Potassium 3.5 3.5 - 5.1 mmol/L   Chloride 101 101 - 111 mmol/L   CO2 28 22 - 32 mmol/L   Glucose, Bld 110 (H) 65 - 99 mg/dL   BUN 16 6 - 20 mg/dL    Creatinine, Ser 0.85 0.61 - 1.24 mg/dL   Calcium 9.6 8.9 - 10.3 mg/dL   Total Protein 7.5 6.5 - 8.1 g/dL   Albumin 4.0 3.5 - 5.0 g/dL   AST 24 15 - 41 U/L   ALT 18 17 - 63 U/L   Alkaline Phosphatase 75 38 - 126 U/L   Total Bilirubin 0.6 0.3 - 1.2 mg/dL   GFR calc non Af Amer >60 >60 mL/min   GFR calc Af Amer >60 >60 mL/min    Comment: (NOTE) The eGFR has been calculated using the CKD EPI equation. This calculation has not been validated in all clinical situations. eGFR's persistently <60 mL/min signify possible Chronic Kidney Disease.    Anion gap 7 5 - 15  Ethanol (ETOH)     Status: None   Collection Time: 09/11/15 10:20 PM  Result Value Ref Range   Alcohol, Ethyl (B) <5 <5 mg/dL    Comment:        LOWEST DETECTABLE LIMIT FOR SERUM ALCOHOL IS 5 mg/dL FOR MEDICAL PURPOSES ONLY   Salicylate level     Status: None   Collection Time: 09/11/15 10:20 PM  Result Value Ref Range   Salicylate Lvl <6.5 2.8 - 30.0 mg/dL  Acetaminophen level     Status: None   Collection Time: 09/11/15 10:20 PM  Result Value Ref Range   Acetaminophen (Tylenol), Serum 12 10 - 30 ug/mL    Comment:        THERAPEUTIC CONCENTRATIONS VARY SIGNIFICANTLY. A RANGE OF 10-30 ug/mL MAY BE AN EFFECTIVE CONCENTRATION FOR MANY PATIENTS. HOWEVER, SOME ARE BEST TREATED AT CONCENTRATIONS OUTSIDE THIS RANGE. ACETAMINOPHEN CONCENTRATIONS >150 ug/mL AT 4 HOURS AFTER INGESTION AND >50 ug/mL AT 12 HOURS AFTER INGESTION ARE OFTEN ASSOCIATED WITH TOXIC REACTIONS.   CBC     Status: Abnormal   Collection Time: 09/11/15 10:20 PM  Result Value Ref Range   WBC 11.4 (H) 4.0 - 10.5 K/uL   RBC 4.64 4.22 - 5.81 MIL/uL   Hemoglobin 13.5 13.0 - 17.0 g/dL   HCT 40.5 39.0 - 52.0 %   MCV 87.3 78.0 - 100.0 fL   MCH 29.1 26.0 - 34.0 pg   MCHC 33.3 30.0 - 36.0 g/dL   RDW 12.5 11.5 - 15.5 %   Platelets 170 150 - 400 K/uL  CBG monitoring, ED     Status: None   Collection Time: 09/11/15 10:21 PM  Result Value Ref Range    Glucose-Capillary 99 65 - 99 mg/dL  Urine rapid drug screen (hosp performed) (Not at [facility])  Status: None   Collection Time: 09/11/15 10:37 PM  Result Value Ref Range   Opiates NONE DETECTED NONE DETECTED   Cocaine NONE DETECTED NONE DETECTED   Benzodiazepines NONE DETECTED NONE DETECTED   Amphetamines NONE DETECTED NONE DETECTED   Tetrahydrocannabinol NONE DETECTED NONE DETECTED   Barbiturates NONE DETECTED NONE DETECTED    Comment:        DRUG SCREEN FOR MEDICAL PURPOSES ONLY.  IF CONFIRMATION IS NEEDED FOR ANY PURPOSE, NOTIFY LAB WITHIN 5 DAYS.        LOWEST DETECTABLE LIMITS FOR URINE DRUG SCREEN Drug Class       Cutoff (ng/mL) Amphetamine      1000 Barbiturate      200 Benzodiazepine   270 Tricyclics       623 Opiates          300 Cocaine          300 THC              50     Physical Findings: AIMS: Facial and Oral Movements Muscles of Facial Expression: None, normal Lips and Perioral Area: None, normal Jaw: None, normal Tongue: None, normal,Extremity Movements Upper (arms, wrists, hands, fingers): None, normal Lower (legs, knees, ankles, toes): None, normal, Trunk Movements Neck, shoulders, hips: None, normal, Overall Severity Severity of abnormal movements (highest score from questions above): None, normal Incapacitation due to abnormal movements: None, normal Patient's awareness of abnormal movements (rate only patient's report): No Awareness, Dental Status Current problems with teeth and/or dentures?: No Does patient usually wear dentures?: No  CIWA:    COWS:     Musculoskeletal: Strength & Muscle Tone: within normal limits Gait & Station: normal Patient leans: N/A  Psychiatric Specialty Exam: ROS denies headache, denies chest pain, no shortness of breath, some pain on fractured hand - currently bandaged, immobilized   Blood pressure 126/80, pulse 90, temperature 98 F (36.7 C), temperature source Oral, resp. rate 18, height $RemoveBe'5\' 9"'odPIFRqSo$  (1.753 m), weight 142  lb (64.411 kg), SpO2 100 %.Body mass index is 20.96 kg/(m^2).  General Appearance: improved grooming  Eye Contact::  Good  Speech:  Normal Rate  Volume:  Normal  Mood:  improved, denies any depression  Affect:  Appropriate and reactive   Thought Process:  Goal Directed and Linear  Orientation:  Full (Time, Place, and Person)  Thought Content:  denies hallucinations, no delusions, not internally preoccupied   Suicidal Thoughts:  No- denies any suicidal ideations or any self injurious ideations  Homicidal Thoughts:  No  Memory:  recent and remote grossly intact   Judgement:  Other:  improving   Insight:  improving   Psychomotor Activity:  Normal  Concentration:  Good  Recall:  Good  Fund of Knowledge:Good  Language: Good  Akathisia:  Negative  Handed:  Right  AIMS (if indicated):     Assets:  Communication Skills Desire for Improvement Resilience  ADL's:  Intact  Cognition: WNL  Sleep:  Number of Hours: 6  Assessment - patient currently improved, at this time denies depression and presents with fuller range of affect, euthymic. Behavior on unit calm, in good control, and has started to participate more in milieu activities. Not interested in any standing psychiatric medications at this time. Focused on being discharged home- looking forward to reuniting with his family. Treatment Plan Summary: Daily contact with patient to assess and evaluate symptoms and progress in treatment, Medication management, Plan inpatient admission and medications / treatment as below  Encourage  ongoing  Group, milieu participation to work on coping skills and symptom reduction On no standing psychiatric medications at this time Continue Lodine for pain, as needed  Continue Trazodone 50 mgrs QHS PRN for insomnia as needed  On Boost /Resource dietary supplements to help address weight loss, nutritional status Treatment team working on disposition options  Tanessa Tidd, [place] 09/13/2015, 4:53 PM

## 2015-09-13 NOTE — Progress Notes (Signed)
   D: Pt was pleasant, but didn't attend group. Informed the writer that he's ready for discharge and asked if he is scheduled to be discharged tomorrow. Pt read the note and informed the writer that there was nothing noted in reference to discharge. However, encouraged pt to speak to his social worker today, as note stated that he didn't meet with her yesterday due to sleeping. Pt has no other questions or concerns.     A:  Support and encouragement was offered. 15 min checks continued for safety.  R: Pt remains safe.

## 2015-09-13 NOTE — Progress Notes (Signed)
Pt did not attend the evening wrap up group. Pt was in his bed sleeping.

## 2015-09-13 NOTE — [facility] Group Notes (Signed)
,  [facility] LCSW Group Therapy 09/13/2015  1:15 PM Type of Therapy: Group Therapy Participation Level: Active  Participation Quality: Attentive, Sharing and Supportive  Affect: Animated  Cognitive: Alert and Oriented  Insight: Developing/Improving and Engaged  Engagement in Therapy: Developing/Improving and Engaged  Modes of Intervention: Clarification, Confrontation, Discussion, Education, Exploration, Limit-setting, Orientation, Problem-solving, Rapport Building, Dance movement psychotherapisteality Testing, Socialization and Support  Summary of Progress/Problems: The topic for group today was emotional regulation. This group focused on both positive and negative emotion identification and allowed group members to process ways to identify feelings, regulate negative emotions, and find healthy ways to manage internal/external emotions. Group members were asked to reflect on a time when their reaction to an emotion led to a negative outcome and explored how alternative responses using emotion regulation would have benefited them. Group members were also asked to discuss a time when emotion regulation was utilized when a negative emotion was experienced.  Patient discussed at length during group sharing with other group members coping mechanisms the patient utilizes to cope with adverse life experiences, such as changing his perception of of a situation from a problem to a favorable situation and always looking at the positive.  Samuella BruinKristin Esterlene Atiyeh, MSW, [employer]CS[employer] Clinical Social Worker [facility] [phone]

## 2015-09-13 NOTE — Progress Notes (Signed)
Recreation Therapy Notes  Date: 09/13/15 Time: 930 Location: 300 Hall Dayroom  Group Topic: Stress Management  Goal Area(s) Addresses:  Patient will verbalize importance of using healthy stress management.  Patient will identify positive emotions associated with healthy stress management.   Behavioral Response: Engaged  Intervention: Stress Management  Activity :  Progressive Muscle Relaxation.  LRT introduced and educated patients on stress management technique of progressive muscle relaxation.  A script was used to deliver the technique to patients and patients were asked to follow script read allowed by LRT to engage in practicing the stress management technique.    Education:  Stress Management, Discharge Planning.   Education Outcome: Acknowledges edcuation/In group clarification offered/Needs additional education  Clinical Observations/Feedback:  Patient attended group.    Caroll RancherMarjette Presley Gora, LRT/CTRS  Caroll RancherLindsay, Eloise Mula A 09/13/2015 3:58 PM

## 2015-09-13 NOTE — Progress Notes (Signed)
D: Pt presents anxious on approach. Pt requesting to discharge home today. Pt verbalized that moving forward, he will use his coping skills and walked away. Pt stated that he usually walks away from issues and conflict. Pt in denial about suicidal attempt and ingesting vistaril as an OD attempt. Pt continues to state that he needs to work on his anger. Pt denies suicidal thoughts. Pt denies depression. Pt less irritable today. P A: Medications administered as ordered per MD. Verbal support given. Pt encouraged to attend groups. 15 minute checks performed for safety. R: Pt safety maintained at this time. Pt compliant with tx.

## 2015-09-13 NOTE — [facility] Group Notes (Addendum)
   [facility] L[facility] Discharge Planning Group Note  09/13/2015  8:45 AM   Participation Quality: Alert, Appropriate and Oriented  Mood/Affect: Focused and appropriate  Depression Rating: 0  Anxiety Rating: 0  Thoughts of Suicide: Pt denies SI/HI  Will you contract for safety? Yes  Current AVH: Pt denies  Plan for Discharge/Comments: Pt attended discharge planning group and actively participated in group. CSW provided pt with today's workbook. Patient was focused on patient's stated goal of discharging today and reports that he is returning home with outpatient follow-up at discharge.  Transportation Means: Pt reports access to transportation  Supports: No supports mentioned at this time  Samuella BruinKristin Santosh Petter, MSW, [employer]CSWA Clinical Social Worker [facility] [phone]
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
## 2015-09-14 DIAGNOSIS — F4323 Adjustment disorder with mixed anxiety and depressed mood: Secondary | ICD-10-CM | POA: Insufficient documentation

## 2015-09-14 MED ORDER — TRAZODONE HCL 50 MG PO TABS: 50.0000 mg | ORAL_TABLET | Freq: Every evening | ORAL | Status: DC | PRN

## 2015-09-14 NOTE — Tx Team (Signed)
Interdisciplinary Treatment Plan Update (Adult) Date: 09/14/2015   Date: 09/14/2015 9:38 AM  Progress in Treatment:  Attending groups: Yes Participating in groups: Yes Taking medication as prescribed: Yes  Tolerating medication: Yes  Family/Significant othe contact made: No, CSW assessing for appropriate contact Patient understands diagnosis: Continuing to assess Discussing patient identified problems/goals with staff: Yes  Medical problems stabilized or resolved: Yes  Denies suicidal/homicidal ideation: Yes Patient has not harmed self or Others: Yes   New problem(s) identified: None identified at this time.   Discharge Plan or Barriers: Patient plans to return home to follow up with outpatient services  Additional comments: n/a   Reason for Continuation of Hospitalization:  Depression Medication stabilization  Estimated length of stay: Discharge anticipated for today 11/10  Review of initial/current patient goals per problem list:   1.  Goal(s): Patient will participate in aftercare plan  Met:  Yes  Target date: 3-5 days from date of admission   As evidenced by: Patient will participate within aftercare plan AEB aftercare provider and housing plan at discharge being identified.  09/12/15: CSW to work with Pt to assess for appropriate discharge plan and faciliate appointments and referrals as needed prior to d/c. 11/10: Goal met: Patient plans to return home, declined referral for outpatient services.  2.  Goal (s): Patient will exhibit decreased depressive symptoms and suicidal ideations.  Met:  Yes  Target date: 3-5 days from date of admission   As evidenced by: Patient will utilize self rating of depression at 3 or below and demonstrate decreased signs of depression or be deemed stable for discharge by MD. 09/12/15: Pt was admitted with symptoms of increased depression. Pt continues to present with flat affect and depressive symptoms.  Pt will demonstrate decreased  symptoms of depression and rate depression at 3/10 or lower prior to discharge. 11/10: Goal met: Patient denies depression or SI today.    Attendees: Patient:    Family:    Physician: Dr. Parke Poisson; Dr. Sabra Heck 09/14/2015 9:30 AM  Nursing: Festus Aloe, Mayra Neer, [address] Grayland Ormond, RN 09/14/2015 9:30 AM  Clinical Social Worker: Tilden Fossa, Peri Maris, LCSWA 09/14/2015 9:30 AM  Other: Nira Conn Smart LCSW 09/14/2015 9:30 AM  Other: Ricky Ala, NP 09/14/2015 9:30 AM  Other: Lars Pinks, Case Manager 09/14/2015 9:30 AM  Other:                Tilden Fossa, MSW, [place] Worker [facility] [phone]

## 2015-09-14 NOTE — Discharge Summary (Signed)
Physician Discharge Summary Note  Patient:  Kevin Fitzgerald is an 54 y.o., male MRN:  [id number] DOB:  [birth year] Patient phone:  [phone] (home)  Patient address:   [address] [place] [address],  Total Time spent with patient: 30 minutes  Date of Admission:  09/12/2015 Date of Discharge: 09/14/2015  Reason for Admission:  PER HPI-54 year old man, states he is facing significant psycho-social stressors, mainly concerning finances, difficulty paying bills . Patient states " I guess I just collapsed", and states he felt increasingly upset on day of admission. He minimizes depressive symptoms prior to day of admission, and states " it was just a bad day". Also, he emphasizes he is now feeling "OK", not depressed . He reports that he has been struggling with financial difficulties, " keeping up with bills". His heater recently broke down as well, compounding the above. States " I guess I just snapped " He states he hit the floor out of frustration " trying to get the rage out of me ", sustaining a metacarpal fracture. Patient also impulsively overdosed on antihistamine. Patient Then decided he needed help and called 911.  He states he has been struggling with some depression since his mother passed away earlier this year ( in April ). Reports he has been trying to " hold my grief inside ". However, he denies any significant neuro-vegetative symptoms, except for mild anhedonia. At this time states he is feeling better, minimizes depression and is focused on Being Discharged soon  Principal Problem: Adjustment disorder with depressed mood Discharge Diagnoses: Patient Active Problem List   Diagnosis Date Noted  . MDD (major depressive disorder) ([place]) [F32.9] 09/12/2015  . Adjustment disorder with depressed mood [F43.21] 09/12/2015  . BPH (benign prostatic hyperplasia) [N40.0] 06/22/2013  . Dysuria [R30.0] 06/21/2013  . UTI (lower urinary tract infection) [N39.0] 06/21/2013  .  BV (bacterial vaginosis) [N76.0, A49.9] 06/21/2013    Musculoskeletal: Strength & Muscle Tone: within normal limits Gait & Station: normal Patient leans: N/A  Psychiatric Specialty Exam: SEE SRA BY MD Physical Exam  Nursing note and vitals reviewed. Constitutional: He is oriented to person, place, and time. He appears well-developed and well-nourished.  HENT:  Head: Normocephalic.  Neck: Normal range of motion. Neck supple.  Cardiovascular: Normal rate and regular rhythm.   Respiratory: No stridor.  Musculoskeletal: Normal range of motion.  Neurological: He is alert and oriented to person, place, and time.  Skin: Skin is warm and dry.  Psychiatric: He has a normal mood and affect. His behavior is normal. Thought content normal.    Review of Systems  Psychiatric/Behavioral: Negative for suicidal ideas. Depression: stable. Substance abuse: stable. Nervous/anxious: stable.   All other systems reviewed and are negative.   Blood pressure 115/79, pulse 83, temperature 97.5 F (36.4 C), temperature source Oral, resp. rate 16, height $RemoveBe'5\' 9"'KXZcjvhrO$  (1.753 m), weight 64.411 kg (142 lb), SpO2 100 %.Body mass index is 20.96 kg/(m^2).  Have you used any form of tobacco in the last 30 days? (Cigarettes, Smokeless Tobacco, Cigars, and/or Pipes): Yes  Has this patient used any form of tobacco in the last 30 days? (Cigarettes, Smokeless Tobacco, Cigars, and/or Pipes) Yes, A prescription for an FDA-approved tobacco cessation medication was offered at discharge and the patient refused  Past Medical History:  Past Medical History  Diagnosis Date  . Hypertension   . Asthma     as child  . Arthritis     knee    Past Surgical History  Procedure Laterality Date  . Transurethral resection of prostate N/A 09/22/2013    Procedure: TRANSURETHRAL RESECTION OF THE PROSTATE WITH GYRUS INSTRUMENTS;  Surgeon: Alexis Frock, MD;  Location: [facility];  Service: Urology;  Laterality: N/A;   Family History:  Family  History  Problem Relation Age of Onset  . Cancer Mother   . Heart attack Father   . Diabetes Brother    Social History:  History  Alcohol Use  . Yes    Comment: occassionally, 2-3 times per month     History  Drug Use No    Social History   Social History  . Marital Status: Married    Spouse Name: N/A  . Number of Children: N/A  . Years of Education: N/A   Social History Main Topics  . Smoking status: Current Every Day Smoker -- 0.50 packs/day for 30 years    Types: Cigarettes  . Smokeless tobacco: None  . Alcohol Use: Yes     Comment: occassionally, 2-3 times per month  . Drug Use: No  . Sexual Activity: Yes   Other Topics Concern  . None   Social History Narrative     Risk to Self: Is patient at risk for suicide?: No What has been your use of drugs/alcohol within the last 12 months?: Denies Risk to Others:  no  Prior Inpatient Therapy:  no Prior Outpatient Therapy:  yes  Level of Care:  [place] was admitted for Adjustment disorder with depressed mood and crisis management.  He was treated discharged with the medications listed below under Medication List.  Medical problems were identified and treated as needed.  Home medications were restarted as appropriate.  Improvement was monitored by observation and Kathryne Sharper daily report of symptom reduction.  Emotional and mental status was monitored by daily self-inventory reports completed by Kathryne Sharper and clinical staff.         Kathryne Sharper was evaluated by the treatment team for stability and plans for continued recovery upon discharge.  Kathryne Sharper motivation was an integral factor for scheduling further treatment.  Employment, transportation, bed availability, health status, family support, and any pending legal issues were also considered during his/her hospital stay.  He  was offered further treatment options upon discharge including but not limited to  Residential, Intensive Outpatient, and Outpatient treatment.  Kathryne Sharper will follow up with the services as listed below under Follow Up Information.     Upon completion of this admission the patient was both mentally and medically stable for discharge denying suicidal/homicidal ideation, auditory/visual/tactile hallucinations, delusional thoughts and paranoia.      Consults:  psychiatry  Significant Diagnostic Studies:  labs: Reviwed  Discharge Vitals:   Blood pressure 115/79, pulse 83, temperature 97.5 F (36.4 C), temperature source Oral, resp. rate 16, height $RemoveBe'5\' 9"'psMQfJDZU$  (1.753 m), weight 64.411 kg (142 lb), SpO2 100 %. Body mass index is 20.96 kg/(m^2). Lab Results:   Results for orders placed or performed during the hospital encounter of 09/11/15 (from the past 72 hour(s))  Comprehensive metabolic panel     Status: Abnormal   Collection Time: 09/11/15 10:20 PM  Result Value Ref Range   Sodium 136 135 - 145 mmol/L   Potassium 3.5 3.5 - 5.1 mmol/L   Chloride 101 101 - 111 mmol/L   CO2 28 22 - 32 mmol/L   Glucose, Bld 110 (H) 65 - 99 mg/dL   BUN 16 6 - 20  mg/dL   Creatinine, Ser 0.85 0.61 - 1.24 mg/dL   Calcium 9.6 8.9 - 10.3 mg/dL   Total Protein 7.5 6.5 - 8.1 g/dL   Albumin 4.0 3.5 - 5.0 g/dL   AST 24 15 - 41 U/L   ALT 18 17 - 63 U/L   Alkaline Phosphatase 75 38 - 126 U/L   Total Bilirubin 0.6 0.3 - 1.2 mg/dL   GFR calc non Af Amer >60 >60 mL/min   GFR calc Af Amer >60 >60 mL/min    Comment: (NOTE) The eGFR has been calculated using the CKD EPI equation. This calculation has not been validated in all clinical situations. eGFR's persistently <60 mL/min signify possible Chronic Kidney Disease.    Anion gap 7 5 - 15  Ethanol (ETOH)     Status: None   Collection Time: 09/11/15 10:20 PM  Result Value Ref Range   Alcohol, Ethyl (B) <5 <5 mg/dL    Comment:        LOWEST DETECTABLE LIMIT FOR SERUM ALCOHOL IS 5 mg/dL FOR MEDICAL PURPOSES ONLY   Salicylate level      Status: None   Collection Time: 09/11/15 10:20 PM  Result Value Ref Range   Salicylate Lvl <7.5 2.8 - 30.0 mg/dL  Acetaminophen level     Status: None   Collection Time: 09/11/15 10:20 PM  Result Value Ref Range   Acetaminophen (Tylenol), Serum 12 10 - 30 ug/mL    Comment:        THERAPEUTIC CONCENTRATIONS VARY SIGNIFICANTLY. A RANGE OF 10-30 ug/mL MAY BE AN EFFECTIVE CONCENTRATION FOR MANY PATIENTS. HOWEVER, SOME ARE BEST TREATED AT CONCENTRATIONS OUTSIDE THIS RANGE. ACETAMINOPHEN CONCENTRATIONS >150 ug/mL AT 4 HOURS AFTER INGESTION AND >50 ug/mL AT 12 HOURS AFTER INGESTION ARE OFTEN ASSOCIATED WITH TOXIC REACTIONS.   CBC     Status: Abnormal   Collection Time: 09/11/15 10:20 PM  Result Value Ref Range   WBC 11.4 (H) 4.0 - 10.5 K/uL   RBC 4.64 4.22 - 5.81 MIL/uL   Hemoglobin 13.5 13.0 - 17.0 g/dL   HCT 40.5 39.0 - 52.0 %   MCV 87.3 78.0 - 100.0 fL   MCH 29.1 26.0 - 34.0 pg   MCHC 33.3 30.0 - 36.0 g/dL   RDW 12.5 11.5 - 15.5 %   Platelets 170 150 - 400 K/uL  CBG monitoring, ED     Status: None   Collection Time: 09/11/15 10:21 PM  Result Value Ref Range   Glucose-Capillary 99 65 - 99 mg/dL  Urine rapid drug screen (hosp performed) (Not at [facility])     Status: None   Collection Time: 09/11/15 10:37 PM  Result Value Ref Range   Opiates NONE DETECTED NONE DETECTED   Cocaine NONE DETECTED NONE DETECTED   Benzodiazepines NONE DETECTED NONE DETECTED   Amphetamines NONE DETECTED NONE DETECTED   Tetrahydrocannabinol NONE DETECTED NONE DETECTED   Barbiturates NONE DETECTED NONE DETECTED    Comment:        DRUG SCREEN FOR MEDICAL PURPOSES ONLY.  IF CONFIRMATION IS NEEDED FOR ANY PURPOSE, NOTIFY LAB WITHIN 5 DAYS.        LOWEST DETECTABLE LIMITS FOR URINE DRUG SCREEN Drug Class       Cutoff (ng/mL) Amphetamine      1000 Barbiturate      200 Benzodiazepine   449 Tricyclics       201 Opiates          300 Cocaine  300 THC              50     Physical  Findings: AIMS: Facial and Oral Movements Muscles of Facial Expression: None, normal Lips and Perioral Area: None, normal Jaw: None, normal Tongue: None, normal,Extremity Movements Upper (arms, wrists, hands, fingers): None, normal Lower (legs, knees, ankles, toes): None, normal, Trunk Movements Neck, shoulders, hips: None, normal, Overall Severity Severity of abnormal movements (highest score from questions above): None, normal Incapacitation due to abnormal movements: None, normal Patient's awareness of abnormal movements (rate only patient's report): No Awareness, Dental Status Current problems with teeth and/or dentures?: No Does patient usually wear dentures?: No  CIWA:    COWS:      See Psychiatric Specialty Exam and Suicide Risk Assessment completed by Attending Physician prior to discharge.  Discharge destination:  Home  Is patient on multiple antipsychotic therapies at discharge:  Yes,   Do you recommend tapering to monotherapy for antipsychotics?  No   Has Patient had three or more failed trials of antipsychotic monotherapy by history:  No    Recommended Plan for Multiple Antipsychotic Therapies: NA  Discharge Instructions    Activity as tolerated - No restrictions    Complete by:  As directed      Diet general    Complete by:  As directed      Discharge instructions    Complete by:  As directed   Patient has been instructed to take medications as prescribed; and report adverse effects to outpatient provider.  Follow up with primary doctor for any medical issues and If symptoms recur report to nearest emergency or crisis hot line.            Medication List    TAKE these medications      Indication   traZODone 50 MG tablet  Commonly known as:  DESYREL  Take 1 tablet (50 mg total) by mouth at bedtime as needed for sleep.            Follow-up Information    Follow up with Patient declines referral for follow up..      Follow-up recommendations:   Activity:  as tolerated Diet:  heart healthy  Comments:  Take all of you medications as prescribed by your mental healthcare provider.  Report any adverse effects and reactions from your medications to your outpatient provider promptly. Do not engage in alcohol and or illegal drug use while on prescription medicines. In the event of worsening symptoms call the crisis hotline, 911, and or go to the nearest emergency department for appropriate evaluation and treatment of symptoms. Follow-up with your primary care provider for your medical issues, concerns and or health care needs.   Keep all scheduled appointments.  If you are unable to keep an appointment call to reschedule.  Let the nurse know if you will need medications before next scheduled appointment.  Total Discharge Time: 30 minutes   Signed: Derrill Center FNP-BC 09/14/2015, 10:36 AM   Patient seen, Suicide Assessment Completed.  Disposition Plan Reviewed

## 2015-09-14 NOTE — Progress Notes (Signed)
  [facility]  Will you be returning to the same living situation after discharge:  Yes,  patient plans to return home At discharge, do you have transportation home?: Yes,  patient reports access to transportation by family Do you have the ability to pay for your medications: Yes,  patient will be provided with prescriptions at discharge  Release of information consent forms completed and in the chart;  Patient's signature needed at discharge.  Patient to Follow up at: Follow-up Information    Follow up with Patient declines referral for follow up.Marland Kitchen.      Next level of care provider has [facility] Link:no  Patient denies SI/HI: Yes,  denies    Safety Planning and Suicide Prevention discussed: Yes,  with patient  Have you used any form of tobacco in the last 30 days? (Cigarettes, Smokeless Tobacco, Cigars, and/or Pipes): Yes  Has patient been referred to the Quitline?: Patient refused referral  Kevin Fitzgerald, [place] CarboKristin L 09/14/2015, 12:04 PM

## 2015-09-14 NOTE — [facility] Suicide Risk Assessment (Signed)
[facility] INPATIENT:  Family/Significant Other Suicide Prevention Education  Suicide Prevention Education:  Contact Attempts: wife Valentino NoseGail Desilets [phone] , (name of family member/significant other) has been identified by the patient as the family member/significant other with whom the patient will be residing, and identified as the person(s) who will aid the patient in the event of a mental health crisis.  With written consent from the patient, two attempts were made to provide suicide prevention education, prior to and/or following the patient's discharge.  We were unsuccessful in providing suicide prevention education.  A suicide education pamphlet was given to the patient to share with family/significant other.  Date and time of first attempt: 09/14/15 at 11:20am Date and time of second attempt: 09/14/15 at 12pm  Geraldene Eisel, Belenda CruiseKristin L 09/14/2015, 12:03 PM

## 2015-09-14 NOTE — [facility] Group Notes (Signed)

## 2015-09-14 NOTE — [facility] Suicide Risk Assessment (Signed)
[facility] Discharge Suicide Risk Assessment   Demographic Factors:  54 year old male, employed, lives with wife.   Total Time spent with patient: 30 minutes  Musculoskeletal: Strength & Muscle Tone: within normal limits Gait & Station: normal Patient leans: N/A  Psychiatric Specialty Exam: Physical Exam  ROS  Blood pressure 115/79, pulse 83, temperature 97.5 F (36.4 C), temperature source Oral, resp. rate 16, height  (1.753 m), weight 142 lb (64.411 kg), SpO2 100 %.Body mass index is 20.96 kg/(m^2).  General Appearance: Well Groomed  Patent attorney::  Good  Speech:  Normal Rate409  Volume:  Normal  Mood:  Euthymic- at this time denies depression  Affect:  Appropriate  Thought Process:  Goal Directed and Linear  Orientation:  Full (Time, Place, and Person)  Thought Content:  denies hallucinations, no delusions   Suicidal Thoughts:  No- denies any suicidal ideations or any self injurious ideations  Homicidal Thoughts:  No  Memory:  recent and remote grossly intact   Judgement:  Other:  improved   Insight:  improved   Psychomotor Activity:  Normal  Concentration:  Good  Recall:  Good  Fund of Knowledge:Good  Language: Good  Akathisia:  Negative  Handed:  Right  AIMS (if indicated):     Assets:  Communication Skills Desire for Improvement Resilience  Sleep:  Number of Hours: 6.75  Cognition: WNL  ADL's:  Intact   Have you used any form of tobacco in the last 30 days? (Cigarettes, Smokeless Tobacco, Cigars, and/or Pipes): Yes  Has this patient used any form of tobacco in the last 30 days? (Cigarettes, Smokeless Tobacco, Cigars, and/or Pipes) Yes, A prescription for an FDA-approved tobacco cessation medication was offered at discharge and the patient refused  Mental Status Per Nursing Assessment::   On Admission:  NA  Current Mental Status by Physician: At this time patient is improved compared to admission- mood improved, denies any depression at present, affect  appropriate, no thought disorder, no SI, no HI, no psychotic symptoms, future oriented, looking forward to returning home and reuniting with loved ones   Loss Factors:  financial difficulties, compounded by recent breakdown of his home's furnace, recent death of mother   Historical Factors:  no prior psychiatric admissions, no history of suicide attempts   Risk Reduction Factors:   Sense of responsibility to family, Employed, Living with another person, especially a relative and Positive coping skills or problem solving skills  Continued Clinical Symptoms:  As noted, currently much improved compared to admission  Cognitive Features That Contribute To Risk:  No gross cognitive deficits noted upon discharge. Is alert , attentive, and oriented x 3   Suicide Risk:  Mild:  Suicidal ideation of limited frequency, intensity, duration, and specificity.  There are no identifiable plans, no associated intent, mild dysphoria and related symptoms, good self-control (both objective and subjective assessment), few other risk factors, and identifiable protective factors, including available and accessible social support.  Principal Problem: Adjustment disorder with depressed mood Discharge Diagnoses:  Patient Active Problem List   Diagnosis Date Noted  . MDD (major depressive disorder) ([facility]) [F32.9] 09/12/2015  . Adjustment disorder with depressed mood [F43.21] 09/12/2015  . BPH (benign prostatic hyperplasia) [N40.0] 06/22/2013  . Dysuria [R30.0] 06/21/2013  . UTI (lower urinary tract infection) [N39.0] 06/21/2013  . BV (bacterial vaginosis) [N76.0, A49.9] 06/21/2013    Follow-up Information    Follow up with Patient declines referral for follow up..      Plan Of Care/Follow-up recommendations:  Activity:  as tolerated Diet:  heart healthy Tests:  NA Other:  See below   Is patient on multiple antipsychotic therapies at discharge:  No   Has Patient had three or more failed trials of  antipsychotic monotherapy by history:  No  Recommended Plan for Multiple Antipsychotic Therapies: NA  Patient is leaving unit in good spirits Plans to return home Of note, has declined referral for outpatient psychiatric, mental health services . States " I don't think I need it". Encouraged to follow up with an orthopedist for management for management of hand fracture   Vernia Teem 09/14/2015, 11:25 AM

## 2015-09-14 NOTE — Progress Notes (Signed)
   D: Pt informed the writer that he's scheduled for discharge tomorrow. When asked his plans he stated, "I'm going to my brother's house". States he wasn't informed of any follow up visits, but "plans to go".  Pt has no other questions or concerns.    A:  Support and encouragement was offered. 15 min checks continued for safety.  R: Pt remains safe.

## 2015-09-14 NOTE — Progress Notes (Signed)
Patient ID: Kevin SpellerHarry B Fitzgerald, male   DOB: 1960-12-10, 54 y.o.   MRN: [id number]   Pt discharged home with his wife. Pt was stable and appreciative at that time. All papers and prescriptions were given and valuables returned. Verbal understanding expressed. Denies SI/HI and A/VH. Pt given opportunity to express concerns and ask questions.

## 2015-09-14 NOTE — Progress Notes (Signed)
Patient ID: Kevin SpellerHarry B Fitzgerald, male   DOB: [birth year], 54 y.o.   MRN: [id number]   Pt currently presents with an anxious affect and behavior. Pt smiles during interaction with Clinical research associatewriter. Pt seen interacting with other pts in the milieu.   Pt provided with medications per providers orders. Pt's labs and vitals were monitored throughout the day. Pt supported emotionally and encouraged to express concerns and questions. Pt educated on medications.  Pt's safety ensured with 15 minute and environmental checks. Pt currently denies SI/HI and A/V hallucinations. Pt verbally agrees to seek staff if SI/HI or A/VH occurs and to consult with staff before acting on these thoughts. Pt to be discharged per MD orders. Will continue POC.

## 2015-12-04 ENCOUNTER — Other Ambulatory Visit (HOSPITAL_BASED_OUTPATIENT_CLINIC_OR_DEPARTMENT_OTHER): Payer: Self-pay | Admitting: Orthopaedic Surgery

## 2015-12-06 ENCOUNTER — Encounter (HOSPITAL_BASED_OUTPATIENT_CLINIC_OR_DEPARTMENT_OTHER): Payer: Self-pay | Admitting: *Deleted

## 2015-12-13 ENCOUNTER — Encounter (HOSPITAL_BASED_OUTPATIENT_CLINIC_OR_DEPARTMENT_OTHER)
Admission: RE | Disposition: A | Discharge status: Home / Self Care | Payer: Self-pay | Source: Ambulatory Visit | Attending: Orthopaedic Surgery

## 2015-12-13 ENCOUNTER — Encounter (HOSPITAL_BASED_OUTPATIENT_CLINIC_OR_DEPARTMENT_OTHER): Payer: Self-pay | Admitting: *Deleted

## 2015-12-13 ENCOUNTER — Ambulatory Visit (HOSPITAL_BASED_OUTPATIENT_CLINIC_OR_DEPARTMENT_OTHER)
Admission: RE | Admit: 2015-12-13 | Discharge: 2015-12-13 | Disposition: A | Discharge status: Home / Self Care | Payer: Managed Care, Other (non HMO) | Source: Ambulatory Visit | Attending: Orthopaedic Surgery | Admitting: Orthopaedic Surgery

## 2015-12-13 ENCOUNTER — Ambulatory Visit (HOSPITAL_BASED_OUTPATIENT_CLINIC_OR_DEPARTMENT_OTHER): Payer: Managed Care, Other (non HMO) | Admitting: Anesthesiology

## 2015-12-13 DIAGNOSIS — M179 Osteoarthritis of knee, unspecified: Secondary | ICD-10-CM | POA: Insufficient documentation

## 2015-12-13 DIAGNOSIS — I10 Essential (primary) hypertension: Secondary | ICD-10-CM | POA: Insufficient documentation

## 2015-12-13 DIAGNOSIS — M84441A Pathological fracture, right hand, initial encounter for fracture: Secondary | ICD-10-CM | POA: Diagnosis not present

## 2015-12-13 DIAGNOSIS — F17210 Nicotine dependence, cigarettes, uncomplicated: Secondary | ICD-10-CM | POA: Diagnosis not present

## 2015-12-13 DIAGNOSIS — S63054A Dislocation of other carpometacarpal joint of right hand, initial encounter: Secondary | ICD-10-CM | POA: Diagnosis present

## 2015-12-13 HISTORY — DX: Unspecified fracture of right hand, initial encounter for closed fracture: S62.91XA

## 2015-12-13 HISTORY — PX: FINGER ARTHRODESIS: SHX5000

## 2015-12-13 SURGERY — FUSION, JOINT, FINGER
Anesthesia: Regional | Site: Hand | Laterality: Right

## 2015-12-13 MED ORDER — PROPOFOL 500 MG/50ML IV EMUL
INTRAVENOUS | Status: DC | PRN
  Administered 2015-12-13: 200 ug/kg/min via INTRAVENOUS

## 2015-12-13 MED ORDER — PROPOFOL 10 MG/ML IV BOLUS
INTRAVENOUS | Status: AC
  Filled 2015-12-13: qty 40

## 2015-12-13 MED ORDER — OXYCODONE HCL ER 10 MG PO T12A: 10.0000 mg | EXTENDED_RELEASE_TABLET | Freq: Two times a day (BID) | ORAL | Status: DC

## 2015-12-13 MED ORDER — LIDOCAINE HCL (CARDIAC) 20 MG/ML IV SOLN
INTRAVENOUS | Status: DC | PRN
  Administered 2015-12-13: 50 mg via INTRAVENOUS

## 2015-12-13 MED ORDER — SCOPOLAMINE 1 MG/3DAYS TD PT72: 1.0000 | MEDICATED_PATCH | Freq: Once | TRANSDERMAL | Status: DC | PRN

## 2015-12-13 MED ORDER — MEPERIDINE HCL 25 MG/ML IJ SOLN: 6.2500 mg | INTRAMUSCULAR | Status: DC | PRN

## 2015-12-13 MED ORDER — MIDAZOLAM HCL 2 MG/2ML IJ SOLN
INTRAMUSCULAR | Status: AC
  Filled 2015-12-13: qty 2

## 2015-12-13 MED ORDER — EPHEDRINE SULFATE 50 MG/ML IJ SOLN
INTRAMUSCULAR | Status: AC
  Filled 2015-12-13: qty 1

## 2015-12-13 MED ORDER — LIDOCAINE HCL (CARDIAC) 20 MG/ML IV SOLN
INTRAVENOUS | Status: AC
  Filled 2015-12-13: qty 5

## 2015-12-13 MED ORDER — MIDAZOLAM HCL 2 MG/2ML IJ SOLN
1.0000 mg | INTRAMUSCULAR | Status: DC | PRN
  Administered 2015-12-13: 2 mg via INTRAVENOUS

## 2015-12-13 MED ORDER — ONDANSETRON HCL 4 MG/2ML IJ SOLN
INTRAMUSCULAR | Status: AC
  Filled 2015-12-13: qty 2

## 2015-12-13 MED ORDER — OXYCODONE-ACETAMINOPHEN 5-325 MG PO TABS: 1.0000 | ORAL_TABLET | ORAL | Status: DC | PRN

## 2015-12-13 MED ORDER — FENTANYL CITRATE (PF) 100 MCG/2ML IJ SOLN
INTRAMUSCULAR | Status: AC
  Filled 2015-12-13: qty 2

## 2015-12-13 MED ORDER — LACTATED RINGERS IV SOLN
INTRAVENOUS | Status: DC
  Administered 2015-12-13 (×2): via INTRAVENOUS

## 2015-12-13 MED ORDER — SENNOSIDES-DOCUSATE SODIUM 8.6-50 MG PO TABS
1.0000 | ORAL_TABLET | Freq: Every evening | ORAL | Status: DC | PRN
Start: 2015-12-13 — End: 2017-05-16

## 2015-12-13 MED ORDER — CEFAZOLIN SODIUM-DEXTROSE 2-3 GM-% IV SOLR
2.0000 g | INTRAVENOUS | Status: AC
  Administered 2015-12-13: 2 g via INTRAVENOUS

## 2015-12-13 MED ORDER — ONDANSETRON HCL 4 MG/2ML IJ SOLN
INTRAMUSCULAR | Status: DC | PRN
  Administered 2015-12-13: 4 mg via INTRAVENOUS

## 2015-12-13 MED ORDER — HYDROMORPHONE HCL 1 MG/ML IJ SOLN: 0.2500 mg | INTRAMUSCULAR | Status: DC | PRN

## 2015-12-13 MED ORDER — ONDANSETRON HCL 4 MG/2ML IJ SOLN: 4.0000 mg | Freq: Once | INTRAMUSCULAR | Status: DC | PRN

## 2015-12-13 MED ORDER — CEFAZOLIN SODIUM-DEXTROSE 2-3 GM-% IV SOLR
INTRAVENOUS | Status: AC
  Filled 2015-12-13: qty 50

## 2015-12-13 MED ORDER — FENTANYL CITRATE (PF) 100 MCG/2ML IJ SOLN
50.0000 ug | INTRAMUSCULAR | Status: AC | PRN
  Administered 2015-12-13 (×2): 50 ug via INTRAVENOUS
  Administered 2015-12-13: 100 ug via INTRAVENOUS

## 2015-12-13 MED ORDER — GLYCOPYRROLATE 0.2 MG/ML IJ SOLN: 0.2000 mg | Freq: Once | INTRAMUSCULAR | Status: DC | PRN

## 2015-12-13 MED ORDER — PROPOFOL 500 MG/50ML IV EMUL
INTRAVENOUS | Status: AC
  Filled 2015-12-13: qty 50

## 2015-12-13 SURGICAL SUPPLY — 91 items
BANDAGE ACE 3X5.8 VEL STRL LF (GAUZE/BANDAGES/DRESSINGS) ×4 IMPLANT
BIT DRILL 1.1X3.5 QK RELEA (DRILL) ×2 IMPLANT
BIT DRILL 1.1X3.5 QUICK RELEAS (DRILL) ×8
BIT DRILL 2X3.5 HAND QK RELEAS (BIT) ×1 IMPLANT
BIT DRILL 2X3.5 QUICK RELEASE (BIT) ×4
BLADE MINI RND TIP GREEN BEAV (BLADE) ×4 IMPLANT
BLADE SURG 15 STRL LF DISP TIS (BLADE) ×4 IMPLANT
BLADE SURG 15 STRL SS (BLADE) ×8
BNDG CMPR 9X4 STRL LF SNTH (GAUZE/BANDAGES/DRESSINGS) ×2
BNDG ESMARK 4X9 LF (GAUZE/BANDAGES/DRESSINGS) ×4 IMPLANT
BNDG PLASTER X FAST 3X3 WHT LF (CAST SUPPLIES) IMPLANT
BNDG PLSTR 9X3 FST ST WHT (CAST SUPPLIES)
BRUSH SCRUB EZ PLAIN DRY (MISCELLANEOUS) ×4 IMPLANT
BUR EGG/OVAL CARBIDE (BURR) ×3 IMPLANT
CORDS BIPOLAR (ELECTRODE) ×4 IMPLANT
COVER BACK TABLE 60X90IN (DRAPES) ×4 IMPLANT
COVER MAYO STAND STRL (DRAPES) ×4 IMPLANT
CUFF TOURNIQUET SINGLE 18IN (TOURNIQUET CUFF) IMPLANT
DECANTER SPIKE VIAL GLASS SM (MISCELLANEOUS) IMPLANT
DRAPE EXTREMITY T 121X128X90 (DRAPE) ×4 IMPLANT
DRAPE OEC MINIVIEW 54X84 (DRAPES) ×4 IMPLANT
DRAPE SURG 17X23 STRL (DRAPES) ×4 IMPLANT
DRAPE U 20/CS (DRAPES) ×4 IMPLANT
GAUZE SPONGE 4X4 12PLY STRL (GAUZE/BANDAGES/DRESSINGS) ×4 IMPLANT
GAUZE SPONGE 4X4 16PLY XRAY LF (GAUZE/BANDAGES/DRESSINGS) IMPLANT
GAUZE XEROFORM 1X8 LF (GAUZE/BANDAGES/DRESSINGS) ×4 IMPLANT
GLOVE BIOGEL M STRL SZ7.5 (GLOVE) ×3 IMPLANT
GLOVE BIOGEL PI IND STRL 8 (GLOVE) ×1 IMPLANT
GLOVE BIOGEL PI INDICATOR 8 (GLOVE) ×2
GLOVE SURG SYN 7.5  E (GLOVE) ×4
GLOVE SURG SYN 7.5 E (GLOVE) ×4 IMPLANT
GLOVE SURG SYN 7.5 PF PI (GLOVE) ×2 IMPLANT
GOWN PREVENTION PLUS XLARGE (GOWN DISPOSABLE) ×1 IMPLANT
GOWN STRL REIN XL XLG (GOWN DISPOSABLE) ×7 IMPLANT
GOWN STRL REUS W/ TWL LRG LVL3 (GOWN DISPOSABLE) ×2 IMPLANT
GOWN STRL REUS W/TWL LRG LVL3 (GOWN DISPOSABLE) ×4
NDL HYPO 25X1 1.5 SAFETY (NEEDLE) IMPLANT
NEEDLE HYPO 25X1 1.5 SAFETY (NEEDLE) IMPLANT
NS IRRIG 1000ML POUR BTL (IV SOLUTION) ×4 IMPLANT
PACK BASIN DAY SURGERY FS (CUSTOM PROCEDURE TRAY) ×4 IMPLANT
PAD CAST 3X4 CTTN HI CHSV (CAST SUPPLIES) ×2 IMPLANT
PADDING CAST ABS 3INX4YD NS (CAST SUPPLIES)
PADDING CAST ABS 4INX4YD NS (CAST SUPPLIES) ×2
PADDING CAST ABS COTTON 3X4 (CAST SUPPLIES) IMPLANT
PADDING CAST ABS COTTON 4X4 ST (CAST SUPPLIES) ×2 IMPLANT
PADDING CAST COTTON 3X4 STRL (CAST SUPPLIES) ×4
PLATE 1.3 T-SHAPED (Plate) ×3 IMPLANT
PLATE COMPRESSION 6H 1.3 (Plate) ×2 IMPLANT
PUTTY DBM STAGRAFT 5CC (Putty) ×3 IMPLANT
SCREW BONE LAG 1.5X10MM HEXA (Screw) ×1 IMPLANT
SCREW BONE LAG 1.5X11MM HEXA (Screw) ×2 IMPLANT
SCREW BONE LAG 2.3X12MM HEXA (Screw) ×1 IMPLANT
SCREW BONE LAG 2.3X16MM HEXA (Screw) ×1 IMPLANT
SCREW BONE MD 1.5X14MM HEXA (Screw) ×1 IMPLANT
SCREW BONE NON-LOCK 1.5X12 (Screw) ×3 IMPLANT
SCREW BONE NON-LOCK 1.5X14 (Screw) ×4 IMPLANT
SCREW BONE NON-LOCK 1.5X16 (Screw) ×6 IMPLANT
SCREW LAG 2.3X12MM (Screw) ×4 IMPLANT
SCREW LAG 2.3X16 (Screw) ×4 IMPLANT
SCREW LAG HEXALOBE 1.5X10 (Screw) ×4 IMPLANT
SCREW LAG HEXALOBE 1.5X11 (Screw) ×8 IMPLANT
SCREW LOCK HEX MULTI 2.3X16 (Screw) ×3 IMPLANT
SCREW MD 1.5X14MM (Screw) ×4 IMPLANT
SCREW MD 1.5X16MM (Screw) ×3 IMPLANT
SLEEVE SCD COMPRESS KNEE MED (MISCELLANEOUS) ×4 IMPLANT
SPLINT FIBERGLASS 3X35 (CAST SUPPLIES) ×3 IMPLANT
STOCKINETTE 4X48 STRL (DRAPES) ×4 IMPLANT
SUCTION FRAZIER HANDLE 10FR (MISCELLANEOUS) ×2
SUCTION TUBE FRAZIER 10FR DISP (MISCELLANEOUS) ×2 IMPLANT
SUT 2 FIBERLOOP 20 STRT BLUE (SUTURE)
SUT ETHIBOND 0 MO6 C/R (SUTURE) ×4 IMPLANT
SUT ETHILON 3 0 PS 1 (SUTURE) ×4 IMPLANT
SUT FIBERWIRE #2 38 T-5 BLUE (SUTURE)
SUT FIBERWIRE 2-0 18 17.9 3/8 (SUTURE)
SUT MNCRL AB 4-0 PS2 18 (SUTURE) ×4 IMPLANT
SUT VIC AB 0 SH 27 (SUTURE) IMPLANT
SUT VIC AB 2-0 CT1 27 (SUTURE) ×4
SUT VIC AB 2-0 CT1 TAPERPNT 27 (SUTURE) ×2 IMPLANT
SUT VIC AB 2-0 SH 27 (SUTURE) ×4
SUT VIC AB 2-0 SH 27XBRD (SUTURE) ×2 IMPLANT
SUTURE 2 FIBERLOOP 20 STRT BLU (SUTURE) IMPLANT
SUTURE FIBERWR #2 38 T-5 BLUE (SUTURE) IMPLANT
SUTURE FIBERWR 2-0 18 17.9 3/8 (SUTURE) IMPLANT
SYR BULB 3OZ (MISCELLANEOUS) ×4 IMPLANT
SYR CONTROL 10ML LL (SYRINGE) IMPLANT
TOWEL OR 17X24 6PK STRL BLUE (TOWEL DISPOSABLE) ×8 IMPLANT
TOWEL OR NON WOVEN STRL DISP B (DISPOSABLE) ×4 IMPLANT
TRAY DSU PREP LF (CUSTOM PROCEDURE TRAY) ×4 IMPLANT
TUBE CONNECTING 20'X1/4 (TUBING) ×1
TUBE CONNECTING 20X1/4 (TUBING) ×3 IMPLANT
UNDERPAD 30X30 (UNDERPADS AND DIAPERS) ×4 IMPLANT

## 2015-12-13 NOTE — Anesthesia Preprocedure Evaluation (Addendum)
Anesthesia Evaluation  Patient identified by MRN, date of birth, ID band Patient awake    Reviewed: H&P , NPO status , Patient's Chart, lab work & pertinent test results  Airway Mallampati: I  TM Distance: >3 FB Neck ROM: full    Dental  (+) Teeth Intact, Dental Advisory Given   Pulmonary Current Smoker,  childhood hx pt states "No problem any more"   Pulmonary exam normal        Cardiovascular Exercise Tolerance: Good hypertension, Normal cardiovascular exam Rhythm:Regular Rate:Normal     Neuro/Psych Anxiety Depression    GI/Hepatic   Endo/Other    Renal/GU      Musculoskeletal  (+) Arthritis , Osteoarthritis,    Abdominal   Peds  Hematology   Anesthesia Other Findings   Reproductive/Obstetrics                           Anesthesia Physical Anesthesia Plan  ASA: II  Anesthesia Plan: Regional   Post-op Pain Management:    Induction:   Airway Management Planned:   Additional Equipment:   Intra-op Plan:   Post-operative Plan:   Informed Consent: I have reviewed the patients History and Physical, chart, labs and discussed the procedure including the risks, benefits and alternatives for the proposed anesthesia with the patient or authorized representative who has indicated his/her understanding and acceptance.     Plan Discussed with: Anesthesiologist and Surgeon  Anesthesia Plan Comments:         Anesthesia Quick Evaluation

## 2015-12-13 NOTE — Transfer of Care (Signed)
Immediate Anesthesia Transfer of Care Note  Patient: Kevin Fitzgerald  Procedure(s) Performed: Procedure(s): RIGHT 4TH AND 5TH CARPOMETACARPAL ARTHRODESIS (Right)  Patient Location: PACU  Anesthesia Type:MAC  Level of Consciousness: awake, alert , oriented and patient cooperative  Airway & Oxygen Therapy: Patient Spontanous Breathing and Patient connected to nasal cannula oxygen  Post-op Assessment: Report given to RN and Post -op Vital signs reviewed and stable  Post vital signs: Reviewed and stable  Last Vitals:  Filed Vitals:   12/13/15 0824 12/13/15 0825  BP:    Pulse: 65 66  Temp:    Resp: 11 13    Complications: No apparent anesthesia complications

## 2015-12-13 NOTE — H&P (Signed)
    PREOPERATIVE H&P  Chief Complaint: right 4th and 5th carpometacarpal dislocation  HPI: Kevin Fitzgerald is a 55 y.o. male who presents for surgical treatment of right 4th and 5th carpometacarpal dislocation.  He denies any changes in medical history.  Past Medical History  Diagnosis Date  . Asthma     as child  . Arthritis     knee  . Hypertension     no meds  . Hand fracture, right    Past Surgical History  Procedure Laterality Date  . Transurethral resection of prostate N/A 09/22/2013    Procedure: TRANSURETHRAL RESECTION OF THE PROSTATE WITH GYRUS INSTRUMENTS;  Surgeon: Sebastian Ache, MD;  Location: [facility];  Service: Urology;  Laterality: N/A;   Social History   Social History  . Marital Status: Married    Spouse Name: N/A  . Number of Children: N/A  . Years of Education: N/A   Social History Main Topics  . Smoking status: Current Every Day Smoker -- 1.00 packs/day for 30 years    Types: Cigarettes  . Smokeless tobacco: None  . Alcohol Use: Yes     Comment: occassionally, 2-3 times per month  . Drug Use: No  . Sexual Activity: Yes   Other Topics Concern  . None   Social History Narrative   Family History  Problem Relation Age of Onset  . Cancer Mother   . Heart attack Father   . Diabetes Brother    No Known Allergies Prior to Admission medications   Medication Sig Start Date End Date Taking? Authorizing Provider  HYDROcodone-acetaminophen (NORCO/VICODIN) 5-325 MG tablet Take 1 tablet by mouth every 6 (six) hours as needed for moderate pain.   Yes Historical Provider, MD     Positive ROS: All other systems have been reviewed and were otherwise negative with the exception of those mentioned in the HPI and as above.  Physical Exam: General: Alert, no acute distress Cardiovascular: No pedal edema Respiratory: No cyanosis, no use of accessory musculature GI: abdomen soft Skin: No lesions in the area of chief complaint Neurologic: Sensation intact  distally Psychiatric: Patient is competent for consent with normal mood and affect Lymphatic: no lymphedema  MUSCULOSKELETAL: exam stable  Assessment: right 4th and 5th carpometacarpal dislocation  Plan: Plan for Procedure(s): RIGHT 4TH AND 5TH CARPOMETACARPAL ARTHRODESIS  The risks benefits and alternatives were discussed with the patient including but not limited to the risks of nonoperative treatment, versus surgical intervention including infection, bleeding, nerve injury,  blood clots, cardiopulmonary complications, morbidity, mortality, among others, and they were willing to proceed.   Cheral Almas, MD   12/13/2015 7:06 AM

## 2015-12-13 NOTE — Progress Notes (Signed)
Assisted Dr. Ossey with right, ultrasound guided, supraclavicular block. Side rails up, monitors on throughout procedure. See vital signs in flow sheet. Tolerated Procedure well. 

## 2015-12-13 NOTE — Op Note (Signed)
   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  Anesthesia: Regional and conscious sedation  Estimated blood loss: Minimal  Drains: None  Implants: Acumed  Complications: None  Condition to PACU: Stable  Indications for procedure: This Chew is a 55 year old gentleman who presents today for surgical treatment of the above-mentioned conditions after failing extensive conservative treatment. He was aware of the risks, benefits, and alternatives to surgery he wished to proceed. Consent was signed.  Description of procedure: Patient was brought back to the operating room. Regional anesthesia had been administered in the preoperative holding area by the anesthesiologist. After he was under appropriate conscious sedation a nonsterile tourniquet was placed on the upper right arm. The operative extremity was prepped and draped in standard sterile fashion. Preoperative antibiotics were given. Timeout was performed. It was exsanguinated and tourniquet was inflated to 250 mmHg. A dorsal incision between the fourth and fifth metacarpals was utilized. The veins were retracted and some were cauterized. The distal edge of the extensor retinaculum was incised and later repaired. The chronic fracture dislocation was then identified.  There was a large amount of fracture callus and healing. This was taken down. The base of the fourth and fifth metacarpals were both exposed. Periosteum was sharply elevated using a knife. Fracture callus was removed from the [facility] joint. Once we had appropriate debridement of the joint area and we used a high-speed bur to denude the cartilage. We then reduced the fourth metacarpal to the hamate. A plate was placed dorsally in order to hold  the reduction. We then reduced the fifth metacarpal to the hamate in a similar fashion and used a dorsal plate. Bone graft was then placed in the void. Final x-rays were taken. Clinically the hand had a normal cascade. The wound was thoroughly irrigated. Extensor retinaculum was closed with 2-0 Vicryl. Skin was closed with 3-0 nylon. Patient tolerated the procedure well had no immediate complications.  Postoperative plan: Patient will be nonweightbearing to the right upper extremity. He will remain in the splint until follow-up.  Mayra Reel, MD [facility] [phone] 10:57 AM

## 2015-12-13 NOTE — Anesthesia Postprocedure Evaluation (Signed)
Anesthesia Post Note  Patient: Kevin Fitzgerald  Procedure(s) Performed: Procedure(s) (LRB): RIGHT 4TH AND 5TH CARPOMETACARPAL ARTHRODESIS (Right)  Patient location during evaluation: PACU Anesthesia Type: Regional Level of consciousness: oriented and awake and alert Pain management: pain level controlled Vital Signs Assessment: post-procedure vital signs reviewed and stable Respiratory status: spontaneous breathing, respiratory function stable and patient connected to nasal cannula oxygen Cardiovascular status: blood pressure returned to baseline and stable Postop Assessment: no headache and no backache Anesthetic complications: no    Last Vitals:  Filed Vitals:   12/13/15 1052 12/13/15 1100  BP: 115/82 128/80  Pulse: 63 63  Temp: 36.5 C   Resp: 18 13    Last Pain:  Filed Vitals:   12/13/15 1107  PainSc: 0-No pain                 Zyanya Glaza DAVID

## 2015-12-13 NOTE — Discharge Instructions (Signed)
Postoperative instructions: ° °Weightbearing: non weight bearing ° °Dressing instructions: Keep your dressing and/or splint clean and dry at all times.  It will be removed at your first post-operative appointment.  Your stitches and/or staples will be removed at this visit. ° °Incision instructions:  Do not soak your incision for 3 weeks after surgery.  If the incision gets wet, pat dry and do not scrub the incision. ° °Pain control:  You have been given a prescription to be taken as directed for post-operative pain control.  In addition, elevate the operative extremity above the heart at all times to prevent swelling and throbbing pain. ° °Take over-the-counter Colace, 100mg by mouth twice a day while taking narcotic pain medications to help prevent constipation. ° °Follow up appointments: °1) 10-14 days for suture removal and wound check. °2) Dr. Xu as scheduled. ° ° ------------------------------------------------------------------------------------------------------------- ° °After Surgery Pain Control: ° °After your surgery, post-surgical discomfort or pain is likely. This discomfort can last several days to a few weeks. At certain times of the day your discomfort may be more intense.  °Did you receive a nerve block?  °A nerve block can provide pain relief for one hour to two days after your surgery. As long as the nerve block is working, you will experience little or no sensation in the area the surgeon operated on.  °As the nerve block wears off, you will begin to experience pain or discomfort. It is very important that you begin taking your prescribed pain medication before the nerve block fully wears off. Treating your pain at the first sign of the block wearing off will ensure your pain is better controlled and more tolerable when full-sensation returns. Do not wait until the pain is intolerable, as the medicine will be less effective. It is better to treat pain in advance than to try and catch up.    °General Anesthesia:  °If you did not receive a nerve block during your surgery, you will need to start taking your pain medication shortly after your surgery and should continue to do so as prescribed by your surgeon.  °Pain Medication:  °Most commonly we prescribe Vicodin and Percocet for post-operative pain. Both of these medications contain a combination of acetaminophen (Tylenol®) and a narcotic to help control pain.  °· It takes between 30 and 45 minutes before pain medication starts to work. It is important to take your medication before your pain level gets too intense.  °· Nausea is a common side effect of many pain medications. You will want to eat something before taking your pain medicine to help prevent nausea.  °· If you are taking a prescription pain medication that contains acetaminophen, we recommend that you do not take additional over the counter acetaminophen (Tylenol®).  °Other pain relieving options:  °· Using a cold pack to ice the affected area a few times a day (15 to 20 minutes at a time) can help to relieve pain, reduce swelling and bruising.  °· Elevation of the affected area can also help to reduce pain and swelling. ° ° ° ° °Post Anesthesia Home Care Instructions ° °Activity: °Get plenty of rest for the remainder of the day. A responsible adult should stay with you for 24 hours following the procedure.  °For the next 24 hours, DO NOT: °-Drive a car °-Operate machinery °-Drink alcoholic beverages °-Take any medication unless instructed by your physician °-Make any legal decisions or sign important papers. ° °Meals: °Start with liquid foods such as gelatin   or soup. Progress to regular foods as tolerated. Avoid greasy, spicy, heavy foods. If nausea and/or vomiting occur, drink only clear liquids until the nausea and/or vomiting subsides. Call your physician if vomiting continues. ° °Special Instructions/Symptoms: °Your throat may feel dry or sore from the anesthesia or the breathing tube  placed in your throat during surgery. If this causes discomfort, gargle with warm salt water. The discomfort should disappear within 24 hours. ° °If you had a scopolamine patch placed behind your ear for the management of post- operative nausea and/or vomiting: ° °1. The medication in the patch is effective for 72 hours, after which it should be removed.  Wrap patch in a tissue and discard in the trash. Wash hands thoroughly with soap and water. °2. You may remove the patch earlier than 72 hours if you experience unpleasant side effects which may include dry mouth, dizziness or visual disturbances. °3. Avoid touching the patch. Wash your hands with soap and water after contact with the patch. °  °Regional Anesthesia Blocks ° °1. Numbness or the inability to move the "blocked" extremity may last from 3-48 hours after placement. The length of time depends on the medication injected and your individual response to the medication. If the numbness is not going away after 48 hours, call your surgeon. ° °2. The extremity that is blocked will need to be protected until the numbness is gone and the  Strength has returned. Because you cannot feel it, you will need to take extra care to avoid injury. Because it may be weak, you may have difficulty moving it or using it. You may not know what position it is in without looking at it while the block is in effect. ° °3. For blocks in the legs and feet, returning to weight bearing and walking needs to be done carefully. You will need to wait until the numbness is entirely gone and the strength has returned. You should be able to move your leg and foot normally before you try and bear weight or walk. You will need someone to be with you when you first try to ensure you do not fall and possibly risk injury. ° °4. Bruising and tenderness at the needle site are common side effects and will resolve in a few days. ° °5. Persistent numbness or new problems with movement should be  communicated to the surgeon or the [facility] ([phone])/ [facility] ([phone]). ° ° °

## 2015-12-13 NOTE — Anesthesia Procedure Notes (Addendum)
Procedure Name: MAC Date/Time: 12/13/2015 8:35 AM Performed by: Tyrone Nine Pre-anesthesia Checklist: Patient identified, Timeout performed, Emergency Drugs available, Suction available and Patient being monitored Patient Re-evaluated:Patient Re-evaluated prior to inductionOxygen Delivery Method: Nasal cannula Intubation Type: IV induction Placement Confirmation: breath sounds checked- equal and bilateral and positive ETCO2 Dental Injury: Teeth and Oropharynx as per pre-operative assessment    Anesthesia Regional Block:  Supraclavicular block  Pre-Anesthetic Checklist: ,, timeout performed, Correct Patient, Correct Site, Correct Laterality, Correct Procedure, Correct Position, site marked, Risks and benefits discussed,  Surgical consent,  Pre-op evaluation,  At surgeon's request and post-op pain management  Laterality: Right  Prep: chloraprep       Needles:   Needle Type: Echogenic Stimulator Needle     Needle Length: 9cm 9 cm Needle Gauge: 21 and 21 G    Additional Needles:  Procedures: ultrasound guided (picture in chart) and nerve stimulator Supraclavicular block  Nerve Stimulator or Paresthesia:  Response: 0.4 mA,   Additional Responses:   Narrative:  Start time: 12/13/2015 8:10 AM End time: 12/13/2015 8:20 AM Injection made incrementally with aspirations every 5 mL.  Performed by: Personally  Anesthesiologist: Arta Bruce  Additional Notes: Monitors applied. Patient sedated. Sterile prep and drape,hand hygiene and sterile gloves were used. Relevant anatomy identified.Needle position confirmed.Local anesthetic injected incrementally after negative aspiration. Local anesthetic spread visualized around nerve(s). Vascular puncture avoided. No complications. Image printed for medical record.The patient tolerated the procedure well.

## 2015-12-14 ENCOUNTER — Encounter (HOSPITAL_BASED_OUTPATIENT_CLINIC_OR_DEPARTMENT_OTHER): Payer: Self-pay | Admitting: Orthopaedic Surgery

## 2016-09-17 ENCOUNTER — Encounter (INDEPENDENT_AMBULATORY_CARE_PROVIDER_SITE_OTHER): Payer: Self-pay | Admitting: Orthopaedic Surgery

## 2016-09-17 ENCOUNTER — Ambulatory Visit (INDEPENDENT_AMBULATORY_CARE_PROVIDER_SITE_OTHER): Payer: 59

## 2016-09-17 ENCOUNTER — Ambulatory Visit (INDEPENDENT_AMBULATORY_CARE_PROVIDER_SITE_OTHER): Payer: 59 | Admitting: Orthopaedic Surgery

## 2016-09-17 DIAGNOSIS — M25562 Pain in left knee: Secondary | ICD-10-CM

## 2016-09-17 DIAGNOSIS — G8929 Other chronic pain: Secondary | ICD-10-CM

## 2016-09-17 MED ORDER — LIDOCAINE HCL 1 % IJ SOLN
2.0000 mL | INTRAMUSCULAR | Status: AC | PRN
  Administered 2016-09-17: 2 mL

## 2016-09-17 MED ORDER — METHYLPREDNISOLONE ACETATE 40 MG/ML IJ SUSP
40.0000 mg | INTRAMUSCULAR | Status: AC | PRN
  Administered 2016-09-17: 40 mg via INTRA_ARTICULAR

## 2016-09-17 MED ORDER — MELOXICAM 7.5 MG PO TABS: 7.5000 mg | ORAL_TABLET | Freq: Two times a day (BID) | ORAL | 2 refills | Status: DC | PRN

## 2016-09-17 MED ORDER — BUPIVACAINE HCL 0.5 % IJ SOLN
2.0000 mL | INTRAMUSCULAR | Status: AC | PRN
  Administered 2016-09-17: 2 mL via INTRA_ARTICULAR

## 2016-09-17 NOTE — Patient Instructions (Signed)

## 2016-09-17 NOTE — Progress Notes (Signed)
Office Visit Note   Patient: Kevin Fitzgerald           Date of Birth: [birth year]           MRN: [id number] Visit Date: 09/17/2016              Requested by: No referring provider defined for this encounter. PCP: No PCP Per Patient   Assessment & Plan: Visit Diagnoses:  1. Chronic pain of left knee     Plan:  - knee compression sleeve - mobic - knee inj today - HEP - f/u prn  Follow-Up Instructions: Return if symptoms worsen or fail to improve.   Orders:  Orders Placed This Encounter  Procedures  . Large Joint Injection/Arthrocentesis  . XR KNEE 3 VIEW LEFT   Meds ordered this encounter  Medications  . meloxicam (MOBIC) 7.5 MG tablet    Sig: Take 1 tablet (7.5 mg total) by mouth 2 (two) times daily as needed for pain.    Dispense:  30 tablet    Refill:  2      Procedures: Large Joint Inj Date/Time: 09/17/2016 12:13 PM Performed by: Tarry KosXU, Afrika Brick M Authorized by: Tarry KosXU, Haiden Clucas M   Consent Given by:  Patient Timeout: prior to procedure the correct patient, procedure, and site was verified   Indications:  Pain Location:  Knee Site:  R knee Prep: patient was prepped and draped in usual sterile fashion   Needle Size:  22 G Approach:  Anterolateral Ultrasound Guidance: No   Fluoroscopic Guidance: No   Arthrogram: No   Medications:  2 mL lidocaine 1 %; 2 mL bupivacaine 0.5 %; 40 mg methylPREDNISolone acetate 40 MG/ML     Clinical Data: No additional findings.   Subjective: Chief Complaint  Patient presents with  . Left Knee - Pain    55 yo male known to me comes in for left knee pain for 4 years.  Throbbing pain that's mainly medial knee and worse in cold weather and standing.  Denies mech sxs.  Denies previous surgery.      Review of Systems  Constitutional: Negative.   HENT: Negative.   Eyes: Negative.   Respiratory: Negative.   Cardiovascular: Negative.   Gastrointestinal: Negative.   Endocrine: Negative.   Genitourinary: Negative.     Musculoskeletal: Negative.   Skin: Negative.   Allergic/Immunologic: Negative.   Neurological: Negative.   Hematological: Negative.   Psychiatric/Behavioral: Negative.      Objective: Vital Signs: There were no vitals taken for this visit.  Physical Exam  Constitutional: He is oriented to person, place, and time. He appears well-developed and well-nourished.  HENT:  Head: Normocephalic and atraumatic.  Eyes: EOM are normal.  Neck: Neck supple.  Cardiovascular: Intact distal pulses.   Pulmonary/Chest: Effort normal.  Abdominal: Soft.  Musculoskeletal:       Left knee: He exhibits no effusion.  Neurological: He is alert and oriented to person, place, and time.  Skin: Skin is warm.  Psychiatric: He has a normal mood and affect. His behavior is normal. Judgment and thought content normal.  Nursing note and vitals reviewed.   Left Knee Exam   Tenderness  The patient is experiencing no tenderness.     Range of Motion  The patient has normal left knee ROM.  Tests  McMurray:  Medial - negative Lateral - negative  Other  Sensation: normal Pulse: present Swelling: none Effusion: no effusion present      Specialty Comments:  No specialty comments  available.  Imaging: Xr Knee 3 View Left  Result Date: 09/17/2016 Mild OA changes.      PMFS History: Patient Active Problem List   Diagnosis Date Noted  . Adjustment disorder with mixed anxiety and depressed mood   . MDD (major depressive disorder) 09/12/2015  . Adjustment disorder with depressed mood 09/12/2015  . BPH (benign prostatic hyperplasia) 06/22/2013  . Dysuria 06/21/2013  . UTI (lower urinary tract infection) 06/21/2013  . BV (bacterial vaginosis) 06/21/2013   Past Medical History:  Diagnosis Date  . Arthritis    knee  . Asthma    as child  . Hand fracture, right   . Hypertension    no meds    Family History  Problem Relation Age of Onset  . Cancer Mother   . Heart attack Father   .  Diabetes Brother     Past Surgical History:  Procedure Laterality Date  . FINGER ARTHRODESIS Right 12/13/2015   Procedure: RIGHT 4TH AND 5TH CARPOMETACARPAL ARTHRODESIS;  Surgeon: Tarry KosNaiping M Raymondo Garcialopez, MD;  Location: [facility];  Service: Orthopedics;  Laterality: Right;  . TRANSURETHRAL RESECTION OF PROSTATE N/A 09/22/2013   Procedure: TRANSURETHRAL RESECTION OF THE PROSTATE WITH GYRUS INSTRUMENTS;  Surgeon: Sebastian Acheheodore Manny, MD;  Location: [facility];  Service: Urology;  Laterality: N/A;   Social History   Occupational History  . Not on file.   Social History Main Topics  . Smoking status: Current Every Day Smoker    Packs/day: 1.00    Years: 30.00    Types: Cigarettes  . Smokeless tobacco: Not on file  . Alcohol use Yes     Comment: occassionally, 2-3 times per month  . Drug use: No  . Sexual activity: Yes

## 2016-12-03 ENCOUNTER — Encounter (INDEPENDENT_AMBULATORY_CARE_PROVIDER_SITE_OTHER): Payer: Self-pay | Admitting: Orthopaedic Surgery

## 2016-12-03 ENCOUNTER — Ambulatory Visit (INDEPENDENT_AMBULATORY_CARE_PROVIDER_SITE_OTHER): Payer: 59 | Admitting: Orthopaedic Surgery

## 2016-12-03 DIAGNOSIS — M25562 Pain in left knee: Secondary | ICD-10-CM | POA: Diagnosis not present

## 2016-12-03 DIAGNOSIS — G8929 Other chronic pain: Secondary | ICD-10-CM | POA: Insufficient documentation

## 2016-12-03 MED ORDER — MELOXICAM 7.5 MG PO TABS: 7.5000 mg | ORAL_TABLET | Freq: Two times a day (BID) | ORAL | 2 refills | Status: DC | PRN

## 2016-12-03 NOTE — Progress Notes (Signed)
Office Visit Note   Patient: Kevin SpellerHarry B Yoo           Date of Birth: 06-05-1961           MRN: [id number] Visit Date: 12/03/2016              Requested by: No referring provider defined for this encounter. PCP: No PCP Per Patient   Assessment & Plan: Visit Diagnoses:  1. Chronic pain of left knee     Plan: MRI left knee order to rule out medial meniscal tear. Meloxicam refill. Follow-up after MRI.  Follow-Up Instructions: Return in about 10 days (around 12/13/2016).   Orders:  Orders Placed This Encounter  Procedures  . MR Knee Left w/o contrast   Meds ordered this encounter  Medications  . meloxicam (MOBIC) 7.5 MG tablet    Sig: Take 1 tablet (7.5 mg total) by mouth 2 (two) times daily as needed for pain.    Dispense:  30 tablet    Refill:  2      Procedures: No procedures performed   Clinical Data: No additional findings.   Subjective: No chief complaint on file.   Patient is 56 year old gentleman who comes in with left knee pain since mid-November. He's had an injection with partial relief. He endorses subjective swelling. Meloxicam helps. Ibuprofen does not help.    Review of Systems   Objective: Vital Signs: There were no vitals taken for this visit.  Physical Exam  Constitutional: He is oriented to person, place, and time. He appears well-developed and well-nourished.  HENT:  Head: Normocephalic and atraumatic.  Eyes: Pupils are equal, round, and reactive to light.  Neck: Neck supple.  Pulmonary/Chest: Effort normal.  Abdominal: Soft.  Musculoskeletal: Normal range of motion.  Neurological: He is alert and oriented to person, place, and time.  Skin: Skin is warm.  Psychiatric: He has a normal mood and affect. His behavior is normal. Judgment and thought content normal.  Nursing note and vitals reviewed.   Ortho Exam Exam of the left knee shows no joint effusion. He has tenderness posterior medially. Closed cruciate's are  stable. Specialty Comments:  No specialty comments available.  Imaging: No results found.   PMFS History: Patient Active Problem List   Diagnosis Date Noted  . Chronic pain of left knee 12/03/2016  . Adjustment disorder with mixed anxiety and depressed mood   . MDD (major depressive disorder) 09/12/2015  . Adjustment disorder with depressed mood 09/12/2015  . BPH (benign prostatic hyperplasia) 06/22/2013  . Dysuria 06/21/2013  . UTI (lower urinary tract infection) 06/21/2013  . BV (bacterial vaginosis) 06/21/2013   Past Medical History:  Diagnosis Date  . Arthritis    knee  . Asthma    as child  . Hand fracture, right   . Hypertension    no meds    Family History  Problem Relation Age of Onset  . Cancer Mother   . Heart attack Father   . Diabetes Brother     Past Surgical History:  Procedure Laterality Date  . FINGER ARTHRODESIS Right 12/13/2015   Procedure: RIGHT 4TH AND 5TH CARPOMETACARPAL ARTHRODESIS;  Surgeon: Tarry KosNaiping M Rashika Bettes, MD;  Location: [facility];  Service: Orthopedics;  Laterality: Right;  . TRANSURETHRAL RESECTION OF PROSTATE N/A 09/22/2013   Procedure: TRANSURETHRAL RESECTION OF THE PROSTATE WITH GYRUS INSTRUMENTS;  Surgeon: Sebastian Acheheodore Manny, MD;  Location: [facility];  Service: Urology;  Laterality: N/A;   Social History   Occupational History  .  Not on file.   Social History Main Topics  . Smoking status: Current Every Day Smoker    Packs/day: 1.00    Years: 30.00    Types: Cigarettes  . Smokeless tobacco: Not on file  . Alcohol use Yes     Comment: occassionally, 2-3 times per month  . Drug use: No  . Sexual activity: Yes

## 2016-12-08 ENCOUNTER — Ambulatory Visit
Admission: RE | Admit: 2016-12-08 | Discharge: 2016-12-08 | Disposition: A | Discharge status: Home / Self Care | Payer: Managed Care, Other (non HMO) | Source: Ambulatory Visit | Attending: Orthopaedic Surgery | Admitting: Orthopaedic Surgery

## 2016-12-08 DIAGNOSIS — G8929 Other chronic pain: Secondary | ICD-10-CM

## 2016-12-08 DIAGNOSIS — M25562 Pain in left knee: Principal | ICD-10-CM

## 2016-12-11 ENCOUNTER — Ambulatory Visit (INDEPENDENT_AMBULATORY_CARE_PROVIDER_SITE_OTHER): Payer: 59 | Admitting: Orthopaedic Surgery

## 2016-12-11 ENCOUNTER — Encounter (INDEPENDENT_AMBULATORY_CARE_PROVIDER_SITE_OTHER): Payer: Self-pay | Admitting: Orthopaedic Surgery

## 2016-12-11 DIAGNOSIS — G8929 Other chronic pain: Secondary | ICD-10-CM

## 2016-12-11 DIAGNOSIS — M25562 Pain in left knee: Secondary | ICD-10-CM | POA: Diagnosis not present

## 2016-12-11 NOTE — Progress Notes (Signed)
   Office Visit Note   Patient: Kevin SpellerHarry B Peffley           Date of Birth: 19-Mar-1961           MRN: [id number] Visit Date: 12/11/2016              Requested by: No referring provider defined for this encounter. PCP: No PCP Per Patient   Assessment & Plan: Visit Diagnoses:  1. Chronic pain of left knee     Plan: MRI shows medial meniscocapsular tear and mild intrasubstance degenerative changes of MM.  Would recommend conservative treatment and tear should heal.  Continue nsaids as needed.  F/u prn.  Follow-Up Instructions: Return if symptoms worsen or fail to improve.   Orders:  No orders of the defined types were placed in this encounter.  No orders of the defined types were placed in this encounter.     Procedures: No procedures performed   Clinical Data: No additional findings.   Subjective: Chief Complaint  Patient presents with  . Left Knee - Pain, Follow-up    Patient here for MRI review of left knee.  He has been symptomatic now for about 2 weeks.    Review of Systems   Objective: Vital Signs: There were no vitals taken for this visit.  Physical Exam  Ortho Exam Left knee exam shows no effusion.  Normal ROM.  No joint line tenderness. Specialty Comments:  No specialty comments available.  Imaging: No results found.   PMFS History: Patient Active Problem List   Diagnosis Date Noted  . Chronic pain of left knee 12/03/2016  . Adjustment disorder with mixed anxiety and depressed mood   . MDD (major depressive disorder) 09/12/2015  . Adjustment disorder with depressed mood 09/12/2015  . BPH (benign prostatic hyperplasia) 06/22/2013  . Dysuria 06/21/2013  . UTI (lower urinary tract infection) 06/21/2013  . BV (bacterial vaginosis) 06/21/2013   Past Medical History:  Diagnosis Date  . Arthritis    knee  . Asthma    as child  . Hand fracture, right   . Hypertension    no meds    Family History  Problem Relation Age of Onset  . Cancer  Mother   . Heart attack Father   . Diabetes Brother     Past Surgical History:  Procedure Laterality Date  . FINGER ARTHRODESIS Right 12/13/2015   Procedure: RIGHT 4TH AND 5TH CARPOMETACARPAL ARTHRODESIS;  Surgeon: Tarry KosNaiping M Xu, MD;  Location: [facility];  Service: Orthopedics;  Laterality: Right;  . TRANSURETHRAL RESECTION OF PROSTATE N/A 09/22/2013   Procedure: TRANSURETHRAL RESECTION OF THE PROSTATE WITH GYRUS INSTRUMENTS;  Surgeon: Sebastian Acheheodore Manny, MD;  Location: [facility];  Service: Urology;  Laterality: N/A;   Social History   Occupational History  . Not on file.   Social History Main Topics  . Smoking status: Current Every Day Smoker    Packs/day: 1.00    Years: 30.00    Types: Cigarettes  . Smokeless tobacco: Never Used  . Alcohol use Yes     Comment: occassionally, 2-3 times per month  . Drug use: No  . Sexual activity: Yes

## 2016-12-12 ENCOUNTER — Ambulatory Visit (INDEPENDENT_AMBULATORY_CARE_PROVIDER_SITE_OTHER): Payer: 59 | Admitting: Orthopaedic Surgery

## 2017-05-15 NOTE — Progress Notes (Signed)
   Subjective:   Patient ID: Kevin Fitzgerald    DOB: 1961-01-28, 56 y.o. male   MRN: [id number]  CC: "Establish care"  HPI: Kevin Fitzgerald is a 56 y.o. male who presents to clinic today to establish care. Problems discussed today are as follows:  Health maintenance: Has never been screened for colon cancer. Denies family h/o colorectal cancer. Also never screened for HCV. ROS: Denies melena or hematochezia, constipation or diarrhea.  Erectile dysfunction: Having difficult getting and maintaining erection. Says it has been more difficult since prostate surgery but was an issue prior to surgery. Cialis was helpful in the past. Has also tried Viagra but prefers Cialis.  Smoking: Currently smoking 1/2 ppd. Has been smoking since age 59. Has made many attempts to quit in past with longest lasting 6 months. Has never tried with medications or nicotine replacement. Currently interested in stopping.  ROS: Denies fevers or chills, chest pain, dyspnea, cough, hemoptysis, change in weight.  Complete ROS performed, see HPI for pertinent.  [place]: Tobacco use disorder, BPH s/p prostatectomy 2014, MDD. Hand fx repair 2018. Mother cancer, father heart dz, brother DM. Smoking status reviewed. Medications reviewed.  Objective:   BP 110/70   Pulse 68   Temp 98.3 F (36.8 C) (Oral)   Ht '5\' 8"'$  (1.727 m)   Wt 162 lb (73.5 kg)   SpO2 98%   BMI 24.63 kg/m  Vitals and nursing note reviewed.  General: well nourished, well developed, in no acute distress with non-toxic appearance HEENT: normocephalic, atraumatic, moist mucous membranes Neck: supple, non-tender without lymphadenopathy CV: regular rate and rhythm without murmurs, rubs, or gallops, no lower extremity edema Lungs: clear to auscultation bilaterally with normal work of breathing Abdomen: soft, non-tender, non-distended, no masses or organomegaly palpable, normoactive bowel sounds Skin: warm, dry, no rashes or lesions, cap refill < 2  seconds Extremities: warm and well perfused, normal tone  Assessment & Plan:   Erectile dysfunction Chronic. Sounds organic by hx with improvement with Cialis in past. No nitrates. --Given Cialis 10 mg daily PRN  Routine health maintenance No h/o colonoscopy. Overdue w/o symptoms concerning for colorectal cancer. No h/o HCV screen. PHQ-9 score 13 but checked not difficult at all for problems or difficulty. --Will obtain HCV ab with reflex --Given info to call and schedule colonscopy --Will f/u MDD  Tobacco use disorder Chronic. Interested in quitting. Many previous attempts w/o med or nicotine replacement. --Will discuss option at next visit including nicotine replacement and alternative options  Orders Placed This Encounter  Procedures  . CBC with Differential/Platelet  . CMP14+EGFR  . Hepatitis C antibody   Meds ordered this encounter  Medications  . tadalafil (CIALIS) 10 MG tablet    Sig: Take 1 tablet (10 mg total) by mouth daily as needed for erectile dysfunction.    Dispense:  30 tablet    Refill:  [place], [place], PGY-2 05/18/2017 10:10 AM

## 2017-05-16 ENCOUNTER — Ambulatory Visit (INDEPENDENT_AMBULATORY_CARE_PROVIDER_SITE_OTHER): Payer: 59 | Admitting: Family Medicine

## 2017-05-16 ENCOUNTER — Encounter: Payer: Self-pay | Admitting: Family Medicine

## 2017-05-16 VITALS — BP 110/70 | HR 68 | Temp 98.3°F | Ht 68.0 in | Wt 162.0 lb

## 2017-05-16 DIAGNOSIS — Z1159 Encounter for screening for other viral diseases: Secondary | ICD-10-CM | POA: Diagnosis not present

## 2017-05-16 DIAGNOSIS — N400 Enlarged prostate without lower urinary tract symptoms: Secondary | ICD-10-CM

## 2017-05-16 DIAGNOSIS — Z0000 Encounter for general adult medical examination without abnormal findings: Secondary | ICD-10-CM | POA: Insufficient documentation

## 2017-05-16 DIAGNOSIS — N529 Male erectile dysfunction, unspecified: Secondary | ICD-10-CM | POA: Insufficient documentation

## 2017-05-16 DIAGNOSIS — F17200 Nicotine dependence, unspecified, uncomplicated: Secondary | ICD-10-CM

## 2017-05-16 MED ORDER — TADALAFIL 10 MG PO TABS: 10.0000 mg | ORAL_TABLET | Freq: Every day | ORAL | 3 refills | Status: DC | PRN

## 2017-05-16 NOTE — Assessment & Plan Note (Addendum)
Chronic. Sounds organic by hx with improvement with Cialis in past. No nitrates. --Given Cialis 10 mg daily PRN

## 2017-05-16 NOTE — Assessment & Plan Note (Addendum)
Chronic. Interested in quitting. Many previous attempts w/o med or nicotine replacement. --Will discuss option at next visit including nicotine replacement and alternative options

## 2017-05-16 NOTE — Assessment & Plan Note (Addendum)
No h/o colonoscopy. Overdue w/o symptoms concerning for colorectal cancer. No h/o HCV screen. PHQ-9 score 13 but checked not difficult at all for problems or difficulty. --Will obtain HCV ab with reflex --Given info to call and schedule colonscopy --Will f/u MDD

## 2017-05-16 NOTE — Patient Instructions (Signed)
Thank you for coming in to see Kevin Fitzgerald today. Please see below to review our plan for today's visit.  1. I have prescribed you a new medication called Cialis. Please take 1 tablet 30 minutes at a time. Common side effects include headache. I have attached information below about this medication. 2. Expect to receive your blood work results in the mail. I will call you if there are any abnormal results. 3. Call one of the GI specialists on the paper I gave you and request a screening colonoscopy.  Return to clinic in 4 weeks for follow-up.  Please call the clinic at [phone] if your symptoms worsen or you have any concerns. It was my pleasure to see you. -- Durward Parcel, DO [facility], PGY-2   Colonoscopy, Adult A colonoscopy is an exam to look at the large intestine. It is done to check for problems, such as:  Lumps (tumors).  Growths (polyps).  Swelling (inflammation).  Bleeding.  What happens before the procedure? Eating and drinking Follow instructions from your doctor about eating and drinking. These instructions may include:  A few days before the procedure - follow a low-fiber diet. ? Avoid nuts. ? Avoid seeds. ? Avoid dried fruit. ? Avoid raw fruits. ? Avoid vegetables.  1-3 days before the procedure - follow a clear liquid diet. Avoid liquids that have red or purple dye. Drink only clear liquids, such as: ? Clear broth or bouillon. ? Black coffee or tea. ? Clear juice. ? Clear soft drinks or sports drinks. ? Gelatin dessert. ? Popsicles.  On the day of the procedure - do not eat or drink anything during the 2 hours before the procedure.  Bowel prep If you were prescribed an oral bowel prep:  Take it as told by your doctor. Starting the day before your procedure, you will need to drink a lot of liquid. The liquid will cause you to poop (have bowel movements) until your poop is almost clear or light green.  If your skin or butt gets  irritated from diarrhea, you may: ? Wipe the area with wipes that have medicine in them, such as adult wet wipes with aloe and vitamin E. ? Put something on your skin that soothes the area, such as petroleum jelly.  If you throw up (vomit) while drinking the bowel prep, take a break for up to 60 minutes. Then begin the bowel prep again. If you keep throwing up and you cannot take the bowel prep without throwing up, call your doctor.  General instructions  Ask your doctor about changing or stopping your normal medicines. This is important if you take diabetes medicines or blood thinners.  Plan to have someone take you home from the hospital or clinic. What happens during the procedure?  An IV tube may be put into one of your veins.  You will be given medicine to help you relax (sedative).  To reduce your risk of infection: ? Your doctors will wash their hands. ? Your anal area will be washed with soap.  You will be asked to lie on your side with your knees bent.  Your doctor will get a long, thin, flexible tube ready. The tube will have a camera and a light on the end.  The tube will be put into your anus.  The tube will be gently put into your large intestine.  Air will be delivered into your large intestine to keep it open. You may feel some pressure or  cramping.  The camera will be used to take photos.  A small tissue sample may be removed from your body to be looked at under a microscope (biopsy). If any possible problems are found, the tissue will be sent to a lab for testing.  If small growths are found, your doctor may remove them and have them checked for cancer.  The tube that was put into your anus will be slowly removed. The procedure may vary among doctors and hospitals. What happens after the procedure?  Your doctor will check on you often until the medicines you were given have worn off.  Do not drive for 24 hours after the procedure.  You may have a small  amount of blood in your poop.  You may pass gas.  You may have mild cramps or bloating in your belly (abdomen).  It is up to you to get the results of your procedure. Ask your doctor, or the department performing the procedure, when your results will be ready. This information is not intended to replace advice given to you by your health care provider. Make sure you discuss any questions you have with your health care provider. Document Released: 11/23/2010 Document Revised: 08/21/2016 Document Reviewed: 01/02/2016 Elsevier Interactive Patient Education  2017 Elsevier Inc.   Tadalafil tablets (Cialis) What is this medicine? TADALAFIL (tah DA la fil) is used to treat erection problems in men. It is also used for enlargement of the prostate gland in men, a condition called benign prostatic hyperplasia or BPH. This medicine improves urine flow and reduces BPH symptoms. This medicine can also treat both erection problems and BPH when they occur together. This medicine may be used for other purposes; ask your health care provider or pharmacist if you have questions. COMMON BRAND NAME(S): Cialis What should I tell my health care provider before I take this medicine? They need to know if you have any of these conditions: -bleeding disorders -eye or vision problems, including a rare inherited eye disease called retinitis pigmentosa -anatomical deformation of the penis, Peyronie's disease, or history of priapism (painful and prolonged erection) -heart disease, angina, a history of heart attack, irregular heart beats, or other heart problems -high or low blood pressure -history of blood diseases, like sickle cell anemia or leukemia -history of stomach bleeding -kidney disease -liver disease -stroke -an unusual or allergic reaction to tadalafil, other medicines, foods, dyes, or preservatives -pregnant or trying to get pregnant -breast-feeding How should I use this medicine? Take this medicine  by mouth with a glass of water. Follow the directions on the prescription label. You may take this medicine with or without meals. When this medicine is used for erection problems, your doctor may prescribe it to be taken once daily or as needed. If you are taking the medicine as needed, you may be able to have sexual activity 30 minutes after taking it and for up to 36 hours after taking it. Whether you are taking the medicine as needed or once daily, you should not take more than one dose per day. If you are taking this medicine for symptoms of benign prostatic hyperplasia (BPH) or to treat both BPH and an erection problem, take the dose once daily at about the same time each day. Do not take your medicine more often than directed. Talk to your pediatrician regarding the use of this medicine in children. Special care may be needed. Overdosage: If you think you have taken too much of this medicine contact a poison  control center or emergency room at once. NOTE: This medicine is only for you. Do not share this medicine with others. What if I miss a dose? If you are taking this medicine as needed for erection problems, this does not apply. If you miss a dose while taking this medicine once daily for an erection problem, benign prostatic hyperplasia, or both, take it as soon as you remember, but do not take more than one dose per day. What may interact with this medicine? Do not take this medicine with any of the following medications: -nitrates like amyl nitrite, isosorbide dinitrate, isosorbide mononitrate, nitroglycerin -other medicines for erectile dysfunction like avanafil, sildenafil, vardenafil -other tadalafil products (Adcirca) -riociguat This medicine may also interact with the following medications: -certain drugs for high blood pressure -certain drugs for the treatment of HIV infection or AIDS -certain drugs used for fungal or yeast infections, like fluconazole, itraconazole, ketoconazole,  and voriconazole -certain drugs used for seizures like carbamazepine, phenytoin, and phenobarbital -grapefruit juice -macrolide antibiotics like clarithromycin, erythromycin, troleandomycin -medicines for prostate problems -rifabutin, rifampin or rifapentine This list may not describe all possible interactions. Give your health care provider a list of all the medicines, herbs, non-prescription drugs, or dietary supplements you use. Also tell them if you smoke, drink alcohol, or use illegal drugs. Some items may interact with your medicine. What should I watch for while using this medicine? If you notice any changes in your vision while taking this drug, call your doctor or health care professional as soon as possible. Stop using this medicine and call your health care provider right away if you have a loss of sight in one or both eyes. Contact your doctor or health care professional right away if the erection lasts longer than 4 hours or if it becomes painful. This may be a sign of serious problem and must be treated right away to prevent permanent damage. If you experience symptoms of nausea, dizziness, chest pain or arm pain upon initiation of sexual activity after taking this medicine, you should refrain from further activity and call your doctor or health care professional as soon as possible. Do not drink alcohol to excess (examples, 5 glasses of wine or 5 shots of whiskey) when taking this medicine. When taken in excess, alcohol can increase your chances of getting a headache or getting dizzy, increasing your heart rate or lowering your blood pressure. Using this medicine does not protect you or your partner against HIV infection (the virus that causes AIDS) or other sexually transmitted diseases. What side effects may I notice from receiving this medicine? Side effects that you should report to your doctor or health care professional as soon as possible: -allergic reactions like skin rash,  itching or hives, swelling of the face, lips, or tongue -breathing problems -changes in hearing -changes in vision -chest pain -fast, irregular heartbeat -prolonged or painful erection -seizures Side effects that usually do not require medical attention (report to your doctor or health care professional if they continue or are bothersome): -back pain -dizziness -flushing -headache -indigestion -muscle aches -nausea -stuffy or runny nose This list may not describe all possible side effects. Call your doctor for medical advice about side effects. You may report side effects to FDA at 1-800-FDA-1088. Where should I keep my medicine? Keep out of the reach of children. Store at room temperature between 15 and 30 degrees C (59 and 86 degrees F). Throw away any unused medicine after the expiration date. NOTE: This sheet is a  summary. It may not cover all possible information. If you have questions about this medicine, talk to your doctor, pharmacist, or health care provider.  2018 Elsevier/Gold Standard (2014-03-11 13:15:49)

## 2017-05-17 LAB — CBC WITH DIFFERENTIAL/PLATELET
BASOS ABS: 0.1 10*3/uL (ref 0.0–0.2)
Basos: 1 %
EOS (ABSOLUTE): 0.5 10*3/uL — ABNORMAL HIGH (ref 0.0–0.4)
Eos: 6 %
HEMOGLOBIN: 13.5 g/dL (ref 13.0–17.7)
Hematocrit: 40.2 % (ref 37.5–51.0)
Immature Grans (Abs): 0 10*3/uL (ref 0.0–0.1)
Immature Granulocytes: 0 %
LYMPHS ABS: 3.1 10*3/uL (ref 0.7–3.1)
Lymphs: 37 %
MCH: 29.3 pg (ref 26.6–33.0)
MCHC: 33.6 g/dL (ref 31.5–35.7)
MCV: 87 fL (ref 79–97)
MONOCYTES: 7 %
MONOS ABS: 0.6 10*3/uL (ref 0.1–0.9)
NEUTROS ABS: 4.1 10*3/uL (ref 1.4–7.0)
Neutrophils: 49 %
Platelets: 169 10*3/uL (ref 150–379)
RBC: 4.6 x10E6/uL (ref 4.14–5.80)
RDW: 13.7 % (ref 12.3–15.4)
WBC: 8.4 10*3/uL (ref 3.4–10.8)

## 2017-05-17 LAB — CMP14+EGFR
ALBUMIN: 4.2 g/dL (ref 3.5–5.5)
ALK PHOS: 94 IU/L (ref 39–117)
ALT: 13 IU/L (ref 0–44)
AST: 16 IU/L (ref 0–40)
Albumin/Globulin Ratio: 1.6 (ref 1.2–2.2)
BILIRUBIN TOTAL: 0.2 mg/dL (ref 0.0–1.2)
BUN / CREAT RATIO: 19 (ref 9–20)
BUN: 15 mg/dL (ref 6–24)
CHLORIDE: 103 mmol/L (ref 96–106)
CO2: 27 mmol/L (ref 20–29)
Calcium: 9.8 mg/dL (ref 8.7–10.2)
Creatinine, Ser: 0.77 mg/dL (ref 0.76–1.27)
GFR calc Af Amer: 119 mL/min/{1.73_m2} (ref >59)
GFR calc non Af Amer: 103 mL/min/{1.73_m2} (ref >59)
GLOBULIN, TOTAL: 2.6 g/dL (ref 1.5–4.5)
GLUCOSE: 74 mg/dL (ref 65–99)
Potassium: 4.8 mmol/L (ref 3.5–5.2)
SODIUM: 141 mmol/L (ref 134–144)
Total Protein: 6.8 g/dL (ref 6.0–8.5)

## 2017-05-17 LAB — HEPATITIS C ANTIBODY

## 2017-05-18 ENCOUNTER — Encounter: Payer: Self-pay | Admitting: Family Medicine

## 2017-05-18 DIAGNOSIS — Z9079 Acquired absence of other genital organ(s): Secondary | ICD-10-CM | POA: Insufficient documentation

## 2017-05-19 ENCOUNTER — Encounter: Payer: Self-pay | Admitting: Family Medicine

## 2017-07-01 ENCOUNTER — Encounter: Payer: Self-pay | Admitting: Family Medicine

## 2017-07-01 ENCOUNTER — Ambulatory Visit (INDEPENDENT_AMBULATORY_CARE_PROVIDER_SITE_OTHER): Payer: 59 | Admitting: Family Medicine

## 2017-07-01 VITALS — BP 156/90 | HR 60 | Temp 97.8°F | Ht 68.0 in | Wt 165.0 lb

## 2017-07-01 DIAGNOSIS — F17200 Nicotine dependence, unspecified, uncomplicated: Secondary | ICD-10-CM

## 2017-07-01 DIAGNOSIS — N529 Male erectile dysfunction, unspecified: Secondary | ICD-10-CM | POA: Diagnosis not present

## 2017-07-01 DIAGNOSIS — Z0000 Encounter for general adult medical examination without abnormal findings: Secondary | ICD-10-CM

## 2017-07-01 DIAGNOSIS — N400 Enlarged prostate without lower urinary tract symptoms: Secondary | ICD-10-CM | POA: Diagnosis not present

## 2017-07-01 DIAGNOSIS — M7712 Lateral epicondylitis, left elbow: Secondary | ICD-10-CM | POA: Diagnosis not present

## 2017-07-01 LAB — HEMOCCULT GUIAC POC 1CARD (OFFICE): Fecal Occult Blood, POC: NEGATIVE

## 2017-07-01 MED ORDER — DICLOFENAC SODIUM 1 % TD GEL: 2.0000 g | Freq: Four times a day (QID) | TRANSDERMAL | 3 refills | Status: DC

## 2017-07-01 NOTE — Assessment & Plan Note (Addendum)
Acute. 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 Minimal improvement with NSAID. Patient is right-handed. --We will trial with diclofenac gel 4 times daily as needed --Work note provided, recommended patient abstain from using left arm has often --RTC one month

## 2017-07-01 NOTE — Assessment & Plan Note (Addendum)
Chronic. Continues to smoke 0.5 packs per day. Patient motivated to quit with set quit date on 07/05/2017. Not interested in nicotine replacement or alternative therapies. --Given 1-800-quit-now, also discussed Chantix and other therapies to consider --Fasting lipid panel ordered, patient to return when convenient --RTC one month

## 2017-07-01 NOTE — Assessment & Plan Note (Deleted)
A 

## 2017-07-01 NOTE — Patient Instructions (Addendum)
Thank you for coming in to see [place] today. Please see below to review our plan for today's visit.  1. Continue taking the Cialis as prescribed. Take it earlier if the effects are not experienced until later in the evening. You have 3 more refills on this medication. 2. I've given her a prescription for Voltaren gel. Apply this to the left arm were you are experiencing pain. This is likely related to repetitive motion due to your job. I would advise you to try to avoid using her left arm as much as possible to help alleviate the pain over the next few weeks. We will revisit this at your next appointment. 3. Your quit date September 1. If you need medical assistance to help you quit, please consider the options I presented to you. Can also call 1-800-quit-now. 4. Please come in and get a fasting cholesterol panel at your convenience.  Please call the clinic at [phone] if your symptoms worsen or you have any concerns. It was my pleasure to see you. -- Durward Parcel, DO [facility], PGY-2   Tennis Elbow Tennis elbow is puffiness (inflammation) of the outer tendons of your forearm close to your elbow. Your tendons attach your muscles to your bones. Tennis elbow can happen in any sport or job in which you use your elbow too much. It is caused by doing the same motion over and over. Tennis elbow can cause:  Pain and tenderness in your forearm and the outer part of your elbow.  A burning feeling. This runs from your elbow through your arm.  Weak grip in your hands.  Follow these instructions at home: Activity  Rest your elbow and wrist as told by your doctor. Try to avoid any activities that caused the problem until your doctor says that you can do them again.  If a physical therapist teaches you exercises, do all of them as told.  If you lift an object, lift it with your palm facing up. This is easier on your elbow. Lifestyle  If your tennis elbow is caused by sports,  check your equipment and make sure that: ? You are using it correctly. ? It fits you well.  If your tennis elbow is caused by work, take breaks often, if you are able. Talk with your manager about doing your work in a way that is safe for you. ? If your tennis elbow is caused by computer use, talk with your manager about any changes that can be made to your work setup. General instructions  If told, apply ice to the painful area: ? Put ice in a plastic bag. ? Place a towel between your skin and the bag. ? Leave the ice on for 20 minutes, 2-3 times per day.  Take medicines only as told by your doctor.  If you were given a brace, wear it as told by your doctor.  Keep all follow-up visits as told by your doctor. This is important. Contact a doctor if:  Your pain does not get better with treatment.  Your pain gets worse.  You have weakness in your forearm, hand, or fingers.  You cannot feel your forearm, hand, or fingers. This information is not intended to replace advice given to you by your health care provider. Make sure you discuss any questions you have with your health care provider. Document Released: 04/10/2010 Document Revised: 06/20/2016 Document Reviewed: 10/17/2014 Elsevier Interactive Patient Education  [employer].

## 2017-07-01 NOTE — Assessment & Plan Note (Addendum)
Chronic. Improved with use of Cialis. --Continue Cialis 10 mg as needed

## 2017-07-01 NOTE — Progress Notes (Signed)
Subjective:   Patient ID: Kevin Fitzgerald    DOB: 1961-09-04, 56 y.o. male   MRN: [id number]  CC: "Physical exam"  HPI: Kevin Fitzgerald is a 56 y.o. male who presents to clinic today for physical exam. Problems discussed today are as follows:  Erectile dysfunction: Patient has been taking Cialis as prescribed since last visit. He takes it when he gets home from work around 5 PM but does not feel the effects and will around 4 AM. He states the medication is a little bit more pricey then he will like but is okay with staying on the medication for now. ROS: Denies priapism, chest pain, shortness of breath, headache.  Smoking: Patient continues to smoke 0.5 packs per day. He is interested in stopping without use of medications or additional therapies. He has smoked for approximately 20 years with a 30-pack-year history. Has made several attempts to stop on his own with his longest excess lasting 6 months. He states his kids are the reason he smokes. ROS: Denies chronic cough, hemoptysis, chest pain, shortness of breath, change in weight, dysphagia, hoarse voice.  Left elbow pain: Has been an issue for the past couple weeks. Works as a Nutritional therapist. No history of similar pain. Spares his right arm. States he has good strength in his left arm and has no issues using it. Is right-handed. No recent trauma. ROS: Denies decreased motor function, change in sensation.  Complete ROS performed, see HPI for pertinent.  PMFSH: Tobacco use disorder, BPH s/p prostatectomy 2014, MDD. Hand fx repair 2018. Family history cancer, heart disease, DM. Smoking status reviewed. Medications reviewed.  Objective:   BP (!) 156/90   Pulse 60   Temp 97.8 F (36.6 C) (Oral)   Ht 5\' 8"  (1.727 m)   Wt 165 lb (74.8 kg)   SpO2 99%   BMI 25.09 kg/m  Vitals and nursing note reviewed.  General: well nourished, well developed, in no acute distress with non-toxic appearance HEENT: normocephalic, atraumatic, moist mucous  membranes Neck: supple, non-tender without lymphadenopathy CV: regular rate and rhythm without murmurs, rubs, or gallops, no lower extremity edema Lungs: clear to auscultation bilaterally with normal work of breathing Abdomen: soft, non-tender, non-distended, no masses or organomegaly palpable, normoactive bowel sounds Rectal: accompanied by chaperone, no external hemorrhoids or tags, minimal stool in rectal vault, appropriate anal sphincter tone, prostate smooth bilaterally with mild enlargement without nodules, no gross blood appreciated Skin: warm, dry, no rashes or lesions, cap refill < 2 seconds Extremities: warm and well perfused, normal tone, tenderness on the left lateral epicondyle, positive Mill test, no atrophy or decreased sensation of extremities  Assessment & Plan:   Erectile dysfunction Chronic. Improved with use of Cialis. --Continue Cialis 10 mg as needed  Tobacco use disorder Chronic. Continues to smoke 0.5 packs per day. Patient motivated to quit with set quit date on 07/05/2017. Not interested in nicotine replacement or alternative therapies. --Given 1-800-quit-now, also discussed Chantix and other therapies to consider --Fasting lipid panel ordered, patient to return when convenient --RTC one month  Lateral epicondylitis, left elbow Acute. 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 Minimal improvement with NSAID. Patient is right-handed. --We will trial with diclofenac gel 4 times daily as needed --Work note provided, recommended patient abstain from using left arm has often --RTC one month  Orders Placed This Encounter  Procedures  . Lipid panel    Standing Status:   Future    Standing Expiration Date:   07/01/2018  .  Hemoccult - 1 Card (office)   Meds ordered this encounter  Medications  . diclofenac sodium (VOLTAREN) 1 % GEL    Sig: Apply 2 g topically 4 (four) times daily.    Dispense:  100 g    Refill:  3    Durward Parcel, DO [facility], PGY-2 07/01/2017 12:42 PM

## 2017-07-03 ENCOUNTER — Telehealth: Payer: Self-pay | Admitting: *Deleted

## 2017-07-03 NOTE — Telephone Encounter (Signed)
[facility]ac 1% gel. PA completed online at www.covermymeds.com. PA was approved via [employer] until 07/03/18. Reference number: [id number].  Clovis PuMartin, Tamika L, RN

## 2017-12-19 NOTE — Progress Notes (Signed)
   Subjective   Patient ID: Kevin Fitzgerald    DOB: May 21, 1961, 57 y.o. male   MRN: [id number]  CC: "Physical exam"  HPI: Kevin Fitzgerald is a 57 y.o. male who presents to clinic today for the following:  Smoking: Patient stopped cold Malaw[place]urkey as of the beginning of the year.  He originally smoked 1 pack/day since age 57.  Smoking 1 cigarette earlier this morning because he was stressed with his grandson.  He is not interested in nicotine replacement therapy.  He endorses cravings typically in the morning but has been using peppermint as substitute.  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  Patient denies fevers or chills, cough, shortness of breath, chest pain.  Prostate cancer screening: Has erectile dysfunction following transurethral prostatectomy in 2014.  Has been using Cialis with success.  Denies difficulty with urination, change in weight, hematuria.  Erectile dysfunction: Responding well to Cialis.  Patient able to afford medication without side effects.  ROS: see HPI for pertinent.  PMFSH: Tobacco use disorder, BPH, MDD.  Surgical history hand fx repair, TURP.  Family history cancer (mother), father heart disease, DM.  Smoking status reviewed.  Medications reviewed.  Objective   BP 132/82   Pulse 63   Temp 98.4 F (36.9 C) (Oral)   Ht 5\' 8"  (1.727 m)   Wt 168 lb 9.6 oz (76.5 kg)   SpO2 99%   BMI 25.64 kg/m  Vitals and nursing note reviewed.  General: well nourished, well developed, NAD with non-toxic appearance HEENT: normocephalic, atraumatic, moist mucous membranes, poor dentition Neck: supple, non-tender without lymphadenopathy Cardiovascular: regular rate and rhythm without murmurs, rubs, or gallops Lungs: clear to auscultation bilaterally with normal work of breathing Abdomen: soft, non-tender, non-distended, normoactive bowel sounds Skin: warm, dry, no rashes or lesions, cap  refill < 2 seconds Extremities: warm and well perfused, normal tone, no edema  Assessment & Plan   Tobacco use disorder Chronic.  Able to quit cold Malawiturkey for the past 1.5 months without need for nicotine replacement.  Patient does endorse smoking a cigarette earlier this morning due to stress.  Patient not interested in nicotine replacement as of today.  He is motivated. - Recommended patient trash all tobacco products to prevent relapse - Encouraged patient to contact us if he is interested in additional help cessation - LDL cholesterol, direct - Administered flu vaccine  Screening for prostate cancer Has history of TURP for BPH.  No red flags concerning for cancer as of today. - Annual PSA  Erectile dysfunction Chronic.  Tolerating Cialis well. - Refill Cialis 10 mg daily as needed  Orders Placed This Encounter  Procedures  . Flu Vaccine QUAD 36+ mos IM  . PSA  . LDL cholesterol, direct   Meds ordered this encounter  Medications  . tadalafil (CIALIS) 10 MG tablet    Sig: Take 1 tablet (10 mg total) by mouth daily as needed for erectile dysfunction.    Dispense:  30 tablet    Refill:  3    Durward Parcelavid McMullen, DO [facility], PGY-2 12/22/2017, 10:04 AM

## 2017-12-22 ENCOUNTER — Ambulatory Visit (INDEPENDENT_AMBULATORY_CARE_PROVIDER_SITE_OTHER): Payer: Managed Care, Other (non HMO) | Admitting: Family Medicine

## 2017-12-22 ENCOUNTER — Encounter: Payer: Self-pay | Admitting: Family Medicine

## 2017-12-22 ENCOUNTER — Other Ambulatory Visit: Payer: Self-pay

## 2017-12-22 VITALS — BP 132/82 | HR 63 | Temp 98.4°F | Ht 68.0 in | Wt 168.6 lb

## 2017-12-22 DIAGNOSIS — F17200 Nicotine dependence, unspecified, uncomplicated: Secondary | ICD-10-CM | POA: Diagnosis not present

## 2017-12-22 DIAGNOSIS — Z125 Encounter for screening for malignant neoplasm of prostate: Secondary | ICD-10-CM

## 2017-12-22 DIAGNOSIS — N529 Male erectile dysfunction, unspecified: Secondary | ICD-10-CM | POA: Diagnosis not present

## 2017-12-22 DIAGNOSIS — Z23 Encounter for immunization: Secondary | ICD-10-CM

## 2017-12-22 DIAGNOSIS — Z0000 Encounter for general adult medical examination without abnormal findings: Secondary | ICD-10-CM

## 2017-12-22 MED ORDER — TADALAFIL 10 MG PO TABS: 10.0000 mg | ORAL_TABLET | Freq: Every day | ORAL | 3 refills | Status: DC | PRN

## 2017-12-22 NOTE — Patient Instructions (Signed)
Thank you for coming in to see us today. Please see below to review our plan for today's visit.  1.  I am proud of you for stopping cigarette smoking!  The next step is free to remove all tobacco products from being accessible.  Do this immediately today to prevent you from being tempted.  If you continue to have cravings and want to discuss nicotine replacement, please let me know. 2.  I will check your prostate level and notify you based on the results.  We will also check your cholesterol level. 3.  I sent in a refill of your Cialis.  Please call the clinic at [phone] if your symptoms worsen or you have any concerns. It was our pleasure to serve you.  Durward Parcelavid Dolce Sylvia, DO [facility], PGY-2

## 2017-12-22 NOTE — Assessment & Plan Note (Addendum)
Chronic.  Able to quit cold Mal[place]urkey for the past 1.5 months without need for nicotine replacement.  Patient does endorse smoking a cigarette earlier this morning due to stress.  Patient not interested in nicotine replacement as of today.  He is motivated. - Recommended patient trash all tobacco products to prevent relapse - Encouraged patient to contact us if he is interested in additional help cessation - LDL cholesterol, direct - Administered flu vaccine

## 2017-12-22 NOTE — Assessment & Plan Note (Addendum)
Has history of TURP for BPH.  No red flags concerning for cancer as of today. - Annual PSA

## 2017-12-22 NOTE — Assessment & Plan Note (Addendum)
Chronic.  Tolerating Cialis well. - Refill Cialis 10 mg daily as needed

## 2017-12-23 LAB — PSA: PROSTATE SPECIFIC AG, SERUM: 0.6 ng/mL (ref 0.0–4.0)

## 2017-12-23 LAB — LDL CHOLESTEROL, DIRECT: LDL Direct: 114 mg/dL — ABNORMAL HIGH (ref 0–99)

## 2017-12-24 ENCOUNTER — Encounter: Payer: Self-pay | Admitting: Family Medicine

## 2018-01-21 ENCOUNTER — Other Ambulatory Visit: Payer: Self-pay

## 2018-01-21 ENCOUNTER — Encounter (HOSPITAL_COMMUNITY): Payer: Self-pay | Admitting: Emergency Medicine

## 2018-01-21 ENCOUNTER — Emergency Department (HOSPITAL_COMMUNITY)
Admission: EM | Admit: 2018-01-21 | Discharge: 2018-01-21 | Disposition: A | Discharge status: Home / Self Care | Payer: Managed Care, Other (non HMO) | Attending: Emergency Medicine | Admitting: Emergency Medicine

## 2018-01-21 DIAGNOSIS — F17210 Nicotine dependence, cigarettes, uncomplicated: Secondary | ICD-10-CM | POA: Diagnosis not present

## 2018-01-21 DIAGNOSIS — Z79899 Other long term (current) drug therapy: Secondary | ICD-10-CM | POA: Diagnosis not present

## 2018-01-21 DIAGNOSIS — S61211A Laceration without foreign body of left index finger without damage to nail, initial encounter: Secondary | ICD-10-CM | POA: Diagnosis present

## 2018-01-21 DIAGNOSIS — Y929 Unspecified place or not applicable: Secondary | ICD-10-CM | POA: Diagnosis not present

## 2018-01-21 DIAGNOSIS — W260XXA Contact with knife, initial encounter: Secondary | ICD-10-CM | POA: Diagnosis not present

## 2018-01-21 DIAGNOSIS — J45909 Unspecified asthma, uncomplicated: Secondary | ICD-10-CM | POA: Diagnosis not present

## 2018-01-21 DIAGNOSIS — Y999 Unspecified external cause status: Secondary | ICD-10-CM | POA: Diagnosis not present

## 2018-01-21 DIAGNOSIS — Y939 Activity, unspecified: Secondary | ICD-10-CM | POA: Diagnosis not present

## 2018-01-21 MED ORDER — CEPHALEXIN 500 MG PO CAPS: 500.0000 mg | ORAL_CAPSULE | Freq: Three times a day (TID) | ORAL | 0 refills | Status: DC

## 2018-01-21 MED ORDER — LIDOCAINE-EPINEPHRINE 2 %-1:200000 IJ SOLN
20.0000 mL | Freq: Once | INTRAMUSCULAR | Status: AC
Start: 2018-01-21 — End: 2018-01-21
  Administered 2018-01-21: 20 mL
  Filled 2018-01-21: qty 20

## 2018-01-21 NOTE — ED Triage Notes (Signed)
Pt reports finger laceration to L index finger three days ago. TDAP updated last year

## 2018-01-21 NOTE — Discharge Instructions (Signed)
Suture removal in 10 days

## 2018-01-21 NOTE — ED Provider Notes (Signed)
[facility] EMERGENCY DEPARTMENT Provider Note   CSN: [id number] Arrival date & time: 01/21/18  0439     History   Chief Complaint Chief Complaint  Patient presents with  . Extremity Laceration    HPI Kevin Fitzgerald is a 57 y.o. male.  HPI 57 yo male presents with laceration to the left index finger on the radial side.  This wound is 36 hours old.  His tetanus is up-to-date.  He initially did not seek treatment but presents the ER because of ongoing occasional bleeding.  Able to flex his finger without difficulty.  No other complaints.  Symptoms are mild in severity.  No erythema or drainage.  No fevers.  This occurred while using a knife   Past Medical History:  Diagnosis Date  . Arthritis    knee  . Asthma    as child  . Hand fracture, right     Patient Active Problem List   Diagnosis Date Noted  . Lateral epicondylitis, left elbow 07/01/2017  . S/P TURP 05/18/2017  . Erectile dysfunction 05/16/2017  . Tobacco use disorder 05/16/2017  . Screening for prostate cancer 05/16/2017  . Adjustment disorder with mixed anxiety and depressed mood   . MDD (major depressive disorder) 09/12/2015  . BPH (benign prostatic hyperplasia) 06/22/2013    Past Surgical History:  Procedure Laterality Date  . FINGER ARTHRODESIS Right 12/13/2015   Procedure: RIGHT 4TH AND 5TH CARPOMETACARPAL ARTHRODESIS;  Surgeon: Tarry Kos, MD;  Location: [facility];  Service: Orthopedics;  Laterality: Right;  . TRANSURETHRAL RESECTION OF PROSTATE N/A 09/22/2013   Procedure: TRANSURETHRAL RESECTION OF THE PROSTATE WITH GYRUS INSTRUMENTS;  Surgeon: Sebastian Ache, MD;  Location: [facility];  Service: Urology;  Laterality: N/A;       Home Medications    Prior to Admission medications   Medication Sig Start Date End Date Taking? Authorizing Provider  cephALEXin (KEFLEX) 500 MG capsule Take 1 capsule (500 mg total) by mouth 3 (three) times daily. 01/21/18   Azalia Bilis, MD  diclofenac sodium (VOLTAREN) 1 % GEL Apply 2 g topically 4 (four) times daily. 07/01/17   Wendee Beavers, DO  tadalafil (CIALIS) 10 MG tablet Take 1 tablet (10 mg total) by mouth daily as needed for erectile dysfunction. 12/22/17   Wendee Beavers, DO    Family History Family History  Problem Relation Age of Onset  . Cancer Mother   . Asthma Mother   . Stroke Mother   . Diabetes Mother   . Heart attack Father   . Diabetes Brother   . Asthma Brother   . Birth defects Brother   . Heart disease Brother     Social History Social History   Tobacco Use  . Smoking status: Current Every Day Smoker    Packs/day: 0.50    Years: 37.00    Pack years: 18.50    Types: Cigarettes  . Smokeless tobacco: Never Used  Substance Use Topics  . Alcohol use: Yes    Comment: occassionally, 2-3 times per month  . Drug use: No     Allergies   Patient has no known allergies.   Review of Systems Review of Systems  All other systems reviewed and are negative.    Physical Exam Updated Vital Signs BP 127/72 (BP Location: Right Arm)   Pulse 61   Temp 98 F (36.7 C) (Oral)   Resp 16   Ht 5\' 6"  (1.676 m)   Wt 70.8  kg (156 lb)   SpO2 100%   BMI 25.18 kg/m   Physical Exam  Constitutional: He is oriented to person, place, and time. He appears well-developed and well-nourished.  HENT:  Head: Normocephalic.  Eyes: EOM are normal.  Neck: Normal range of motion.  Pulmonary/Chest: Effort normal.  Abdominal: He exhibits no distension.  Musculoskeletal:  Laceration to the distal phalanx of the left index finger on the radial side.  No active bleeding at this time.  Full extension and flexion at the DIP and PIP joint of the left index finger.  No erythema or drainage.  No nailbed or nail involvement  Neurological: He is alert and oriented to person, place, and time.  Psychiatric: He has a normal mood and affect.  Nursing note and vitals reviewed.    ED Treatments / Results    Labs (all labs ordered are listed, but only abnormal results are displayed) Labs Reviewed - No data to display  EKG  EKG Interpretation None       Radiology No results found.  Procedures .Marland Kitchen.Laceration Repair Performed by: Azalia Bilisampos, Diannah Rindfleisch, MD Authorized by: Azalia Bilisampos, Obrian Bulson, MD   .Nerve Block Performed by: Azalia Bilisampos, Kyrah Schiro, MD Authorized by: Azalia Bilisampos, Syris Brookens, MD      NERVE BLOCK Performed by: Azalia BilisKevin Burlon Centrella Consent: Verbal consent obtained. Required items: required blood products, implants, devices, and special equipment available Time out: Immediately prior to procedure a "time out" was called to verify the correct patient, procedure, equipment, support staff and site/side marked as required. Indication:  Nerve block body site: index finger R, digital nerves Preparation: Patient was prepped and draped in the usual sterile fashion. Needle gauge: 24 G Location technique: anatomical landmarks Local anesthetic: lidocaine 2% with epi Anesthetic total: 5 ml Outcome: pain improved Patient tolerance: Patient tolerated the procedure well with no immediate complications.   LACERATION REPAIR Performed by: Azalia BilisKevin Dailan Pfalzgraf Consent: Verbal consent obtained. Risks and benefits: risks, benefits and alternatives were discussed Patient identity confirmed: provided demographic data Time out performed prior to procedure Prepped and Draped in normal sterile fashion Wound explored Laceration Location: left index finger Laceration Length: 1.5cm No Foreign Bodies seen or palpated Anesthesia: nerve block (see note) Irrigation method: syringe Amount of cleaning: copious Skin closure: 3-0 prolene Number of sutures or staples: 3 Technique: loose simple interrupted Patient tolerance: Patient tolerated the procedure well with no immediate complications.   Medications Ordered in ED Medications  lidocaine-EPINEPHrine (XYLOCAINE W/EPI) 2 %-1:200000 (PF) injection 20 mL (20 mLs Infiltration Given  01/21/18 0902)     Initial Impression / Assessment and Plan / ED Course  I have reviewed the triage vital signs and the nursing notes.  Pertinent labs & imaging results that were available during my care of the patient were reviewed by me and considered in my medical decision making (see chart for details).     Delayed closure. Home with abx. Copious irrigation at bedside sink with soap and water. Infection warnings given. Full function of flexor tendon  Final Clinical Impressions(s) / ED Diagnoses   Final diagnoses:  Laceration of left index finger without foreign body without damage to nail, initial encounter    ED Discharge Orders        Ordered    cephALEXin (KEFLEX) 500 MG capsule  3 times daily     01/21/18 0843       Azalia Bilisampos, Ralph Benavidez, MD 01/21/18 1017

## 2018-04-07 ENCOUNTER — Encounter

## 2018-05-22 ENCOUNTER — Telehealth: Payer: Self-pay | Admitting: Family Medicine

## 2018-05-22 NOTE — Telephone Encounter (Signed)
LVM to schedule next OV. Please assist in doing this.

## 2018-07-02 ENCOUNTER — Ambulatory Visit (INDEPENDENT_AMBULATORY_CARE_PROVIDER_SITE_OTHER): Payer: Managed Care, Other (non HMO) | Admitting: Family Medicine

## 2018-07-02 VITALS — BP 125/80 | HR 74 | Temp 98.1°F | Wt 161.4 lb

## 2018-07-02 DIAGNOSIS — E7849 Other hyperlipidemia: Secondary | ICD-10-CM

## 2018-07-02 DIAGNOSIS — Z122 Encounter for screening for malignant neoplasm of respiratory organs: Secondary | ICD-10-CM

## 2018-07-02 DIAGNOSIS — F17200 Nicotine dependence, unspecified, uncomplicated: Secondary | ICD-10-CM | POA: Diagnosis not present

## 2018-07-02 DIAGNOSIS — Z0000 Encounter for general adult medical examination without abnormal findings: Secondary | ICD-10-CM

## 2018-07-02 DIAGNOSIS — E785 Hyperlipidemia, unspecified: Secondary | ICD-10-CM | POA: Insufficient documentation

## 2018-07-02 MED ORDER — ATORVASTATIN CALCIUM 20 MG PO TABS: 20.0000 mg | ORAL_TABLET | Freq: Every day | ORAL | 3 refills | Status: DC

## 2018-07-02 NOTE — Patient Instructions (Addendum)
Thank you for coming in to see us today. Please see below to review our plan for today's visit.  1.  I am pleased to hear that you want to quit smoking today.  Please purchase over-the-counter nicotine gum and take as needed.  The pharmacist can discuss with you how to take this medication properly.  Please make sure that your children are aware that you want to quit so that they do not you have other cigarettes. 2.  You need to have a screening colonoscopy and or 5 years overdue.  This can help prevent colon cancer which is a second leading cause of cancer deaths in the [place] and is 1 of only a few that are preventable.  Please call 1 of the numbers on the sheet I provided you. 3.  We will start you on a cholesterol medication due to your risk for complications including strokes and heart attacks in the future.  If you develop any muscle aches particularly in the thighs, discontinue this medication and let me know.  Overall this is a clean medication with little to no side effects.  We will see you again in 1 month to recheck your cholesterol and smoking. 4.  I would like you for you to discuss with your wife regarding getting a CT scan to screen for lung cancer.  We can discuss this at your next visit.  Please call the clinic at [phone] if your symptoms worsen or you have any concerns. It was our pleasure to serve you.  Durward Parcelavid Mable Lashley, DO [facility], PGY-3

## 2018-07-02 NOTE — Progress Notes (Signed)
Subjective   Patient ID: Kevin SpellerHarry B Koltz    DOB: 1961-04-23, 57 y.o. male   MRN: [id number]  CC: "Physical exam"  HPI: Kevin Fitzgerald is a 57 y.o. male who presents to clinic today for the following:  Physical exam: Patient is here today for his annual physical exam.  He reports he needs this for his job.  He has no concerns today.  He is an everyday smoker.  He needs a colonoscopy and has no history of colon cancer screening.  He is up-to-date on his vaccinations.  He denies any fevers or chills, shortness of breath or chest pain, unexplained weight loss, abdominal pain, melena or hematochezia, hematuria.  Smoking: Current everyday smoker with 1-2 cigarettes/day.  Patient reports eating them because of "crazy grandkids."  He did throw away his cigarettes as we discussed during his last visit but his children smoke also which makes it easier for him to smoke.  He has attempted to quit "many times."  He is motivated in quitting today but would not like to start medication.  He is however interested in with nicotine gum on an as-needed basis.  Hyperlipidemia: Patient found to have elevated LDL at 114 during last visit.  He has no prior history of cholesterol medication.  Blood pressure remains controlled though he is an everyday smoker.  No history of diabetes.  ROS: see HPI for pertinent.  PMFSH: Tobacco use disorder, BPH, MDD. Surgical history hand fx repair, TURP.  Family history cancer (mother), father heart disease, DM. Smoking status reviewed. Medications reviewed.  Objective   BP 125/80 (BP Location: Right Arm, Patient Position: Sitting, Cuff Size: Normal)   Pulse 74   Temp 98.1 F (36.7 C) (Oral)   Wt 161 lb 6.4 oz (73.2 kg)   SpO2 99%   BMI 26.05 kg/m  Vitals and nursing note reviewed.  General: well nourished, well developed, NAD with non-toxic appearance HEENT: normocephalic, atraumatic, moist mucous membranes, poor dentition Neck: supple, non-tender without  lymphadenopathy Cardiovascular: regular rate and rhythm without murmurs, rubs, or gallops Lungs: clear to auscultation bilaterally with normal work of breathing Abdomen: soft, non-tender, non-distended, normoactive bowel sounds Skin: warm, dry, no rashes or lesions, cap refill < 2 seconds Extremities: warm and well perfused, normal tone, no edema  Assessment & Plan   Tobacco use disorder Chronic.  Minimal use of cigarettes.  Patient appears to be motivated to quit today.  Quit date set for 07/07/2018.  Due for LDCT.  Asymptomatic. - Instructed patient to purchase OTC nicotine gum as needed and discuss with his children about removing cigarettes and preventing him from borrowing - LDCT scheduled - RTC 1 month  Annual physical exam Primary issue is smoking.  No history of diabetes or hypertension.  Overdue for colonoscopy, no prior screening performed.  Up-to-date on vaccinations. - See plan for smoking cessation - Given form with instructions to schedule screening colonoscopy  Hyperlipemia Chronic.  Not currently on statin.  Meets criteria for moderate to high intensity statin.  Comorbidities include smoking. - Initiating Lipitor 20 mg daily - RTC 1 month for LDL check  Orders Placed This Encounter  Procedures  . CT CHEST LUNG CA SCREEN LOW DOSE W/O CM    Wt. 161 / No diab / no kid ca or dz / no solo kid or dialysis / Per Shelly  NKDA to CT IV contrast / No needs Ins. [employer] FH w [facility] HarryShelly   epic     Standing Status:  Future    Standing Expiration Date:   09/02/2019    Order Specific Question:   Reason for Exam (SYMPTOM  OR DIAGNOSIS REQUIRED)    Answer:   Screening    Order Specific Question:   Preferred Imaging Location?    Answer:   [facility]    Order Specific Question:   Radiology Contrast Protocol - do NOT remove file path    Answer:   [email or web address]  . LDL cholesterol, direct    Standing Status:   Future    Standing Expiration Date:    08/02/2018   Meds ordered this encounter  Medications  . atorvastatin (LIPITOR) 20 MG tablet    Sig: Take 1 tablet (20 mg total) by mouth daily.    Dispense:  90 tablet    Refill:  3    Durward Parcel, DO [facility], PGY-3 07/02/2018, 12:28 PM

## 2018-07-02 NOTE — Assessment & Plan Note (Signed)
Primary issue is smoking.  No history of diabetes or hypertension.  Overdue for colonoscopy, no prior screening performed.  Up-to-date on vaccinations. - See plan for smoking cessation - Given form with instructions to schedule screening colonoscopy

## 2018-07-02 NOTE — Assessment & Plan Note (Signed)
Chronic.  Minimal use of cigarettes.  Patient appears to be motivated to quit today.  Quit date set for 07/07/2018.  Due for LDCT.  Asymptomatic. - Instructed patient to purchase OTC nicotine gum as needed and discuss with his children about removing cigarettes and preventing him from borrowing - LDCT scheduled - RTC 1 month

## 2018-07-02 NOTE — Assessment & Plan Note (Addendum)
Chronic.  Not currently on statin.  Meets criteria for moderate to high intensity statin.  Comorbidities include smoking. - Initiating Lipitor 20 mg daily - RTC 1 month for LDL check

## 2018-07-14 ENCOUNTER — Ambulatory Visit: Payer: Self-pay

## 2018-08-03 ENCOUNTER — Ambulatory Visit: Payer: Self-pay | Admitting: Family Medicine

## 2018-08-03 NOTE — Progress Notes (Deleted)
   Subjective   Patient ID: Kevin Fitzgerald    DOB: [birth year], 57 y.o. male   MRN: [id number]  CC: "***"  HPI: Kevin Fitzgerald is a 57 y.o. male who presents to clinic today for the following:  ***: ***  ***Last seen 1 month ago for annual physical.  Scheduled 10/4 for LDCT given smoking history.  Patient purchase nicotine gum removing cigarettes from home.  Colonoscopy form.  Should be here today for LDL recheck following initiation of Lipitor for primary prevention.  ROS: see HPI for pertinent.  PMFSH: Tobacco use disorder, BPH, MDD.Surgical history hand fx repair, TURP.Family historycancer(mother), father heart disease, DM. Smoking status reviewed. Medications reviewed.  Objective   There were no vitals taken for this visit. Vitals and nursing note reviewed.  General: well nourished, well developed, NAD with non-toxic appearance HEENT: normocephalic, atraumatic, moist mucous membranes Neck: supple, non-tender without lymphadenopathy Cardiovascular: regular rate and rhythm without murmurs, rubs, or gallops Lungs: clear to auscultation bilaterally with normal work of breathing Abdomen: soft, non-tender, non-distended, normoactive bowel sounds Skin: warm, dry, no rashes or lesions, cap refill < 2 seconds Extremities: warm and well perfused, normal tone, no edema  Assessment & Plan   No problem-specific Assessment & Plan notes found for this encounter.  No orders of the defined types were placed in this encounter.  No orders of the defined types were placed in this encounter.   Durward Parcel, DO [facility], PGY-3 08/03/2018, 9:58 AM

## 2018-08-06 ENCOUNTER — Telehealth: Payer: Self-pay | Admitting: Family Medicine

## 2018-08-06 NOTE — Telephone Encounter (Signed)
Patient needs to have a 30-pack year history for lung cancer screening and so [facility] is needing extra information from [place] about his history to see if he qualifies..  Please call [facility] [phone].

## 2018-08-07 ENCOUNTER — Telehealth: Payer: Self-pay | Admitting: Family Medicine

## 2018-08-07 ENCOUNTER — Ambulatory Visit
Admission: RE | Admit: 2018-08-07 | Discharge: 2018-08-07 | Disposition: A | Discharge status: Home / Self Care | Payer: Managed Care, Other (non HMO) | Source: Ambulatory Visit | Attending: Family Medicine | Admitting: Family Medicine

## 2018-08-07 ENCOUNTER — Inpatient Hospital Stay: Admission: RE | Admit: 2018-08-07 | Payer: Self-pay | Source: Ambulatory Visit

## 2018-08-07 DIAGNOSIS — F17200 Nicotine dependence, unspecified, uncomplicated: Secondary | ICD-10-CM

## 2018-08-07 NOTE — Telephone Encounter (Signed)
[facility] has canceled the CT for today because under the rules for the number of packs he has smoked he does not qualify for this CT and his insurance will not pay for this. jw

## 2018-08-10 ENCOUNTER — Encounter: Payer: Self-pay | Admitting: Family Medicine

## 2018-08-30 ENCOUNTER — Ambulatory Visit (HOSPITAL_COMMUNITY)
Admission: EM | Admit: 2018-08-30 | Discharge: 2018-08-30 | Disposition: A | Discharge status: Home / Self Care | Payer: Managed Care, Other (non HMO) | Attending: Family Medicine | Admitting: Family Medicine

## 2018-08-30 ENCOUNTER — Encounter (HOSPITAL_COMMUNITY): Payer: Self-pay | Admitting: *Deleted

## 2018-08-30 DIAGNOSIS — K047 Periapical abscess without sinus: Secondary | ICD-10-CM

## 2018-08-30 DIAGNOSIS — K0889 Other specified disorders of teeth and supporting structures: Secondary | ICD-10-CM

## 2018-08-30 HISTORY — DX: Pure hypercholesterolemia, unspecified: E78.00

## 2018-08-30 MED ORDER — CHLORHEXIDINE GLUCONATE 0.12 % MT SOLN: 15.0000 mL | Freq: Two times a day (BID) | OROMUCOSAL | 0 refills | Status: DC

## 2018-08-30 MED ORDER — AMOXICILLIN-POT CLAVULANATE 875-125 MG PO TABS: 1.0000 | ORAL_TABLET | Freq: Two times a day (BID) | ORAL | 0 refills | Status: DC

## 2018-08-30 MED ORDER — HYDROCODONE-ACETAMINOPHEN 5-325 MG PO TABS: 1.0000 | ORAL_TABLET | Freq: Four times a day (QID) | ORAL | 0 refills | Status: DC | PRN

## 2018-08-30 MED ORDER — MELOXICAM 7.5 MG PO TABS: 7.5000 mg | ORAL_TABLET | Freq: Every day | ORAL | 0 refills | Status: DC

## 2018-08-30 NOTE — ED Triage Notes (Signed)
C/O left lower dental abscess onset 3 wks ago with progressive worsening.  Has dentist appt in 3 days.  States temp has been "up and down".  Taking IBU.

## 2018-08-30 NOTE — Discharge Instructions (Addendum)
Start Augmentin as directed for dental infection. Mobic as directed. You can take tylenol 650mg  for breakthrough pain. You can take Norco if still feeling pain, this contains 325mg  of tylenol in it. Do not go over 3000mg  of tylenol each day. Peridex for mouth wash. Follow up with dentist for further treatment and evaluation. If experiencing swelling of the throat, trouble breathing, trouble swallowing, leaning forward to breath, drooling, go to the emergency department for further evaluation.

## 2018-08-30 NOTE — ED Provider Notes (Signed)
[facility]    CSN: [id number] Arrival date & time: 08/30/18  1000     History   Chief Complaint Chief Complaint  Patient presents with  . Dental Pain    HPI Kevin Fitzgerald is a 57 y.o. male.   57 year old male comes in for 3 week history of dental pain and abscess. States he thinks a tooth to the lower left jaw had already been cracked, but never got it fixed. He has been having subjective fevers. No swelling of the throat, trouble breathing, trouble swallowing. Has had facial swelling to the area. Taking ibuprofen 800mg  QD every so often for pain.     Past Medical History:  Diagnosis Date  . Arthritis    knee  . Asthma    as child  . Hand fracture, right   . Hypercholesteremia     Patient Active Problem List   Diagnosis Date Noted  . Hyperlipemia 07/02/2018  . Lateral epicondylitis, left elbow 07/01/2017  . S/P TURP 05/18/2017  . Erectile dysfunction 05/16/2017  . Tobacco use disorder 05/16/2017  . Adjustment disorder with mixed anxiety and depressed mood   . MDD (major depressive disorder) 09/12/2015  . BPH (benign prostatic hyperplasia) 06/22/2013    Past Surgical History:  Procedure Laterality Date  . FINGER ARTHRODESIS Right 12/13/2015   Procedure: RIGHT 4TH AND 5TH CARPOMETACARPAL ARTHRODESIS;  Surgeon: Tarry Kos, MD;  Location: [facility];  Service: Orthopedics;  Laterality: Right;  . TRANSURETHRAL RESECTION OF PROSTATE N/A 09/22/2013   Procedure: TRANSURETHRAL RESECTION OF THE PROSTATE WITH GYRUS INSTRUMENTS;  Surgeon: Sebastian Ache, MD;  Location: [facility];  Service: Urology;  Laterality: N/A;       Home Medications    Prior to Admission medications   Medication Sig Start Date End Date Taking? Authorizing Provider  atorvastatin (LIPITOR) 20 MG tablet Take 1 tablet (20 mg total) by mouth daily. 07/02/18  Yes Wendee Beavers, DO  amoxicillin-clavulanate (AUGMENTIN) 875-125 MG tablet Take 1 tablet by mouth every 12  (twelve) hours. 08/30/18   Cathie Hoops, Daeshawn Redmann V, PA-C  chlorhexidine (PERIDEX) 0.12 % solution Use as directed 15 mLs in the mouth or throat 2 (two) times daily. 08/30/18   Cathie Hoops, Romell Cavanah V, PA-C  HYDROcodone-acetaminophen (NORCO/VICODIN) 5-325 MG tablet Take 1 tablet by mouth every 6 (six) hours as needed for severe pain. 08/30/18   Cathie Hoops, Esgar Barnick V, PA-C  meloxicam (MOBIC) 7.5 MG tablet Take 1 tablet (7.5 mg total) by mouth daily. 08/30/18   Cathie Hoops, Deondria Puryear V, PA-C  tadalafil (CIALIS) 10 MG tablet Take 1 tablet (10 mg total) by mouth daily as needed for erectile dysfunction. 12/22/17   Wendee Beavers, DO    Family History Family History  Problem Relation Age of Onset  . Cancer Mother   . Asthma Mother   . Stroke Mother   . Diabetes Mother   . Heart attack Father   . Diabetes Brother   . Asthma Brother   . Birth defects Brother   . Heart disease Brother     Social History Social History   Tobacco Use  . Smoking status: Current Every Day Smoker    Packs/day: 1.00    Years: 35.00    Pack years: 35.00    Types: Cigarettes  . Smokeless tobacco: Never Used  Substance Use Topics  . Alcohol use: Yes    Comment: occassionally, 2-3 times per month  . Drug use: No     Allergies  Patient has no known allergies.   Review of Systems Review of Systems  Reason unable to perform ROS: See HPI as above.     Physical Exam Triage Vital Signs ED Triage Vitals [08/30/18 1007]  Enc Vitals Group     BP (!) 153/83     Pulse Rate 67     Resp 16     Temp 98.3 F (36.8 C)     Temp src      SpO2 98 %     Weight      Height      Head Circumference      Peak Flow      Pain Score 8     Pain Loc      Pain Edu?      Excl. in GC?    No data found.  Updated Vital Signs BP (!) 153/83   Pulse 67   Temp 98.3 F (36.8 C)   Resp 16   SpO2 98%   Physical Exam  Constitutional: He is oriented to person, place, and time. He appears well-developed and well-nourished. No distress.  HENT:  Head: Normocephalic  and atraumatic.  Mouth/Throat: Uvula is midline, oropharynx is clear and moist and mucous membranes are normal. No trismus in the jaw. Dental abscesses present. No uvula swelling. No tonsillar exudate.    Tenderness to palpation along abscess area.   Floor of mouth soft to palpation. Left facial swelling to associated area.   Neck: Normal range of motion. Neck supple.  Neurological: He is alert and oriented to person, place, and time.  Skin: Skin is warm and dry. He is not diaphoretic.     UC Treatments / Results  Labs (all labs ordered are listed, but only abnormal results are displayed) Labs Reviewed - No data to display  EKG None  Radiology No results found.  Procedures Procedures (including critical care time)  Medications Ordered in UC Medications - No data to display  Initial Impression / Assessment and Plan / UC Course  I have reviewed the triage vital signs and the nursing notes.  Pertinent labs & imaging results that were available during my care of the patient were reviewed by me and considered in my medical decision making (see chart for details).    Discussed case with Dr Delton See, who suggested abx and pain control until patient's dental appointment in 3 days. Start augmentin. Symptomatic treatment as needed. Norco for breakthrough pain. Discussed with patient symptoms can return if dental problem is not addressed. Follow up with dentist for further evaluation and treatment of dental pain. Resources given. Return precautions given.   Final Clinical Impressions(s) / UC Diagnoses   Final diagnoses:  Dental abscess  Pain, dental    ED Prescriptions    Medication Sig Dispense Auth. Provider   amoxicillin-clavulanate (AUGMENTIN) 875-125 MG tablet Take 1 tablet by mouth every 12 (twelve) hours. 14 tablet Keiara Sneeringer V, PA-C   meloxicam (MOBIC) 7.5 MG tablet Take 1 tablet (7.5 mg total) by mouth daily. 15 tablet Zamariyah Furukawa V, PA-C   HYDROcodone-acetaminophen  (NORCO/VICODIN) 5-325 MG tablet Take 1 tablet by mouth every 6 (six) hours as needed for severe pain. 12 tablet Valerye Kobus V, PA-C   chlorhexidine (PERIDEX) 0.12 % solution Use as directed 15 mLs in the mouth or throat 2 (two) times daily. 120 mL Linward Headland V, PA-C     Controlled Substance Prescriptions [place] Controlled Substance Registry consulted? Yes, I have consulted the  Controlled Substances Registry  for this patient, and feel the risk/benefit ratio today is favorable for proceeding with this prescription for a controlled substance.   Belinda Fisher, PA-C 08/30/18 1036

## 2019-05-25 ENCOUNTER — Encounter: Payer: Self-pay | Admitting: Family Medicine

## 2019-05-25 ENCOUNTER — Other Ambulatory Visit: Payer: Self-pay

## 2019-05-25 ENCOUNTER — Ambulatory Visit (INDEPENDENT_AMBULATORY_CARE_PROVIDER_SITE_OTHER): Payer: Managed Care, Other (non HMO) | Admitting: Family Medicine

## 2019-05-25 VITALS — BP 138/80 | HR 68 | Temp 98.7°F | Wt 153.0 lb

## 2019-05-25 DIAGNOSIS — R030 Elevated blood-pressure reading, without diagnosis of hypertension: Secondary | ICD-10-CM

## 2019-05-25 DIAGNOSIS — R0602 Shortness of breath: Secondary | ICD-10-CM | POA: Insufficient documentation

## 2019-05-25 DIAGNOSIS — F17200 Nicotine dependence, unspecified, uncomplicated: Secondary | ICD-10-CM | POA: Diagnosis not present

## 2019-05-25 DIAGNOSIS — N529 Male erectile dysfunction, unspecified: Secondary | ICD-10-CM

## 2019-05-25 DIAGNOSIS — Z8709 Personal history of other diseases of the respiratory system: Secondary | ICD-10-CM

## 2019-05-25 MED ORDER — INCRUSE ELLIPTA 62.5 MCG/INH IN AEPB: 1.0000 | INHALATION_SPRAY | Freq: Every morning | RESPIRATORY_TRACT | 3 refills | Status: DC

## 2019-05-25 MED ORDER — TADALAFIL 10 MG PO TABS: 10.0000 mg | ORAL_TABLET | Freq: Every day | ORAL | 3 refills | Status: DC | PRN

## 2019-05-25 MED ORDER — ALBUTEROL SULFATE HFA 108 (90 BASE) MCG/ACT IN AERS: 2.0000 | INHALATION_SPRAY | Freq: Four times a day (QID) | RESPIRATORY_TRACT | 11 refills | Status: DC | PRN

## 2019-05-25 NOTE — Assessment & Plan Note (Addendum)
Patient endorses chronic history of asthma, although no records to indicate PFT's or treatment in the past. He has been controlling his symptoms with OTC epi inhaler. He is also a chronic current tobacco user, thus COPD is high on the differential. No wheezing appreciated on exam. No chest pain or LE edema is reassuring for cardiac etiology. No infectious symptoms to indicate acute infection. Patient was open to further evaluation and PFT's. Given that [facility] is not currently performing PFT's due to COVID, will refer him to pulmonologist. In the mean time, will provide rx for Albuterol and Incruse (on formulary). Most recent CT scan for screening of malignancy was WNL. Lengthy smoking cessation counseling provided. Patient did not demonstrate readiness to quit at this time. Return precautions discussed. Patient understood and agreed to plan. - Referral to pulmonologist for PFTs - Rx for Incruse and Albuterol - Continue smoking cessation counseling and goal setting.

## 2019-05-25 NOTE — Assessment & Plan Note (Signed)
Blood pressure slightly elevated today. No history of HTN. Patient noted feeling agitated at time of reading. Patient to follow up in 1 month for annual exam. Will recheck at that time.

## 2019-05-25 NOTE — Assessment & Plan Note (Addendum)
Patient did not demonstrate readiness to quit, however is aware of negative effects on health. Discussed importance of quitting at length. aAempted to create quit goal but patient was reluctant at this time. Will continue smoking cessation counseling at follow up visit. Can consider Wellbutrin vs Chantix vs Nicotine supplement at follow up. Can consider Wellbutrin vs Chantix vs Nicotine supplement at follow up.

## 2019-05-25 NOTE — Progress Notes (Signed)
Subjective:   Patient ID: Kevin Fitzgerald    DOB: 03/03/1961, 58 y.o. male   MRN: [id number]  Kevin Fitzgerald is a 58 y.o. male with a history of asthma here after experiencing an asthma attack.   Shortness of Breath: Patient notes he was diagnosed with asthma as a kid. He notes an "asthma attach" on Sunday when he was in the house and got really hot where he had trouble breathing with cough. He notes he often has trouble catching his breath. He notes he coughs a lot at night and more SOB prior to bed and when waking. He notes triggers are the heat and that he is a current tobacco smoker. He treats his current "asthma attacks" with OTC Primatene Mist epinephrine inhaler. This is how he has always treated his "asthma attacks". He has never been given albuterol or other inhalers. He has never has PFT's. Cough is productive and consists of clear/white sputum.  Denies any LE swelling, chest pain, palpitations, or fast heart rate. Denies any fevers or chills. He notes he has audible wheezing with these attacks. He notes attacks are happening a  more often - a couple of times a week. Worse in the summer when it is hot.    Elevated BP:  BP 138/80 today. No history of hypertension. No medications. Denies any chest pain, current SOB, headaches, or vision changes.   Review of Systems:  Per HPI.   PMFSH, medications and smoking status reviewed.  Objective:   BP 138/80   Pulse 68   Temp 98.7 F (37.1 C) (Oral)   Wt 153 lb (69.4 kg)   SpO2 98%   BMI 24.69 kg/m  Vitals and nursing note reviewed.  General: well nourished, well developed, in no acute distress with non-toxic appearance CV: regular rate and rhythm without murmurs, rubs, or gallops, no lower extremity edema, 2+ radial and pedal pulses bilaterally Lungs: clear to auscultation bilaterally with normal work of breathing on room air, no wheezing or crackles appreciated  Abdomen: soft, non-tender, non-distended,  normoactive bowel sounds  Skin: warm, dry Extremities: warm and well perfused  Assessment & Plan:   Erectile dysfunction Well controlled with Cialis. Refill provided today.  Shortness of breath Patient endorses chronic history of asthma, although no records to indicate PFT's or treatment in the past. He has been controlling his symptoms with OTC epi inhaler. He is also a chronic current tobacco user, thus COPD is high on the differential. No wheezing appreciated on exam. No chest pain or LE edema is reassuring for cardiac etiology. No infectious symptoms to indicate acute infection. Patient was open to further evaluation and PFT's. [facility], will refer him to pulmonologist. In the mean time, will provide rx for Albuterol and Incruse (on formulary). Most recent CT scan for screening of malignancy was WNL. Lengthy smoking cessation counseling provided. Patient did not demonstrate readiness to quit at this time. Return precautions discussed. Patient understood and agreed to plan. - Referral to pulmonologist for PFTs - Rx for Incruse and Albuterol - Continue smoking cessation counseling and goal setting.   Elevated blood pressure reading Blood pressure slightly elevated today. No history of HTN. Patient noted feeling agitated at time of reading. Patient to follow up in 1 month for annual exam. Will recheck at that time.   Tobacco use disorder Patient did not demonstrate readiness to quit, however is aware of negative effects on health. Discussed importance of  quitting at length. aAempted to create quit goal but patient was reluctant at this time. Will continue smoking cessation counseling at follow up visit. Can consider Wellbutrin vs Chantix vs Nicotine supplement at follow up. Can consider Wellbutrin vs Chantix vs Nicotine supplement at follow up.  Orders Placed This Encounter  Procedures  . Ambulatory referral to Pulmonology    Referral Priority:   Routine     Referral Type:   Consultation    Referral Reason:   Specialty Services Required    Requested Specialty:   Pulmonary Disease    Number of Visits Requested:   1   Meds ordered this encounter  Medications  . albuterol (VENTOLIN HFA) 108 (90 Base) MCG/ACT inhaler    Sig: Inhale 2 puffs into the lungs every 6 (six) hours as needed for wheezing or shortness of breath.    Dispense:  6.7 g    Refill:  11  . umeclidinium bromide (INCRUSE ELLIPTA) 62.5 MCG/INH AEPB    Sig: Inhale 1 puff into the lungs every morning.    Dispense:  30 each    Refill:  3  . tadalafil (CIALIS) 10 MG tablet    Sig: Take 1 tablet (10 mg total) by mouth daily as needed for erectile dysfunction.    Dispense:  30 tablet    Refill:  3    RTC in 1 month for annual exam, labs, and blood pressure recheck.  Mina Marble, DO PGY-2, [facility] 05/25/2019 8:43 PM

## 2019-05-25 NOTE — Assessment & Plan Note (Signed)
Well controlled with Cialis. Refill provided today.

## 2019-05-25 NOTE — Patient Instructions (Signed)
You for coming in today to see me.  It was really nice to meet you.  I do think you need to be evaluated by pulmonologist for further evaluation and pulmonary function tests.  I have sent in this referral. Please expect a call to schedule this appointment.  I have also sent in to inhalers.  One is called Incruse which she will need to take 1 puff every morning.  The other is called albuterol which you will take as needed for shortness of breath.    Please also be sure to schedule your annual physical at your earliest convenience in the next 1 to 2 months.  Thank you again and please do not hesitate to call if you have any new concerns.  Take care, Dr. Tarry Kos

## 2019-06-28 ENCOUNTER — Ambulatory Visit (INDEPENDENT_AMBULATORY_CARE_PROVIDER_SITE_OTHER): Payer: Managed Care, Other (non HMO) | Admitting: Family Medicine

## 2019-06-28 ENCOUNTER — Encounter: Payer: Self-pay | Admitting: Family Medicine

## 2019-06-28 ENCOUNTER — Other Ambulatory Visit: Payer: Self-pay

## 2019-06-28 VITALS — BP 110/69 | HR 77 | Temp 98.3°F | Wt 158.0 lb

## 2019-06-28 DIAGNOSIS — F17200 Nicotine dependence, unspecified, uncomplicated: Secondary | ICD-10-CM

## 2019-06-28 DIAGNOSIS — Z0000 Encounter for general adult medical examination without abnormal findings: Secondary | ICD-10-CM | POA: Diagnosis not present

## 2019-06-28 DIAGNOSIS — E7849 Other hyperlipidemia: Secondary | ICD-10-CM

## 2019-06-28 DIAGNOSIS — Z122 Encounter for screening for malignant neoplasm of respiratory organs: Secondary | ICD-10-CM

## 2019-06-28 DIAGNOSIS — F325 Major depressive disorder, single episode, in full remission: Secondary | ICD-10-CM

## 2019-06-28 DIAGNOSIS — N529 Male erectile dysfunction, unspecified: Secondary | ICD-10-CM

## 2019-06-28 DIAGNOSIS — Z1211 Encounter for screening for malignant neoplasm of colon: Secondary | ICD-10-CM

## 2019-06-28 MED ORDER — ATORVASTATIN CALCIUM 20 MG PO TABS: 20.0000 mg | ORAL_TABLET | Freq: Every day | ORAL | 3 refills | Status: DC

## 2019-06-28 MED ORDER — TADALAFIL 10 MG PO TABS: 10.0000 mg | ORAL_TABLET | Freq: Every day | ORAL | 3 refills | Status: DC | PRN

## 2019-06-28 MED ORDER — CHANTIX STARTING MONTH PAK 0.5 MG X 11 & 1 MG X 42 PO TABS: ORAL_TABLET | ORAL | 0 refills | Status: DC

## 2019-06-28 NOTE — Assessment & Plan Note (Signed)
Address during visit. He denies any current symptoms and is not interested in treatment or therapy at this time. Patient instructed to RTC if becomes symptomatic or desires treatment. Patient understood and agreed to plan

## 2019-06-28 NOTE — Assessment & Plan Note (Signed)
Doing well on Lipitor 20mg .  - lipid panel obtained today - continue current regimen

## 2019-06-28 NOTE — Progress Notes (Signed)
Subjective:   Patient ID: Kevin Fitzgerald    DOB: [birth year], 58 y.o. male   MRN: [id number]  Kevin Fitzgerald is a 58 y.o. male with a history of BPH s/p transurethral prostatectomy in 2014, ED, adjustment disorder/MDD, HLD, and tobacco use disorder here for annual exam.  HLD: Currently on Lipitor 20mg  QD. Denies any muscle aches or weakness or GI side effects.   History of elevated blood pressure: Elevated BP at last visit. BP today: 110/69. No history of HTN. No medications. Denies any chest pain, current SOB, headaches, or vision changes.   Tobacco use disorder: Patient currently smokes 1 PPD of any kind he can get. He has been smoking for 30-40 years, started smoking at 58 years old. Has tried the gum but no help. Denies any cough, worsening cough, or productive cough.  MDD: Patient has history of depression and adjustment disorder. He has never been treated before. He is currently not on any treatment. He is not interested in treatment. Denies any current thoughts of suicide, however he does note that he has had them in the past. He denies ever having any plan. He is not interested in any type of therapy.  Health Maintenance: Due for colonoscopy and flu vaccine. CT chest lung cancer screen given smoking history.  Review of Systems  Constitutional: Negative for appetite change, chills, fever and unexpected weight change.  HENT: Negative for congestion, ear pain, rhinorrhea, sinus pain, sore throat, trouble swallowing and voice change.   Eyes: Negative for pain and visual disturbance.  Respiratory: Positive for wheezing (only occasionally with asthma flare). Negative for cough and chest tightness.   Cardiovascular: Negative for chest pain, palpitations and leg swelling.  Gastrointestinal: Negative for abdominal pain, blood in stool, constipation, diarrhea, nausea and vomiting.  Endocrine: Negative for cold intolerance, heat intolerance, polydipsia, polyphagia and polyuria.   Genitourinary: Negative for difficulty urinating, discharge, dysuria, genital sores, hematuria, penile pain, scrotal swelling and testicular pain.  Musculoskeletal: Positive for arthralgias (chronic). Negative for back pain and joint swelling.  Neurological: Negative for weakness, numbness and headaches.  Psychiatric/Behavioral: Negative for hallucinations and suicidal ideas.   Review of Systems:  Per HPI.   PMFSH, medications and smoking status reviewed.  Objective:   BP 110/69   Pulse 77   Temp 98.3 F (36.8 C) (Oral)   Wt 158 lb (71.7 kg)   SpO2 99%   BMI 25.50 kg/m  Vitals and nursing note reviewed.  General: well nourished, well developed, in no acute distress with non-toxic appearance, sitting comfortably in exam chair HEENT: normocephalic, atraumatic, moist mucous membranes, oropharynx WNL without exudate or erythema, TM's normal b/l Neck: supple, non-tender without lymphadenopathy CV: regular rate and rhythm without murmurs, rubs, or gallops, no lower extremity edema, 2+ radial and pedal pulses b/l Lungs: clear to auscultation bilaterally with normal work of breathing Abdomen: soft, non-tender, non-distended, normoactive bowel sounds Skin: warm, dry Extremities: warm and well perfuse Neuro: Alert and oriented, speech normal  Assessment & Plan:   Hyperlipemia Doing well on Lipitor 20mg .  - lipid panel obtained today - continue current regimen  Tobacco use disorder Patient endorses readiness to quit smoking. Primary obstacle is the nicotine craving. >50% of 25 minute visit spent on smoking cessation including discussing quit date, nicotine replacement vs Chantix. Patient opted to try Chantix. He is interested in the patch if unable to obtain Chantix.  - start Chantix starter pack. Instructions for use where discussed in detail  - 1800QUITNOW information provided -  RTC prn in 3-4 months to discuss progress - low dose CT ordered today   MDD (major depressive  disorder) Address during visit. He denies any current symptoms and is not interested in treatment or therapy at this time. Patient instructed to RTC if becomes symptomatic or desires treatment. Patient understood and agreed to plan  Erectile dysfunction Well controlled on Cialis. Refill provided today.  Health Maintenance: - Discussed colonoscopy vs Cologuard. Patient opted for Cologuard. - low dose CT scan ordered - Declined Flu vaccine   Orders Placed This Encounter  Procedures  . CT CHEST LUNG CA SCREEN LOW DOSE W/O CM    Standing Status:   Future    Standing Expiration Date:   08/27/2020    Order Specific Question:   Reason for Exam (SYMPTOM  OR DIAGNOSIS REQUIRED)    Answer:   lung cancer screen    Order Specific Question:   Preferred Imaging Location?    Answer:   [facility]    Order Specific Question:   Radiology Contrast Protocol - do NOT remove file path    Answer:   [email or web address]  . Basic Metabolic Panel  . Lipid Panel  . Cologuard   Meds ordered this encounter  Medications  . atorvastatin (LIPITOR) 20 MG tablet    Sig: Take 1 tablet (20 mg total) by mouth daily.    Dispense:  90 tablet    Refill:  3  . tadalafil (CIALIS) 10 MG tablet    Sig: Take 1 tablet (10 mg total) by mouth daily as needed for erectile dysfunction.    Dispense:  30 tablet    Refill:  3  . varenicline (CHANTIX STARTING MONTH PAK) 0.5 MG X 11 & 1 MG X 42 tablet    Sig: Take one 0.5 mg tablet by mouth QD for 3 days, then increase to one 0.5 mg tablet BID for 4 days, then increase to one 1 mg tablet BID.    Dispense:  53 tablet    Refill:  0    Mina Marble, DO PGY-2, [facility] 06/28/2019 9:48 PM

## 2019-06-28 NOTE — Patient Instructions (Addendum)
Thank you for ocming in to see me today!  I am so happy you want to quit smoking! I have sent in the Chantix starter pack. Please be sure to take with food and full glass of water.   For your quit date, you can either: Choose a fixed quit date (ie, start Chantix, then quit on day 8) or a flexible quit date (ie, start Chantix, then quit between days 8 to 35). Alternatively, a gradual quit date (ie, start Chantix and reduce smoking 50% by week 4, reduce an additional 50% by week 8, and continue reducing with a goal of complete abstinence by week 12) is acceptable.   I also recommend 1-800QUITNOW for more resources.   Please let me know if you have issues with insurance and we can do something else.   I have obtained some labs and will let you know if anything is abnormal. I have placed in a referral for CT chest lung cancer screen. I have sent you home with Cologuard which screens for colon cancer. Please complete this and mail as instructed.   Please let me know if there is anything else I can do for you, otherwise please return in 1 year for annual exam.   Take care, Dr. Tarry Kos

## 2019-06-28 NOTE — Assessment & Plan Note (Signed)
Well controlled on Cialis. Refill provided today.

## 2019-06-28 NOTE — Assessment & Plan Note (Signed)
Patient endorses readiness to quit smoking. Primary obstacle is the nicotine craving. >50% of 25 minute visit spent on smoking cessation including discussing quit date, nicotine replacement vs Chantix. Patient opted to try Chantix. He is interested in the patch if unable to obtain Chantix.  - start Chantix starter pack. Instructions for use where discussed in detail  - 1800QUITNOW information provided - RTC prn in 3-4 months to discuss progress - low dose CT ordered today

## 2019-06-29 LAB — BASIC METABOLIC PANEL
BUN/Creatinine Ratio: 21 — ABNORMAL HIGH (ref 9–20)
BUN: 18 mg/dL (ref 6–24)
CO2: 27 mmol/L (ref 20–29)
Calcium: 10.1 mg/dL (ref 8.7–10.2)
Chloride: 103 mmol/L (ref 96–106)
Creatinine, Ser: 0.85 mg/dL (ref 0.76–1.27)
GFR calc Af Amer: 113 mL/min/{1.73_m2} (ref >59)
GFR calc non Af Amer: 97 mL/min/{1.73_m2} (ref >59)
Glucose: 50 mg/dL — ABNORMAL LOW (ref 65–99)
Potassium: 4.8 mmol/L (ref 3.5–5.2)
Sodium: 142 mmol/L (ref 134–144)

## 2019-06-29 LAB — LIPID PANEL
Chol/HDL Ratio: 2.7 ratio (ref 0.0–5.0)
Cholesterol, Total: 202 mg/dL — ABNORMAL HIGH (ref 100–199)
HDL: 76 mg/dL (ref >39)
LDL Calculated: 109 mg/dL — ABNORMAL HIGH (ref 0–99)
Triglycerides: 84 mg/dL (ref 0–149)
VLDL Cholesterol Cal: 17 mg/dL (ref 5–40)

## 2019-07-01 ENCOUNTER — Encounter: Payer: Self-pay | Admitting: Family Medicine

## 2019-07-08 ENCOUNTER — Telehealth: Payer: Self-pay

## 2019-07-08 NOTE — Telephone Encounter (Signed)
-----   Message from Danna Hefty, [place] sent at 07/07/2019 12:39 PM EDT ----- Regarding: Cologuard Would you mind contacting patient to remind him to complete his Cologuard test? Thank you!

## 2019-07-08 NOTE — Telephone Encounter (Signed)
Called patient and left message with his wife [name] reminding him to complete his Cologuard test.  .[name], CMA

## 2019-07-09 ENCOUNTER — Ambulatory Visit (HOSPITAL_COMMUNITY): Payer: Managed Care, Other (non HMO)

## 2019-07-19 ENCOUNTER — Encounter: Payer: Self-pay | Admitting: Gastroenterology

## 2019-07-22 ENCOUNTER — Telehealth: Payer: Self-pay | Admitting: Family Medicine

## 2019-07-22 NOTE — Telephone Encounter (Signed)
Peer-to-peer completed for Low dose CT scan for lung cancer screening. This has been approved. Authorization code: [id number].   Dr. Mina Marble, [place], PGY2

## 2019-08-05 ENCOUNTER — Ambulatory Visit (HOSPITAL_COMMUNITY)
Admission: RE | Admit: 2019-08-05 | Discharge: 2019-08-05 | Disposition: A | Discharge status: Home / Self Care | Payer: BC Managed Care – PPO | Source: Ambulatory Visit | Attending: Family Medicine | Admitting: Family Medicine

## 2019-08-05 ENCOUNTER — Other Ambulatory Visit: Payer: Self-pay

## 2019-08-05 DIAGNOSIS — Z122 Encounter for screening for malignant neoplasm of respiratory organs: Secondary | ICD-10-CM | POA: Insufficient documentation

## 2019-08-06 ENCOUNTER — Telehealth: Payer: Self-pay

## 2019-08-06 NOTE — Progress Notes (Signed)
Patient called and informed of CT results. No further work up at this time. Plan for annual lung cancer screen as scheduled.

## 2019-08-06 NOTE — Telephone Encounter (Signed)
Patient called and informed of negative results.

## 2019-08-06 NOTE — Telephone Encounter (Signed)
Patient LVM on nurse line requesting results of CT. Please advise.

## 2019-08-12 ENCOUNTER — Encounter: Payer: Managed Care, Other (non HMO) | Admitting: Gastroenterology

## 2019-09-19 DIAGNOSIS — T50904A Poisoning by unspecified drugs, medicaments and biological substances, undetermined, initial encounter: Secondary | ICD-10-CM | POA: Diagnosis not present

## 2019-09-19 DIAGNOSIS — R0789 Other chest pain: Secondary | ICD-10-CM | POA: Diagnosis not present

## 2019-09-19 DIAGNOSIS — R079 Chest pain, unspecified: Secondary | ICD-10-CM | POA: Diagnosis not present

## 2019-10-18 ENCOUNTER — Telehealth: Payer: Self-pay

## 2019-10-18 NOTE — Progress Notes (Signed)
Spoke to patient to remind him of his Cologuard. Patient promises he will complete the screening.

## 2019-10-18 NOTE — Telephone Encounter (Signed)
Called patient and he is not available.  Spoke to his wife [name] and she states that patient has been called by [employer] on several occassions concerning completing and sending back in his Cologuard specimen.  She will pass on to patient that the kit does have an expiration date of 07/21/2020.  [name], [place]

## 2020-01-28 ENCOUNTER — Other Ambulatory Visit: Payer: Self-pay

## 2020-01-28 DIAGNOSIS — Z8709 Personal history of other diseases of the respiratory system: Secondary | ICD-10-CM

## 2020-01-28 DIAGNOSIS — F17200 Nicotine dependence, unspecified, uncomplicated: Secondary | ICD-10-CM

## 2020-01-28 DIAGNOSIS — R0602 Shortness of breath: Secondary | ICD-10-CM

## 2020-01-28 NOTE — Telephone Encounter (Signed)
Patient LVM on nurse line requesting refills on his asthma medication.

## 2020-01-30 MED ORDER — INCRUSE ELLIPTA 62.5 MCG/INH IN AEPB: 1.0000 | INHALATION_SPRAY | Freq: Every morning | RESPIRATORY_TRACT | 3 refills | Status: DC

## 2020-01-30 MED ORDER — ALBUTEROL SULFATE HFA 108 (90 BASE) MCG/ACT IN AERS: 2.0000 | INHALATION_SPRAY | Freq: Four times a day (QID) | RESPIRATORY_TRACT | 11 refills | Status: DC | PRN

## 2020-02-14 ENCOUNTER — Telehealth (INDEPENDENT_AMBULATORY_CARE_PROVIDER_SITE_OTHER): Payer: BC Managed Care – PPO | Admitting: Family Medicine

## 2020-02-14 ENCOUNTER — Encounter: Payer: Self-pay | Admitting: Family Medicine

## 2020-02-14 DIAGNOSIS — J441 Chronic obstructive pulmonary disease with (acute) exacerbation: Secondary | ICD-10-CM | POA: Diagnosis not present

## 2020-02-14 DIAGNOSIS — J45901 Unspecified asthma with (acute) exacerbation: Secondary | ICD-10-CM | POA: Diagnosis not present

## 2020-02-14 MED ORDER — PREDNISONE 20 MG PO TABS: 40.0000 mg | ORAL_TABLET | Freq: Every day | ORAL | 0 refills | Status: AC

## 2020-02-14 NOTE — Progress Notes (Signed)
[name] Telemedicine Visit  Patient consented to have virtual visit and was identified by name and date of birth. Method of visit: Video  Encounter participants: Patient: Kevin Fitzgerald - located at home Provider: Shirlean Mylar - located at [facility]  Chief Complaint: cough  HPI: Patient has had a persistent cough present for 1 month. It is worst at night with some wheezing symptoms, occasionally happens during the day. He does not cough up any mucus or phlegm, no runny nose, sinus pain/congestion, fevers, ear/eye pain or discharge, or sore throat. He does not have chest pain, but does endorse occasional shortness of breath with coughing/wheezing. This does not prevent him from going about his usual tasks, he is able to speak in full sentences. Patient is a current smoker, 1/2 ppd x 40 years. Tried to quit multiple times, most recently with chantix. Annual chest CT screen for lung cancer WNL. He uses albuterol and incruse ellipta daily.  ROS: per HPI  Pertinent PMHx: Asthma, Smoking  Exam:  There were no vitals taken for this visit.  Respiratory: able to speak in full sentences, no coughing witnessed  Assessment/Plan: Kevin Fitzgerald is a 59 yo man with likely Asthma/COPD exacerbation  Acute exacerbation of COPD with asthma (HCC) Patient presents with worsening persistent cough with 20 pack year smoking history, using incruse ellipta and albuterol daily. No other infections symptoms. Suspect patient likely has COPD in addition to asthma, but has never had PFTs. Referred to pulmonology by PCP last year, but was not scheduled. CT screen for lung cancer WNL.  - Current every day smoker, counseled on cessation and recommend patient schedule a follow up with PCP to discuss medications for cessation - Recommend COVID-19 test, unlikely due to no other symptoms and length of cough with other possible diagnosis higher on differential, but if negative than can see patient in  office. Gave directions to schedule appointment online at [facility] - Prednisone 40 mg qd x 5 days for exacerbation - If SOB worsens to difficulty breathing, recommend patient go to ED - Referral for pulmonology placed again, patient needs PFTs to help address controller medication choice   Time spent during visit with patient: 12 minutes

## 2020-02-14 NOTE — Assessment & Plan Note (Addendum)
Patient presents with worsening persistent cough with 20 pack year smoking history, using incruse ellipta and albuterol daily. No other infections symptoms. Suspect patient likely has COPD in addition to asthma, but has never had PFTs. Referred to pulmonology by PCP last year, but was not scheduled. CT screen for lung cancer WNL.  - Current every day smoker, counseled on cessation and recommend patient schedule a follow up with PCP to discuss medications for cessation - Recommend COVID-19 test, unlikely due to no other symptoms and length of cough with other possible diagnosis higher on differential, but if negative then can see patient in office. Gave directions to schedule appointment online at [facility] - Prednisone 40 mg qd x 5 days for exacerbation - If SOB worsens to difficulty breathing, recommend patient go to ED - Referral for pulmonology placed again, patient needs PFTs to help address controller medication choice

## 2020-03-04 ENCOUNTER — Emergency Department (HOSPITAL_COMMUNITY)
Admission: EM | Admit: 2020-03-04 | Discharge: 2020-03-04 | Disposition: A | Discharge status: Home / Self Care | Payer: BC Managed Care – PPO | Attending: Emergency Medicine | Admitting: Emergency Medicine

## 2020-03-04 ENCOUNTER — Encounter (HOSPITAL_COMMUNITY): Payer: Self-pay | Admitting: Emergency Medicine

## 2020-03-04 ENCOUNTER — Emergency Department (HOSPITAL_COMMUNITY): Payer: BC Managed Care – PPO

## 2020-03-04 ENCOUNTER — Other Ambulatory Visit: Payer: Self-pay

## 2020-03-04 DIAGNOSIS — R0602 Shortness of breath: Secondary | ICD-10-CM | POA: Diagnosis not present

## 2020-03-04 DIAGNOSIS — R062 Wheezing: Secondary | ICD-10-CM | POA: Diagnosis not present

## 2020-03-04 DIAGNOSIS — I10 Essential (primary) hypertension: Secondary | ICD-10-CM | POA: Diagnosis not present

## 2020-03-04 DIAGNOSIS — R0902 Hypoxemia: Secondary | ICD-10-CM | POA: Diagnosis not present

## 2020-03-04 DIAGNOSIS — J45909 Unspecified asthma, uncomplicated: Secondary | ICD-10-CM | POA: Insufficient documentation

## 2020-03-04 DIAGNOSIS — Z5321 Procedure and treatment not carried out due to patient leaving prior to being seen by health care provider: Secondary | ICD-10-CM | POA: Insufficient documentation

## 2020-03-04 LAB — CBC WITH DIFFERENTIAL/PLATELET
Abs Immature Granulocytes: 0.02 10*3/uL (ref 0.00–0.07)
Basophils Absolute: 0.1 10*3/uL (ref 0.0–0.1)
Basophils Relative: 1 %
Eosinophils Absolute: 0.9 10*3/uL — ABNORMAL HIGH (ref 0.0–0.5)
Eosinophils Relative: 8 %
HCT: 44.6 % (ref 39.0–52.0)
Hemoglobin: 14.1 g/dL (ref 13.0–17.0)
Immature Granulocytes: 0 %
Lymphocytes Relative: 38 %
Lymphs Abs: 4.4 10*3/uL — ABNORMAL HIGH (ref 0.7–4.0)
MCH: 29.1 pg (ref 26.0–34.0)
MCHC: 31.6 g/dL (ref 30.0–36.0)
MCV: 92 fL (ref 80.0–100.0)
Monocytes Absolute: 0.9 10*3/uL (ref 0.1–1.0)
Monocytes Relative: 7 %
Neutro Abs: 5.2 10*3/uL (ref 1.7–7.7)
Neutrophils Relative %: 46 %
Platelets: 168 10*3/uL (ref 150–400)
RBC: 4.85 MIL/uL (ref 4.22–5.81)
RDW: 12.4 % (ref 11.5–15.5)
WBC: 11.6 10*3/uL — ABNORMAL HIGH (ref 4.0–10.5)
nRBC: 0 % (ref 0.0–0.2)

## 2020-03-04 LAB — BASIC METABOLIC PANEL
Anion gap: 7 (ref 5–15)
BUN: 17 mg/dL (ref 6–20)
CO2: 30 mmol/L (ref 22–32)
Calcium: 9.6 mg/dL (ref 8.9–10.3)
Chloride: 104 mmol/L (ref 98–111)
Creatinine, Ser: 0.92 mg/dL (ref 0.61–1.24)
GFR calc Af Amer: >60 mL/min (ref >60)
GFR calc non Af Amer: >60 mL/min (ref >60)
Glucose, Bld: 84 mg/dL (ref 70–99)
Potassium: 4.2 mmol/L (ref 3.5–5.1)
Sodium: 141 mmol/L (ref 135–145)

## 2020-03-04 NOTE — ED Triage Notes (Signed)
Patient arrived with EMS from home reports asthma attack this evening  with wheezing , he received Solumedrol 125 mg IV and 1 DuoNeb treatment by EMS prior to arrival with relief . No cough or fever .

## 2020-03-04 NOTE — ED Notes (Signed)
Patient advised staff that he is leaving. 

## 2020-03-10 ENCOUNTER — Telehealth: Payer: Self-pay

## 2020-03-10 NOTE — Telephone Encounter (Signed)
Called patient and he was not at home.  His wife remembers me calling before and states that patient is probably not going to complete the test at all.  She states that she will remind him though.  Glennie Hawk, CMA

## 2020-03-10 NOTE — Telephone Encounter (Signed)
-----   Message from Jennette Bill, CMA sent at 03/10/2020  9:22 AM EDT ----- Regarding: cologuard Cologuard due

## 2020-04-14 ENCOUNTER — Ambulatory Visit (HOSPITAL_COMMUNITY)
Admission: EM | Admit: 2020-04-14 | Discharge: 2020-04-14 | Disposition: A | Discharge status: Home / Self Care | Payer: BC Managed Care – PPO | Attending: Family Medicine | Admitting: Family Medicine

## 2020-04-14 ENCOUNTER — Other Ambulatory Visit: Payer: Self-pay

## 2020-04-14 ENCOUNTER — Encounter (HOSPITAL_COMMUNITY): Payer: Self-pay

## 2020-04-14 DIAGNOSIS — M545 Low back pain: Secondary | ICD-10-CM | POA: Diagnosis not present

## 2020-04-14 DIAGNOSIS — M5459 Other low back pain: Secondary | ICD-10-CM

## 2020-04-14 MED ORDER — HYDROCODONE-ACETAMINOPHEN 7.5-325 MG PO TABS: 1.0000 | ORAL_TABLET | ORAL | 0 refills | Status: DC | PRN

## 2020-04-14 MED ORDER — IBUPROFEN 800 MG PO TABS
800.0000 mg | ORAL_TABLET | Freq: Three times a day (TID) | ORAL | 0 refills | Status: DC
Start: 2020-04-14 — End: 2021-01-18

## 2020-04-14 NOTE — Discharge Instructions (Signed)
Take ibuprofen 3 times a day with food This will help with the pain and inflammation Take the hydrocodone if needed for severe pain Be careful taking hydrocodone and driving or operating machinery Use ice or heat to area Avoid heavy lifting until back pain improves Return as needed

## 2020-04-14 NOTE — ED Provider Notes (Signed)
[facility]    CSN: [id number] Arrival date & time: 04/14/20  7342      History   Chief Complaint Chief Complaint  Patient presents with  . Back Injury    HPI Kevin Fitzgerald is a 59 y.o. male.   HPI  Patient states that he woke up with back pain about a week ago.  It is in the low back.  Points to the left lower lumbar region.  Pain radiates into his left buttock. He has continued working full duty.  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  He states he lifted a heavy [place] at work, and this increased his back pain. He has not taken any medication. He states he wants a "pain pill" so he can go back to work.  He is cautioned against working on pain pills, and is advised not to do heavy lifting for a few days He states he does not have any problems with his back that he knows of, no history of back injury or disc disease Generally in good health, per patient.  Is advised to quit smoking  Past Medical History:  Diagnosis Date  . Arthritis    knee  . Asthma    as child  . Hand fracture, right   . Hypercholesteremia     Patient Active Problem List   Diagnosis Date Noted  . Acute exacerbation of COPD with asthma (HCC) 02/14/2020  . Hyperlipemia 07/02/2018  . Erectile dysfunction 05/16/2017  . Tobacco use disorder 05/16/2017  . MDD (major depressive disorder) 09/12/2015    Past Surgical History:  Procedure Laterality Date  . FINGER ARTHRODESIS Right 12/13/2015   Procedure: RIGHT 4TH AND 5TH CARPOMETACARPAL ARTHRODESIS;  Surgeon: Tarry Kos, MD;  Location: [facility];  Service: Orthopedics;  Laterality: Right;  . TRANSURETHRAL RESECTION OF PROSTATE N/A 09/22/2013   Procedure: TRANSURETHRAL RESECTION OF THE PROSTATE WITH GYRUS INSTRUMENTS;  Surgeon: Sebastian Ache, MD;  Location: [facility];  Service: Urology;  Laterality: N/A;       Home Medications    Prior to Admission medications   Medication Sig Start Date End Date Taking? Authorizing Provider   EPINEPHrine (PRIMATENE MIST) 0.125 MG/ACT AERO Inhale into the lungs.   Yes [provider]  albuterol (VENTOLIN HFA) 108 (90 Base) MCG/ACT inhaler Inhale 2 puffs into the lungs every 6 (six) hours as needed for wheezing or shortness of breath. 01/30/20   Mullis, Kiersten P, DO  atorvastatin (LIPITOR) 20 MG tablet Take 1 tablet (20 mg total) by mouth daily. 06/28/19   Mullis, Kiersten P, DO  HYDROcodone-acetaminophen (NORCO) 7.5-325 MG tablet Take 1 tablet by mouth every 4 (four) hours as needed. 04/14/20   Eustace Moore, MD  ibuprofen (ADVIL) 800 MG tablet Take 1 tablet (800 mg total) by mouth 3 (three) times daily. 04/14/20   Eustace Moore, MD  tadalafil (CIALIS) 10 MG tablet Take 1 tablet (10 mg total) by mouth daily as needed for erectile dysfunction. 06/28/19   Mullis, Kiersten P, DO  umeclidinium bromide (INCRUSE ELLIPTA) 62.5 MCG/INH AEPB Inhale 1 puff into the lungs every morning. 01/30/20   Mullis, Dara Lords, DO    Family History Family History  Problem Relation Age of Onset  . Cancer Mother   . Asthma Mother   . Stroke Mother   . Diabetes Mother   . Heart attack Father   . Diabetes Brother   . Asthma Brother   . Birth defects Brother   .  Heart disease Brother     Social History Social History   Tobacco Use  . Smoking status: Current Every Day Smoker    Packs/day: 1.00    Years: 35.00    Pack years: 35.00    Types: Cigarettes  . Smokeless tobacco: Never Used  Vaping Use  . Vaping Use: Never used  Substance Use Topics  . Alcohol use: Yes    Comment: occ  . Drug use: No     Allergies   Patient has no known allergies.   Review of Systems Review of Systems  Musculoskeletal: Positive for back pain. Negative for gait problem.  Neurological: Negative for weakness and numbness.    Physical Exam Triage Vital Signs ED Triage Vitals [04/14/20 0913]  Enc Vitals Group     BP 127/85     Pulse Rate 67     Resp 16     Temp 98.2 F (36.8 C)      Temp Source Oral     SpO2 100 %     Weight 165 lb (74.8 kg)     Height 5\' 8"  (1.727 m)     Head Circumference      Peak Flow      Pain Score 10     Pain Loc      Pain Edu?      Excl. in [place]?    No data found.  Updated Vital Signs BP 127/85   Pulse 67   Temp 98.2 F (36.8 C) (Oral)   Resp 16   Ht 5\' 8"  (1.727 m)   Wt 74.8 kg   SpO2 100%   BMI 25.09 kg/m      Physical Exam Constitutional:      General: He is not in acute distress.    Appearance: He is well-developed and normal weight.     Comments: Mildly stiff posture and movements  HENT:     Head: Normocephalic and atraumatic.     Mouth/Throat:     Comments: Mask is in place Eyes:     Conjunctiva/sclera: Conjunctivae normal.     Pupils: Pupils are equal, round, and reactive to light.  Cardiovascular:     Rate and Rhythm: Normal rate.  Pulmonary:     Effort: Pulmonary effort is normal. No respiratory distress.  Abdominal:     General: There is no distension.     Palpations: Abdomen is soft.  Musculoskeletal:        General: Normal range of motion.     Cervical back: Normal range of motion.     Comments: Tender in the  lower back at the L5-S1 junction, to the left of the spine.  Mild tenderness to left SI region.  Mild increased tone in the left lumbar column of muscles.  Slow but full range of motion.  Strength sensation range of motion reflexes are normal in lower extremities.  Straight leg raise on the left at full leg extension causes increased pain in the low back  Skin:    General: Skin is warm and dry.  Neurological:     General: No focal deficit present.     Mental Status: He is alert.     Motor: No weakness.     Coordination: Coordination normal.     Gait: Gait normal.     Deep Tendon Reflexes: Reflexes normal.  Psychiatric:        Mood and Affect: Mood normal.        Behavior: Behavior normal.  UC Treatments / Results  Labs (all labs ordered are listed, but only abnormal results are  displayed) Labs Reviewed - No data to display  EKG   Radiology No results found.  Procedures Procedures (including critical care time)  Medications Ordered in UC Medications - No data to display  Initial Impression / Assessment and Plan / UC Course  I have reviewed the triage vital signs and the nursing notes.  Pertinent labs & imaging results that were available during my care of the patient were reviewed by me and considered in my medical decision making (see chart for details).     Reviewed activity limits.  Ice or heat to area.  Gentle range of motion.  Ibuprofen for pain.  Hydrocodone for severe pain.  Return as needed Final Clinical Impressions(s) / UC Diagnoses   Final diagnoses:  Mechanical low back pain     Discharge Instructions     Take ibuprofen 3 times a day with food This will help with the pain and inflammation Take the hydrocodone if needed for severe pain Be careful taking hydrocodone and driving or operating machinery Use ice or heat to area Avoid heavy lifting until back pain improves Return as needed   ED Prescriptions    Medication Sig Dispense Auth. Provider   HYDROcodone-acetaminophen (NORCO) 7.5-325 MG tablet Take 1 tablet by mouth every 4 (four) hours as needed. 10 tablet Eustace Moore, MD   ibuprofen (ADVIL) 800 MG tablet Take 1 tablet (800 mg total) by mouth 3 (three) times daily. 21 tablet Eustace Moore, MD     I have reviewed the PDMP during this encounter.   Eustace Moore, MD 04/14/20 1026

## 2020-04-14 NOTE — ED Triage Notes (Signed)
Pt c/o 10/10 sharp pain in mid lower back upon waking last week. Pt states he also lifted a big boulder at work the day after his back started hurting. Pt states has slight numbness in mid lower back. Pt was able to walk to exam room.

## 2020-05-25 ENCOUNTER — Telehealth: Payer: Self-pay

## 2020-05-25 NOTE — Telephone Encounter (Signed)
-----  Message from Danna Hefty, DO sent at 05/22/2020  8:25 AM EDT ----- Regarding: FW: cologuard Please notify patient of the below. Thank you. ----- Message ----- From: [place] Pink, CMA Sent: 05/19/2020  10:00 AM EDT To: Archie Endo Mullis, DO, [facility] Subject: cologuard                                      Cologuard due, please contact patient to collect and return kit.

## 2020-05-25 NOTE — Telephone Encounter (Signed)
Called patient and spoke to his wife who states that she was unaware if her husband had returned the Cologuard.  Patient's wife will call have him call our office to let us know.  Glennie Hawk, CMA

## 2020-06-28 ENCOUNTER — Ambulatory Visit (INDEPENDENT_AMBULATORY_CARE_PROVIDER_SITE_OTHER): Payer: BC Managed Care – PPO | Admitting: Student in an Organized Health Care Education/Training Program

## 2020-06-28 ENCOUNTER — Other Ambulatory Visit: Payer: Self-pay

## 2020-06-28 VITALS — BP 122/74 | HR 73 | Wt 152.2 lb

## 2020-06-28 DIAGNOSIS — R0602 Shortness of breath: Secondary | ICD-10-CM

## 2020-06-28 DIAGNOSIS — E7849 Other hyperlipidemia: Secondary | ICD-10-CM

## 2020-06-28 DIAGNOSIS — J45909 Unspecified asthma, uncomplicated: Secondary | ICD-10-CM | POA: Insufficient documentation

## 2020-06-28 DIAGNOSIS — Z8709 Personal history of other diseases of the respiratory system: Secondary | ICD-10-CM

## 2020-06-28 DIAGNOSIS — N529 Male erectile dysfunction, unspecified: Secondary | ICD-10-CM

## 2020-06-28 DIAGNOSIS — F17200 Nicotine dependence, unspecified, uncomplicated: Secondary | ICD-10-CM | POA: Diagnosis not present

## 2020-06-28 DIAGNOSIS — J4520 Mild intermittent asthma, uncomplicated: Secondary | ICD-10-CM

## 2020-06-28 DIAGNOSIS — Z0000 Encounter for general adult medical examination without abnormal findings: Secondary | ICD-10-CM | POA: Diagnosis not present

## 2020-06-28 MED ORDER — INCRUSE ELLIPTA 62.5 MCG/INH IN AEPB: 1.0000 | INHALATION_SPRAY | Freq: Every morning | RESPIRATORY_TRACT | 0 refills | Status: DC

## 2020-06-28 MED ORDER — ALBUTEROL SULFATE HFA 108 (90 BASE) MCG/ACT IN AERS: 2.0000 | INHALATION_SPRAY | Freq: Four times a day (QID) | RESPIRATORY_TRACT | 5 refills | Status: DC | PRN

## 2020-06-28 MED ORDER — TADALAFIL 10 MG PO TABS: 10.0000 mg | ORAL_TABLET | Freq: Every day | ORAL | 3 refills | Status: DC | PRN

## 2020-06-28 NOTE — Assessment & Plan Note (Signed)
Refilled tadalafil 

## 2020-06-28 NOTE — Progress Notes (Signed)
    SUBJECTIVE:   CHIEF COMPLAINT / HPI: physical for work  [name] presents today to have mandatory form filled out for his work.  The form requests a physician signature that the physical was completed today but there is no space to provide specific information about the physical or the patient's medical conditions. Patient endorses feeling well overall other than some mild chronic knee pain which does not prevent him from completing ADLs or his work tasks.  He feels that he is capable of completing all of his regular work duties in his current state and has no concerns at this time. Patient has never completed a colonoscopy and declines a referral or Cologuard or any other testing for colon cancer. Patient is a current 1 pack/day smoker and declines any assistance in cessation at this time.  He is compliant with his COPD medications and needs refills of the Incruse and albuterol.  He uses the albuterol inhaler approximately once per week.  PERTINENT  PMH / PSH: HLD, MDD, arthritis, tobacco use disorder, COPD  OBJECTIVE:   BP 122/74   Pulse 73   Wt 152 lb 3.2 oz (69 kg)   SpO2 99%   BMI 23.14 kg/m   General: NAD, pleasant, able to participate in exam HEENT: PERRL, MMM, negative lymphadenitis, thyromegaly Cardiac: RRR, normal heart sounds, no murmurs. 2+ radial and PT pulses bilaterally Respiratory: CTAB, normal effort, No wheezes, rales or rhonchi Abdomen: soft, nontender, nondistended, no hepatic or splenomegaly, +BS Extremities: no edema or cyanosis. WWP. MSK: Analgesic gait, good range of motion in shoulders, able to painlessly get up and down from exam table and ambulate in clinic Skin: warm and dry, no rashes noted Neuro: alert and oriented x4, no focal deficits Psych: Normal affect and mood  ASSESSMENT/PLAN:   Physical exam, routine Normal exam.  Patient without concerns today. Completed form for patient's work requirements -Obtaining blood work today including  TSH, CBC, CMP, lipid panel  Asthma Refilled albuterol and Incruse today No wheezing heard on exam Counseled patient on tobacco cessation and provided with handout  Erectile dysfunction Refilled tadalafil     Kevin Bock, DO [facility]

## 2020-06-28 NOTE — Patient Instructions (Signed)
It was a pleasure to see you today!  To summarize our discussion for this visit:  We completed a physical for your work today. You did not have any concerns and described being able to complete all your duties. Your exam was normal  Please consider quitting smoking and getting a colon cancer screening  We will check some blood work today  I refilled your requested medications  Some additional health maintenance measures we should update are: Health Maintenance Due  Topic Date Due  . COVID-19 Vaccine (1) Never done  . COLONOSCOPY  Never done  . INFLUENZA VACCINE  06/04/2020  .    Call the clinic at [phone] if your symptoms worsen or you have any concerns.   Thank you for allowing me to take part in your care,  Dr. Jamelle Rushing   Coping with Quitting Smoking  Quitting smoking is a physical and mental challenge. You will face cravings, withdrawal symptoms, and temptation. Before quitting, work with your health care provider to make a plan that can help you cope. Preparation can help you quit and keep you from giving in. How can I cope with cravings? Cravings usually last for 5-10 minutes. If you get through it, the craving will pass. Consider taking the following actions to help you cope with cravings:  Keep your mouth busy: ? Chew sugar-free gum. ? Suck on hard candies or a straw. ? Brush your teeth.  Keep your hands and body busy: ? Immediately change to a different activity when you feel a craving. ? Squeeze or play with a ball. ? Do an activity or a hobby, like making bead jewelry, practicing needlepoint, or working with wood. ? Mix up your normal routine. ? Take a short exercise break. Go for a quick walk or run up and down stairs. ? Spend time in public places where smoking is not allowed.  Focus on doing something kind or helpful for someone else.  Call a friend or family member to talk during a craving.  Join a support group.  Call a quit line, such  as 1-800-QUIT-NOW.  Talk with your health care provider about medicines that might help you cope with cravings and make quitting easier for you. How can I deal with withdrawal symptoms? Your body may experience negative effects as it tries to get used to not having nicotine in the system. These effects are called withdrawal symptoms. They may include:  Feeling hungrier than normal.  Trouble concentrating.  Irritability.  Trouble sleeping.  Feeling depressed.  Restlessness and agitation.  Craving a cigarette. To manage withdrawal symptoms:  Avoid places, people, and activities that trigger your cravings.  Remember why you want to quit.  Get plenty of sleep.  Avoid coffee and other caffeinated drinks. These may worsen some of your symptoms. How can I handle social situations? Social situations can be difficult when you are quitting smoking, especially in the first few weeks. To manage this, you can:  Avoid parties, bars, and other social situations where people might be smoking.  Avoid alcohol.  Leave right away if you have the urge to smoke.  Explain to your family and friends that you are quitting smoking. Ask for understanding and support.  Plan activities with friends or family where smoking is not an option. What are some ways I can cope with stress? Wanting to smoke may cause stress, and stress can make you want to smoke. Find ways to manage your stress. Relaxation techniques can help. For example:  Breathe slowly and deeply, in through your nose and out through your mouth.  Listen to soothing, relaxing music.  Talk with a family member or friend about your stress.  Light a candle.  Soak in a bath or take a shower.  Think about a peaceful place. What are some ways I can prevent weight gain? Be aware that many people gain weight after they quit smoking. However, not everyone does. To keep from gaining weight, have a plan in place before you quit and stick to  the plan after you quit. Your plan should include:  Having healthy snacks. When you have a craving, it may help to: ? Eat plain popcorn, crunchy carrots, celery, or other cut vegetables. ? Chew sugar-free gum.  Changing how you eat: ? Eat small portion sizes at meals. ? Eat 4-6 small meals throughout the day instead of 1-2 large meals a day. ? Be mindful when you eat. Do not watch television or do other things that might distract you as you eat.  Exercising regularly: ? Make time to exercise each day. If you do not have time for a long workout, do short bouts of exercise for 5-10 minutes several times a day. ? Do some form of strengthening exercise, like weight lifting, and some form of aerobic exercise, like running or swimming.  Drinking plenty of water or other low-calorie or no-calorie drinks. Drink 6-8 glasses of water daily, or as much as instructed by your health care provider. Summary  Quitting smoking is a physical and mental challenge. You will face cravings, withdrawal symptoms, and temptation to smoke again. Preparation can help you as you go through these challenges.  You can cope with cravings by keeping your mouth busy (such as by chewing gum), keeping your body and hands busy, and making calls to family, friends, or a helpline for people who want to quit smoking.  You can cope with withdrawal symptoms by avoiding places where people smoke, avoiding drinks with caffeine, and getting plenty of rest.  Ask your health care provider about the different ways to prevent weight gain, avoid stress, and handle social situations. This information is not intended to replace advice given to you by your health care provider. Make sure you discuss any questions you have with your health care provider. Document Revised: 10/03/2017 Document Reviewed: 10/18/2016 Elsevier Patient Education  2020 [employer].

## 2020-06-28 NOTE — Assessment & Plan Note (Signed)
Normal exam.  Patient without concerns today. Completed form for patient's work requirements -Obtaining blood work today including TSH, CBC, CMP, lipid panel

## 2020-06-28 NOTE — Assessment & Plan Note (Signed)
Refilled albuterol and Incruse today No wheezing heard on exam Counseled patient on tobacco cessation and provided with handout

## 2020-06-29 ENCOUNTER — Encounter: Payer: Self-pay | Admitting: Student in an Organized Health Care Education/Training Program

## 2020-06-29 ENCOUNTER — Other Ambulatory Visit: Payer: Self-pay | Admitting: Student in an Organized Health Care Education/Training Program

## 2020-06-29 DIAGNOSIS — E7849 Other hyperlipidemia: Secondary | ICD-10-CM

## 2020-06-29 LAB — COMPREHENSIVE METABOLIC PANEL
ALT: 14 IU/L (ref 0–44)
AST: 18 IU/L (ref 0–40)
Albumin/Globulin Ratio: 1.4 (ref 1.2–2.2)
Albumin: 4 g/dL (ref 3.8–4.9)
Alkaline Phosphatase: 95 IU/L (ref 48–121)
BUN/Creatinine Ratio: 27 — ABNORMAL HIGH (ref 9–20)
BUN: 22 mg/dL (ref 6–24)
Bilirubin Total: 0.3 mg/dL (ref 0.0–1.2)
CO2: 26 mmol/L (ref 20–29)
Calcium: 9.6 mg/dL (ref 8.7–10.2)
Chloride: 105 mmol/L (ref 96–106)
Creatinine, Ser: 0.81 mg/dL (ref 0.76–1.27)
GFR calc Af Amer: 114 mL/min/{1.73_m2} (ref >59)
GFR calc non Af Amer: 99 mL/min/{1.73_m2} (ref >59)
Globulin, Total: 2.8 g/dL (ref 1.5–4.5)
Glucose: 90 mg/dL (ref 65–99)
Potassium: 4.5 mmol/L (ref 3.5–5.2)
Sodium: 141 mmol/L (ref 134–144)
Total Protein: 6.8 g/dL (ref 6.0–8.5)

## 2020-06-29 LAB — LIPID PANEL
Chol/HDL Ratio: 2.5 ratio (ref 0.0–5.0)
Cholesterol, Total: 202 mg/dL — ABNORMAL HIGH (ref 100–199)
HDL: 80 mg/dL (ref >39)
LDL Chol Calc (NIH): 107 mg/dL — ABNORMAL HIGH (ref 0–99)
Triglycerides: 84 mg/dL (ref 0–149)
VLDL Cholesterol Cal: 15 mg/dL (ref 5–40)

## 2020-06-29 LAB — CBC
Hematocrit: 42.4 % (ref 37.5–51.0)
Hemoglobin: 13.6 g/dL (ref 13.0–17.7)
MCH: 28.9 pg (ref 26.6–33.0)
MCHC: 32.1 g/dL (ref 31.5–35.7)
MCV: 90 fL (ref 79–97)
Platelets: 155 10*3/uL (ref 150–450)
RBC: 4.71 x10E6/uL (ref 4.14–5.80)
RDW: 13 % (ref 11.6–15.4)
WBC: 8.6 10*3/uL (ref 3.4–10.8)

## 2020-06-29 LAB — TSH: TSH: 0.6 u[IU]/mL (ref 0.450–4.500)

## 2020-06-29 MED ORDER — ATORVASTATIN CALCIUM 40 MG PO TABS: 40.0000 mg | ORAL_TABLET | Freq: Every day | ORAL | 2 refills | Status: DC

## 2020-07-10 ENCOUNTER — Emergency Department (HOSPITAL_COMMUNITY)
Admission: EM | Admit: 2020-07-10 | Discharge: 2020-07-10 | Disposition: A | Discharge status: Home / Self Care | Payer: BC Managed Care – PPO | Attending: Emergency Medicine | Admitting: Emergency Medicine

## 2020-07-10 ENCOUNTER — Other Ambulatory Visit: Payer: Self-pay

## 2020-07-10 DIAGNOSIS — Z5321 Procedure and treatment not carried out due to patient leaving prior to being seen by health care provider: Secondary | ICD-10-CM | POA: Diagnosis not present

## 2020-07-10 DIAGNOSIS — J45909 Unspecified asthma, uncomplicated: Secondary | ICD-10-CM | POA: Diagnosis not present

## 2020-07-10 NOTE — ED Triage Notes (Signed)
Pt here form home for eval of two asthma flare-ups over the last two days. Pt using inhaler with some relief.

## 2021-01-18 ENCOUNTER — Other Ambulatory Visit: Payer: Self-pay

## 2021-01-18 ENCOUNTER — Ambulatory Visit (INDEPENDENT_AMBULATORY_CARE_PROVIDER_SITE_OTHER): Payer: BC Managed Care – PPO | Admitting: Family Medicine

## 2021-01-18 ENCOUNTER — Encounter: Payer: Self-pay | Admitting: Family Medicine

## 2021-01-18 DIAGNOSIS — M1712 Unilateral primary osteoarthritis, left knee: Secondary | ICD-10-CM

## 2021-01-18 MED ORDER — IBUPROFEN 400 MG PO TABS: 800.0000 mg | ORAL_TABLET | Freq: Three times a day (TID) | ORAL | 2 refills | Status: DC | PRN

## 2021-01-18 NOTE — Progress Notes (Signed)
    SUBJECTIVE:   CHIEF COMPLAINT / HPI: knee pain   Knee Pain  Patient reports pain in his left knee for for several years stating that he has osteoarthritis.  Patient reports that pain has been increasing over the last few weeks.  He has been trying to apply topicals for the pain without much change.  Patient reports that he has not had any recent trauma to his legs or knees.  He has not noticed any redness near his knee but states that he has had some swelling.  PERTINENT  PMH / PSH:  Asthma Hyperlipidemia Hx of knee OA   OBJECTIVE:   BP (!) 148/92   Pulse 71   Ht 5\' 8"  (1.727 m)   Wt 150 lb (68 kg)   SpO2 99%   BMI 22.81 kg/m   Knee: - Inspection: no gross deformity. No swelling/effusion, erythema or bruising. Skin intact - Palpation: no TTP - ROM: full active ROM with extension in knee and hip, patient limited to approximately 30 degree flexion of left knee, full range of motion on right knee - Strength: 5/5 strength - Neuro/vasc: NV intact - Special Tests: - LIGAMENTS: negative anterior and posterior drawer, negative Lachman's, no MCL or LCL laxity  -- MENISCUS: negative McMurray's  Hips: normal ROM   ASSESSMENT/PLAN:   Knee osteoarthritis Patient has not had x-rays of his knee since 2014, however these images show changes consistent with osteoarthritis. Offered steroid injections, patient declines today. Patient states that he would like to try physical therapy in the future but for now prefers to do either Voltaren gel or NSAIDs. Prescribed 400 mg ibuprofen Patient to follow-up in 4 weeks   Reviewed xrays from 2014 showing OA changes of medial compartment.  Offered injections, PT and voltaren gel vs oral NSAIDs for pain management.   2015, MD [facility]

## 2021-01-18 NOTE — Patient Instructions (Signed)
It was a pleasure to see you today!  Thank you for choosing [facility] for your primary care.   Kevin Fitzgerald was seen for knee pain.   Our plans for today were:   I have prescribed a medication to help with your knee pain  Please continue to do your stretches for her legs to help strengthen the muscles around your knee.   To keep you healthy, please keep in mind the following health maintenance items that you are due for:   1. COVID vaccine  2. Colonoscopy    You should return to our clinic in 4 weeks  for knee pain follow up.   Best Wishes,   Dr. Neita Garnet    Chronic Knee Pain, Adult Chronic knee pain is pain in one or both knees that lasts longer than 3 months. Symptoms of chronic knee pain may include swelling, stiffness, and discomfort. Age-related wear and tear (osteoarthritis) of the knee joint is the most common cause of chronic knee pain. Other possible causes include:  A long-term immune-related disease that causes inflammation of the knee (rheumatoid arthritis). This usually affects both knees.  Inflammatory arthritis, such as gout or pseudogout.  An injury to the knee that causes arthritis.  An injury to the knee that damages the ligaments. Ligaments are strong tissues that connect bones to each other.  Runner's knee or pain behind the kneecap. Treatment for chronic knee pain depends on the cause. The main treatments for chronic knee pain are physical therapy and weight loss. This condition may also be treated with medicines, injections, a knee sleeve or brace, and by using crutches. Rest, ice, pressure (compression), and elevation, also known as RICE therapy, may also be recommended. Follow these instructions at home: If you have a knee sleeve or brace:  Wear the knee sleeve or brace as told by your health care provider. Remove it only as told by your health care provider.  Loosen it if your toes tingle, become numb, or turn cold and  blue.  Keep it clean.  If the sleeve or brace is not waterproof: ? Do not let it get wet. ? Remove it if allowed by your health care provider, or cover it with a watertight covering when you take a bath or a shower.   Managing pain, stiffness, and swelling  If directed, apply heat to the affected area as often as told by your health care provider. Use the heat source that your health care provider recommends, such as a moist heat pack or a heating pad. ? If you have a removable knee sleeve or brace, remove it as told by your health care provider. ? Place a towel between your skin and the heat source. ? Leave the heat on for 20-30 minutes. ? Remove the heat if your skin turns bright red. This is especially important if you are unable to feel pain, heat, or cold. You may have a greater risk of getting burned.  If directed, put ice on the affected area. To do this: ? If you have a removable knee sleeve or brace, remove it as told by your health care provider. ? Put ice in a plastic bag. ? Place a towel between your skin and the bag. ? Leave the ice on for 20 minutes, 2-3 times a day. ? Remove the ice if your skin turns bright red. This is very important. If you cannot feel pain, heat, or cold, you have a greater risk of damage to the  area.  Move your toes often to reduce stiffness and swelling.  Raise (elevate) the injured area above the level of your heart while you are sitting or lying down.      Activity  Avoid high-impact activities or exercises, such as running, jumping rope, or doing jumping jacks.  Follow the exercise plan that your health care provider designed for you. Your health care provider may suggest that you: ? Avoid activities that make knee pain worse. This may require you to change your exercise routines, sport participation, or job duties. ? Wear shoes with cushioned soles. ? Avoid sports that require running and sudden changes in direction. ? Do physical therapy.  Physical therapy is planned to match your needs and abilities. It may include exercises for strength, flexibility, stability, and endurance. ? Do exercises that increase balance and strength, such as tai chi and yoga.  Do not use the injured limb to support your body weight until your health care provider says that you can. Use crutches as told by your health care provider.  Return to your normal activities as told by your health care provider. Ask your health care provider what activities are safe for you. General instructions  Take over-the-counter and prescription medicines only as told by your health care provider.  Lose weight if you are overweight. Losing even a little weight can reduce knee pain. Ask your health care provider what your ideal weight is, and how to safely lose extra weight. A dietitian may be able to help you plan your meals.  Do not use any products that contain nicotine or tobacco, such as cigarettes, e-cigarettes, and chewing tobacco. These can delay healing. If you need help quitting, ask your health care provider.  Keep all follow-up visits. This is important. Contact a health care provider if:  You have knee pain that is not getting better or gets worse.  You are unable to do your physical therapy exercises due to knee pain. Get help right away if:  Your knee swells and the swelling becomes worse.  You cannot move your knee.  You have severe knee pain. Summary  Knee pain that lasts more than 3 months is considered chronic knee pain.  The main treatments for chronic knee pain are physical therapy and weight loss. You may also need to take medicines, wear a knee sleeve or brace, use crutches, and apply ice or heat.  Losing even a little weight can reduce knee pain. Ask your health care provider what your ideal weight is, and how to safely lose extra weight. A dietitian may be able to help you plan your meals.  Follow the exercise plan that your health care  provider designed for you. This information is not intended to replace advice given to you by your health care provider. Make sure you discuss any questions you have with your health care provider. Document Revised: 04/05/2020 Document Reviewed: 04/05/2020 Elsevier Patient Education  2021 [employer].

## 2021-01-20 DIAGNOSIS — M1710 Unilateral primary osteoarthritis, unspecified knee: Secondary | ICD-10-CM | POA: Insufficient documentation

## 2021-01-20 DIAGNOSIS — M179 Osteoarthritis of knee, unspecified: Secondary | ICD-10-CM | POA: Insufficient documentation

## 2021-01-20 NOTE — Assessment & Plan Note (Addendum)
Patient has not had x-rays of his knee since 2014, however these images show changes consistent with osteoarthritis. Offered steroid injections, patient declines today. Patient states that he would like to try physical therapy in the future but for now prefers to do either Voltaren gel or NSAIDs. Prescribed 400 mg ibuprofen Patient to follow-up in 4 weeks

## 2021-03-09 ENCOUNTER — Other Ambulatory Visit: Payer: Self-pay

## 2021-03-09 ENCOUNTER — Ambulatory Visit
Admission: RE | Admit: 2021-03-09 | Discharge: 2021-03-09 | Disposition: A | Discharge status: Home / Self Care | Payer: BC Managed Care – PPO | Source: Ambulatory Visit | Attending: Family Medicine | Admitting: Family Medicine

## 2021-03-09 ENCOUNTER — Ambulatory Visit (INDEPENDENT_AMBULATORY_CARE_PROVIDER_SITE_OTHER): Payer: BC Managed Care – PPO | Admitting: Family Medicine

## 2021-03-09 ENCOUNTER — Ambulatory Visit (HOSPITAL_COMMUNITY)
Admission: EM | Admit: 2021-03-09 | Discharge: 2021-03-09 | Discharge status: Against Medical Advice | Payer: BC Managed Care – PPO

## 2021-03-09 ENCOUNTER — Encounter: Payer: Self-pay | Admitting: Family Medicine

## 2021-03-09 VITALS — BP 134/72 | HR 69 | Wt 156.8 lb

## 2021-03-09 DIAGNOSIS — R202 Paresthesia of skin: Secondary | ICD-10-CM | POA: Diagnosis not present

## 2021-03-09 DIAGNOSIS — E7849 Other hyperlipidemia: Secondary | ICD-10-CM

## 2021-03-09 DIAGNOSIS — M25561 Pain in right knee: Secondary | ICD-10-CM

## 2021-03-09 DIAGNOSIS — F325 Major depressive disorder, single episode, in full remission: Secondary | ICD-10-CM

## 2021-03-09 DIAGNOSIS — F17200 Nicotine dependence, unspecified, uncomplicated: Secondary | ICD-10-CM

## 2021-03-09 DIAGNOSIS — R0602 Shortness of breath: Secondary | ICD-10-CM

## 2021-03-09 DIAGNOSIS — Z8709 Personal history of other diseases of the respiratory system: Secondary | ICD-10-CM | POA: Diagnosis not present

## 2021-03-09 DIAGNOSIS — G8929 Other chronic pain: Secondary | ICD-10-CM

## 2021-03-09 DIAGNOSIS — J4520 Mild intermittent asthma, uncomplicated: Secondary | ICD-10-CM

## 2021-03-09 DIAGNOSIS — N529 Male erectile dysfunction, unspecified: Secondary | ICD-10-CM

## 2021-03-09 LAB — POCT GLYCOSYLATED HEMOGLOBIN (HGB A1C): Hemoglobin A1C: 5.5 % (ref 4.0–5.6)

## 2021-03-09 MED ORDER — DICLOFENAC SODIUM 1 % EX GEL: CUTANEOUS | 3 refills | Status: DC

## 2021-03-09 MED ORDER — NAPROXEN 500 MG PO TABS
500.0000 mg | ORAL_TABLET | Freq: Two times a day (BID) | ORAL | 0 refills | Status: DC
Start: 2021-03-09 — End: 2022-05-16

## 2021-03-09 MED ORDER — INCRUSE ELLIPTA 62.5 MCG/INH IN AEPB: 1.0000 | INHALATION_SPRAY | Freq: Every morning | RESPIRATORY_TRACT | 0 refills | Status: DC

## 2021-03-09 MED ORDER — TADALAFIL 10 MG PO TABS: 10.0000 mg | ORAL_TABLET | Freq: Every day | ORAL | 3 refills | Status: AC | PRN

## 2021-03-09 MED ORDER — ALBUTEROL SULFATE HFA 108 (90 BASE) MCG/ACT IN AERS: 2.0000 | INHALATION_SPRAY | Freq: Four times a day (QID) | RESPIRATORY_TRACT | 5 refills | Status: DC | PRN

## 2021-03-09 MED ORDER — ATORVASTATIN CALCIUM 40 MG PO TABS: 40.0000 mg | ORAL_TABLET | Freq: Every day | ORAL | 2 refills | Status: DC

## 2021-03-09 NOTE — Progress Notes (Deleted)
  Kevin Fitzgerald is a 60 y.o. male brought for a well child visit by the {CHL AMB PED RELATIVES:195022}.  PCP: Joana Reamer, DO  Current issues: Current concerns include:***  Nutrition: Current diet: *** Milk type and volume:*** Juice volume: *** Uses cup: {Responses; yes**/no:17258} Takes vitamin with iron: {YES NO:22349:o}  Elimination: Stools: {CHL AMB PED REVIEW OF ELIMINATION GGYIR:485462} Voiding: {Normal/Abnormal Appearance:21344::"normal"}  Sleep/behavior: Sleep location: *** Sleep position: {CHL AMB PED PRONE/SUPINE/LATERAL:193892} Behavior: {CHL AMB PED SLEEP BEHAVIOR VO:350093}  Oral health risk assessment:: Dental varnish flowsheet completed: {yes no:315493::"Yes"}  Social screening: Current child-care arrangements: {Child care arrangements; list:21483} Family situation: {GEN; CONCERNS:18717}  TB risk: {YES NO:22349:a:"not discussed"}  Developmental screening: Name of developmental screening tool used: *** Screen passed: {yes no:315493::"Yes"} Results discussed with parent: {yes no:315493::"Yes"}  Objective:  BP 134/72   Pulse 69   Wt 156 lb 12.8 oz (71.1 kg)   SpO2 97%   BMI 23.84 kg/m  Facility age limit for growth percentiles is 20 years. Facility age limit for growth percentiles is 20 years. Facility age limit for growth percentiles is 20 years.  Growth chart reviewed and appropriate for age: {YES/NO AS:20300}  General: {CHL AMB PED GENERAL EXAM G:182993} Skin: normal, no rashes Head: normal fontanelles, normal appearance Eyes: red reflex normal bilaterally Ears: normal pinnae bilaterally; TMs *** Nose: no discharge Oral cavity: lips, mucosa, and tongue normal; gums and palate normal; oropharynx normal; teeth - *** Lungs: clear to auscultation bilaterally Heart: regular rate and rhythm, normal S1 and S2, no murmur Abdomen: soft, non-tender; bowel sounds normal; no masses; no organomegaly GU: {CHL AMB PED GENITALIA  EXAM:2101301} Femoral pulses: present and symmetric bilaterally Extremities: extremities normal, atraumatic, no cyanosis or edema Neuro: moves all extremities spontaneously, normal strength and tone  Assessment and Plan:   60 y.o. male infant here for well child visit  Lab results: {CHL AMB PED LAB RESULTS I:210130800}  Growth (for gestational age): {CHL AMB PED ZJIRCV:893810175}  Development: {desc; development appropriate/delayed:19200}  Anticipatory guidance discussed: {CHL AMB PED ANTICIPATORY GUIDANCE 0-18 ZWC:585277824}  Oral health: Dental varnish applied today: {yes no:315493::"Yes"} Counseled regarding age-appropriate oral health: {yes no:315493::"Yes"}  Reach Out and Read: advice and book given: {YES/NO AS:20300}  Counseling provided for {CHL AMB PED VACCINE COUNSELING:210130100} following vaccine component  Orders Placed This Encounter  Procedures  . DG Knee Complete 4 Views Right  . HgB A1c    No follow-ups on file.  Westley Chandler, MD

## 2021-03-09 NOTE — Progress Notes (Deleted)
    SUBJECTIVE:   CHIEF COMPLAINT / HPI:   Leg swelling, feels like pins and needles and hard to walk  No cardiac history. No ECHO in chart.  COPD-   PERTINENT  PMH / PSH: COPD with asthma, knee OA, dyslipidemia, MDD  OBJECTIVE:   There were no vitals taken for this visit.  ***  ASSESSMENT/PLAN:   No problem-specific Assessment & Plan notes found for this encounter.     Billey Co, MD [facility]   {    This will disappear when note is signed, click to select method of visit    :1}

## 2021-03-09 NOTE — Assessment & Plan Note (Signed)
Suspect this is actually a component of COPD, discussed tobacco cessation.  Refilled umeclidinium and albuterol.  Discontinued epinephrine after lengthy discussion.

## 2021-03-09 NOTE — Progress Notes (Signed)
    SUBJECTIVE:   CHIEF COMPLAINT / HPI:   Kevin Fitzgerald is a very pleasant 60 year old gentleman with history significant for tobacco abuse, dyslipidemia and osteoarthritis of the left knee presented today for right knee pain.  He reports this is actually tingling over his right knee.  He describes the pain and crunching sensation.  He reports this does disturb his gait.  He denies hip or back pain here.  He denies fevers, redness.  He does endorse mild swelling.  He has tried 1 Aleve in the past month for this.  He denies lower extremity edema, numbness or tingling.  He does report he has noticed an increase in his urination but denies polydipsia.  The patient reports he drinks only 1 or 2 beers per year.  He is decreasing his tobacco use but is not interested in quitting at this time.  He did report that he has a low mood and has had this his whole life.  He is not interested in counseling or medication at this time.  He denies a plan to hurt himself or hurt others.  He lives with his wife and does tell her about his feelings.  PERTINENT  PMH / PSH/Family/Social History : Updated and reviewed as appropriate.  OBJECTIVE:   BP 134/72   Pulse 69   Wt 156 lb 12.8 oz (71.1 kg)   SpO2 97%   BMI 23.84 kg/m   Today's weight:  Last Weight  Most recent update: 03/09/2021 11:28 AM   Weight  71.1 kg (156 lb 12.8 oz)           Review of prior weights: Filed Weights   03/09/21 1128  Weight: 156 lb 12.8 oz (71.1 kg)   He overall is a thin appearing gentleman in no distress. Cardiac exam regular rate and rhythm.  No murmurs rubs or gallops.  Lungs are clear  MSK exam is notable for bilateral varicose veins.  There is no edema.  He has 2+ dorsalis pedis pulses.  Thin appearing lower extremities.  Left knee with good range of motion with positive crepitus no medial joint line tenderness.  The right knee has what appears to be a small effusion.  There is no erythema or warmth.  He has medial  joint line tenderness and also tenderness over the pes bursa.  Crepitus on examination and some pain with full extension.  ASSESSMENT/PLAN:   Tobacco use disorder Recommended smoking cessation, he is precontemplative at this time.  MDD (major depressive disorder) Discussed options for treatment and therapy, he declines at this time.  He denies thoughts of hurting himself or others.  He reports when he has a little mood he goes fishing and this helps him.  Asthma Suspect this is actually a component of COPD, discussed tobacco cessation.  Refilled umeclidinium and albuterol.  Discontinued epinephrine after lengthy discussion.   Right knee pain no acute trauma suspect this is osteoarthritis less likely gout.  Also considered meniscal pathology.  There is no calf tenderness or edema suggestive of a DVT.  He did initially complain of tingling in the area.  A1c was within normal limits. X-ray today, naproxen for 2 weeks (no history of renal disease), may need Orthopedics referral given his exam.   Healthcare maintenance Declined any further COVID vaccines he is received 2.  Follow-up scheduled with Dr. Mauri Reading.    Terisa Starr, MD  Family Medicine Teaching Service  [facility]

## 2021-03-09 NOTE — Patient Instructions (Addendum)
It was wonderful to see you today.  Please bring ALL of your medications with you to every visit.   Today we talked about:  --Refills on your inhalers--sent to your pharmacy  ---I do not recommend using epinephrine for your asthma--please use albuterol--I am happy to discuss again if you would like  -- Going to [facility] for a knee x-ray  -- I will call you with x-ray results and we will go from there  -- Please do knee exercises twice per day to see if we can gain strength    Thank you for choosing [facility].   Please call [phone] with any questions about today's appointment.  Please be sure to schedule follow up at the front  desk before you leave today.   Terisa Starr, MD  Family Medicine

## 2021-03-09 NOTE — Assessment & Plan Note (Signed)
Recommended smoking cessation, he is precontemplative at this time.

## 2021-03-09 NOTE — Assessment & Plan Note (Signed)
Discussed options for treatment and therapy, he declines at this time.  He denies thoughts of hurting himself or others.  He reports when he has a little mood he goes fishing and this helps him.

## 2021-03-10 ENCOUNTER — Encounter (HOSPITAL_COMMUNITY): Payer: Self-pay | Admitting: Emergency Medicine

## 2021-03-10 ENCOUNTER — Emergency Department (HOSPITAL_COMMUNITY): Payer: BC Managed Care – PPO

## 2021-03-10 ENCOUNTER — Other Ambulatory Visit: Payer: Self-pay

## 2021-03-10 ENCOUNTER — Emergency Department (HOSPITAL_COMMUNITY)
Admission: EM | Admit: 2021-03-10 | Discharge: 2021-03-10 | Disposition: A | Discharge status: Home / Self Care | Payer: BC Managed Care – PPO | Attending: Emergency Medicine | Admitting: Emergency Medicine

## 2021-03-10 DIAGNOSIS — Z743 Need for continuous supervision: Secondary | ICD-10-CM | POA: Diagnosis not present

## 2021-03-10 DIAGNOSIS — R059 Cough, unspecified: Secondary | ICD-10-CM | POA: Diagnosis not present

## 2021-03-10 DIAGNOSIS — F17200 Nicotine dependence, unspecified, uncomplicated: Secondary | ICD-10-CM | POA: Diagnosis not present

## 2021-03-10 DIAGNOSIS — R0602 Shortness of breath: Secondary | ICD-10-CM | POA: Diagnosis not present

## 2021-03-10 DIAGNOSIS — J4541 Moderate persistent asthma with (acute) exacerbation: Secondary | ICD-10-CM | POA: Insufficient documentation

## 2021-03-10 DIAGNOSIS — Z7951 Long term (current) use of inhaled steroids: Secondary | ICD-10-CM | POA: Diagnosis not present

## 2021-03-10 DIAGNOSIS — J441 Chronic obstructive pulmonary disease with (acute) exacerbation: Secondary | ICD-10-CM | POA: Insufficient documentation

## 2021-03-10 DIAGNOSIS — F17210 Nicotine dependence, cigarettes, uncomplicated: Secondary | ICD-10-CM | POA: Insufficient documentation

## 2021-03-10 DIAGNOSIS — R062 Wheezing: Secondary | ICD-10-CM | POA: Diagnosis not present

## 2021-03-10 DIAGNOSIS — Z20822 Contact with and (suspected) exposure to covid-19: Secondary | ICD-10-CM | POA: Diagnosis not present

## 2021-03-10 DIAGNOSIS — Z20828 Contact with and (suspected) exposure to other viral communicable diseases: Secondary | ICD-10-CM | POA: Diagnosis not present

## 2021-03-10 DIAGNOSIS — I10 Essential (primary) hypertension: Secondary | ICD-10-CM | POA: Diagnosis not present

## 2021-03-10 LAB — CBC WITH DIFFERENTIAL/PLATELET
Abs Immature Granulocytes: 0.02 10*3/uL (ref 0.00–0.07)
Basophils Absolute: 0.1 10*3/uL (ref 0.0–0.1)
Basophils Relative: 1 %
Eosinophils Absolute: 1.3 10*3/uL — ABNORMAL HIGH (ref 0.0–0.5)
Eosinophils Relative: 12 %
HCT: 45.6 % (ref 39.0–52.0)
Hemoglobin: 14.6 g/dL (ref 13.0–17.0)
Immature Granulocytes: 0 %
Lymphocytes Relative: 29 %
Lymphs Abs: 3 10*3/uL (ref 0.7–4.0)
MCH: 29.3 pg (ref 26.0–34.0)
MCHC: 32 g/dL (ref 30.0–36.0)
MCV: 91.4 fL (ref 80.0–100.0)
Monocytes Absolute: 0.5 10*3/uL (ref 0.1–1.0)
Monocytes Relative: 5 %
Neutro Abs: 5.4 10*3/uL (ref 1.7–7.7)
Neutrophils Relative %: 53 %
Platelets: 150 10*3/uL (ref 150–400)
RBC: 4.99 MIL/uL (ref 4.22–5.81)
RDW: 12.5 % (ref 11.5–15.5)
WBC: 10.4 10*3/uL (ref 4.0–10.5)
nRBC: 0 % (ref 0.0–0.2)

## 2021-03-10 LAB — BASIC METABOLIC PANEL
Anion gap: 6 (ref 5–15)
BUN: 18 mg/dL (ref 6–20)
CO2: 28 mmol/L (ref 22–32)
Calcium: 9.4 mg/dL (ref 8.9–10.3)
Chloride: 103 mmol/L (ref 98–111)
Creatinine, Ser: 0.86 mg/dL (ref 0.61–1.24)
GFR, Estimated: >60 mL/min (ref >60)
Glucose, Bld: 99 mg/dL (ref 70–99)
Potassium: 4.7 mmol/L (ref 3.5–5.1)
Sodium: 137 mmol/L (ref 135–145)

## 2021-03-10 LAB — RESP PANEL BY RT-PCR (FLU A&B, COVID) ARPGX2
Influenza A by PCR: NEGATIVE
Influenza B by PCR: NEGATIVE
SARS Coronavirus 2 by RT PCR: NEGATIVE

## 2021-03-10 MED ORDER — ALBUTEROL (5 MG/ML) CONTINUOUS INHALATION SOLN
10.0000 mg/h | INHALATION_SOLUTION | Freq: Once | RESPIRATORY_TRACT | Status: AC
  Administered 2021-03-10: 10 mg/h via RESPIRATORY_TRACT
  Filled 2021-03-10: qty 20

## 2021-03-10 MED ORDER — IPRATROPIUM BROMIDE HFA 17 MCG/ACT IN AERS
2.0000 | INHALATION_SPRAY | Freq: Once | RESPIRATORY_TRACT | Status: AC
  Administered 2021-03-10: 2 via RESPIRATORY_TRACT
  Filled 2021-03-10: qty 12.9

## 2021-03-10 MED ORDER — PREDNISONE 20 MG PO TABS
60.0000 mg | ORAL_TABLET | Freq: Once | ORAL | Status: AC
  Administered 2021-03-10: 60 mg via ORAL
  Filled 2021-03-10: qty 3

## 2021-03-10 MED ORDER — ALBUTEROL SULFATE HFA 108 (90 BASE) MCG/ACT IN AERS: 4.0000 | INHALATION_SPRAY | Freq: Once | RESPIRATORY_TRACT | Status: DC

## 2021-03-10 MED ORDER — PREDNISONE 20 MG PO TABS: 40.0000 mg | ORAL_TABLET | Freq: Every day | ORAL | 0 refills | Status: AC

## 2021-03-10 NOTE — Discharge Instructions (Signed)
  You were evaluated in the Emergency Department and after careful evaluation, we did not find any emergent condition requiring admission or further testing in the hospital.   Your exam/testing today was overall reassuring.  Symptoms seem to be due to asthma exacerbation.  I want you to take the steroids as we discussed, I also want you to follow-up with an asthma specialist, there information is provided.  I want to take the albuterol 2 puffs every 6 hours as needed and you can also take the Atrovent 2 puffs if you begin to have a acute flare.  Stop using tobacco, this will help you greatly.  Use the attached instructions, your COVID swab will result in the next couple of hours, you can see this on MyChart. Please return to the Emergency Department if you experience any worsening of your condition.  Thank you for allowing [place] to be a part of your care. Please speak to your pharmacist about any new medications prescribed today in regards to side effects or interactions with other medications.   Your blood pressure was also elevated today, I want you to talk to your primary care doctor about this as well.

## 2021-03-10 NOTE — ED Triage Notes (Signed)
Pt to triage via [facility] from home.  Reports asthma flare-up x 6 months.  Seen by PCP yesterday and states the medication isn't helping and causing him to cough more.  Reports cough induced vomiting.

## 2021-03-10 NOTE — ED Notes (Signed)
Patient given discharge instructions. Questions were answered. Patient verbalized understanding of discharge instructions and care at home.  

## 2021-03-10 NOTE — ED Provider Notes (Signed)
[facility] EMERGENCY DEPARTMENT Provider Note   CSN: [id number] Arrival date & time: 03/10/21  0750     History Chief Complaint  Patient presents with  . Asthma    Kevin Fitzgerald is a 60 y.o. male with pertinent past medical history of asthma that presents the emergency department today for asthma flare.  Patient states that he has had bad asthma for the past 6 months, states that he has been unable to shake his wheezing and shortness of breath and wheezing for the last 6 months.  Worsening over the past couple of days.  States that he saw his PCP yesterday and states that the medication that was prescribed is not helping much and is actually causing him to cough more.  States that he is coughing so much that he sometimes he will vomit.  Denies any hemoptysis or hematemesis.  Denies any fevers.  Patient does smoke tobacco, states that he used to smoke a pack per day, however is trying to only smoke 1 cigarette a day now.  Denies any abdominal pain nausea vomiting.  Denies any congestion or sore throat.  Has been vaccinated against COVID, denies any sick contacts.  States that for her asthma he uses albuterol, 1 puff as needed, only once a day primarily.  Patient is also on umeclidinium.  Patient also admits to some chest pressure when he hears himself wheeze, no chest pain or radiation, not worse with exertion.  Denies any fevers, URI symptoms besides cough.  Per review PCP suspects that this is actually a component of COPD.  HPI     Past Medical History:  Diagnosis Date  . Arthritis    knee  . Asthma    as child  . Hand fracture, right   . Hypercholesteremia     Patient Active Problem List   Diagnosis Date Noted  . Knee osteoarthritis 01/20/2021  . Asthma   . Acute exacerbation of COPD with asthma (HCC) 02/14/2020  . Hyperlipemia 07/02/2018  . Erectile dysfunction 05/16/2017  . Tobacco use disorder 05/16/2017  . MDD (major depressive disorder) 09/12/2015     Past Surgical History:  Procedure Laterality Date  . FINGER ARTHRODESIS Right 12/13/2015   Procedure: RIGHT 4TH AND 5TH CARPOMETACARPAL ARTHRODESIS;  Surgeon: Tarry Kos, MD;  Location: [facility];  Service: Orthopedics;  Laterality: Right;  . TRANSURETHRAL RESECTION OF PROSTATE N/A 09/22/2013   Procedure: TRANSURETHRAL RESECTION OF THE PROSTATE WITH GYRUS INSTRUMENTS;  Surgeon: Sebastian Ache, MD;  Location: [facility];  Service: Urology;  Laterality: N/A;       Family History  Problem Relation Age of Onset  . Cancer Mother   . Asthma Mother   . Stroke Mother   . Diabetes Mother   . Heart attack Father   . Diabetes Brother   . Asthma Brother   . Birth defects Brother   . Heart disease Brother     Social History   Tobacco Use  . Smoking status: Current Every Day Smoker    Packs/day: 1.00    Years: 35.00    Pack years: 35.00    Types: Cigarettes  . Smokeless tobacco: Never Used  Vaping Use  . Vaping Use: Never used  Substance Use Topics  . Alcohol use: Yes    Comment: occ  . Drug use: No    Home Medications Prior to Admission medications   Medication Sig Start Date End Date Taking? Authorizing Provider  predniSONE (DELTASONE) 20 MG tablet  Take 2 tablets (40 mg total) by mouth daily for 5 days. 03/10/21 03/15/21 Yes Mazin Emma, PA-C  albuterol (VENTOLIN HFA) 108 (90 Base) MCG/ACT inhaler Inhale 2 puffs into the lungs every 6 (six) hours as needed for wheezing or shortness of breath. 03/09/21   Westley Chandler, MD  atorvastatin (LIPITOR) 40 MG tablet Take 1 tablet (40 mg total) by mouth daily. 03/09/21   Westley Chandler, MD  diclofenac Sodium (VOLTAREN) 1 % GEL Use four times daily as needed for right knee pain 03/09/21   Westley Chandler, MD  naproxen (NAPROSYN) 500 MG tablet Take 1 tablet (500 mg total) by mouth 2 (two) times daily with a meal. 03/09/21   Westley Chandler, MD  tadalafil (CIALIS) 10 MG tablet Take 1 tablet (10 mg total) by mouth daily as needed  for erectile dysfunction. 03/09/21   Westley Chandler, MD  umeclidinium bromide (INCRUSE ELLIPTA) 62.5 MCG/INH AEPB Inhale 1 puff into the lungs every morning. 03/09/21   Westley Chandler, MD    Allergies    Patient has no known allergies.  Review of Systems   Review of Systems  Constitutional: Negative for chills, diaphoresis, fatigue and fever.  HENT: Negative for congestion, sore throat and trouble swallowing.   Eyes: Negative for pain and visual disturbance.  Respiratory: Positive for cough, chest tightness, shortness of breath and wheezing.   Cardiovascular: Negative for chest pain, palpitations and leg swelling.  Gastrointestinal: Negative for abdominal distention, abdominal pain, diarrhea, nausea and vomiting.  Genitourinary: Negative for difficulty urinating.  Musculoskeletal: Negative for back pain, neck pain and neck stiffness.  Skin: Negative for pallor.  Neurological: Negative for dizziness, speech difficulty, weakness and headaches.  Psychiatric/Behavioral: Negative for confusion.    Physical Exam Updated Vital Signs BP (!) 151/86 (BP Location: Left Arm)   Pulse 89   Temp (!) 97.4 F (36.3 C) (Oral)   Resp 20   Ht 5\' 6"  (1.676 m)   Wt 70.8 kg   SpO2 95%   BMI 25.18 kg/m   Physical Exam Constitutional:      General: He is not in acute distress.    Appearance: Normal appearance. He is not ill-appearing, toxic-appearing or diaphoretic.     Comments: No respiratory distress, speaking to me in full sentences.  No tripoding.  HENT:     Mouth/Throat:     Mouth: Mucous membranes are moist.     Pharynx: Oropharynx is clear.  Eyes:     General: No scleral icterus.    Extraocular Movements: Extraocular movements intact.     Pupils: Pupils are equal, round, and reactive to light.  Cardiovascular:     Rate and Rhythm: Normal rate and regular rhythm.     Pulses: Normal pulses.     Heart sounds: Normal heart sounds.  Pulmonary:     Effort: Pulmonary effort is normal. No  respiratory distress.     Breath sounds: No stridor. Wheezing present. No rhonchi or rales.     Comments: Moderate expiratory wheezes heard throughout all lung fields.  No tachypnea, satting 100%. Chest:     Chest wall: No tenderness.  Abdominal:     General: Abdomen is flat. There is no distension.     Palpations: Abdomen is soft.     Tenderness: There is no abdominal tenderness. There is no guarding or rebound.  Musculoskeletal:        General: No swelling or tenderness. Normal range of motion.     Cervical  back: Normal range of motion and neck supple. No rigidity.     Right lower leg: No edema.     Left lower leg: No edema.  Skin:    General: Skin is warm and dry.     Capillary Refill: Capillary refill takes less than 2 seconds.     Coloration: Skin is not pale.  Neurological:     General: No focal deficit present.     Mental Status: He is alert and oriented to person, place, and time.  Psychiatric:        Mood and Affect: Mood normal.        Behavior: Behavior normal.     ED Results / Procedures / Treatments   Labs (all labs ordered are listed, but only abnormal results are displayed) Labs Reviewed  CBC WITH DIFFERENTIAL/PLATELET - Abnormal; Notable for the following components:      Result Value   Eosinophils Absolute 1.3 (*)    All other components within normal limits  RESP PANEL BY RT-PCR (FLU A&B, COVID) ARPGX2  BASIC METABOLIC PANEL    EKG None  Radiology DG Chest Port 1 View  Result Date: 03/10/2021 CLINICAL DATA:  60 year old male with shortness of breath. Increasing coughing. EXAM: PORTABLE CHEST 1 VIEW COMPARISON:  Chest radiographs 03/04/2020 and earlier. FINDINGS: Portable AP semi upright view at 0833 hours. Stable lung volumes with borderline to mild hyperinflation. No pneumothorax. Allowing for portable technique the lungs are clear. Mediastinal contours remain within normal limits. Visualized tracheal air column is within normal limits. Mild dextroconvex  scoliosis. No acute osseous abnormality identified. Negative visible bowel gas pattern. IMPRESSION: No acute cardiopulmonary abnormality. Electronically Signed   By: Odessa FlemingH  Hall M.D.   On: 03/10/2021 08:56   DG Knee Complete 4 Views Right  Result Date: 03/10/2021 CLINICAL DATA:  60 year old male with right knee pain for 2 weeks. Anterior pain. EXAM: RIGHT KNEE - COMPLETE 4+ VIEW COMPARISON:  Right knee series 04/11/2013. FINDINGS: Weightbearing views. No evidence of joint effusion. No acute osseous abnormality identified. Joint spaces and alignment remain normal for age. There is mild degenerative spurring of the tibial intercondylar eminence and the lateral joint space. IMPRESSION: Mild for age degenerative changes most pronounced in the lateral compartment. No acute osseous abnormality identified. Electronically Signed   By: Odessa FlemingH  Hall M.D.   On: 03/10/2021 08:58    Procedures Procedures   Medications Ordered in ED Medications  predniSONE (DELTASONE) tablet 60 mg (60 mg Oral Given 03/10/21 0820)  ipratropium (ATROVENT HFA) inhaler 2 puff (2 puffs Inhalation Given 03/10/21 0839)  albuterol (PROVENTIL,VENTOLIN) solution continuous neb (10 mg/hr Nebulization Given 03/10/21 0848)    ED Course  I have reviewed the triage vital signs and the nursing notes.  Pertinent labs & imaging results that were available during my care of the patient were reviewed by me and considered in my medical decision making (see chart for details).    MDM Rules/Calculators/A&P                         Kevin Fitzgerald is a 60 y.o. male with pertinent past medical history of asthma that presents the emergency department today for asthma flare.  Patient is not in any respiratory distress, mild to moderate asthma with wheezing heard in all lung fields.  Patient is not in distress.  Patient rejecting albuterol inhaler at this time, stating that he thinks this is causing him to cough more.  Patient most likely  with COPD/asthma with  frequent tobacco use.  Will give Atrovent, steroids and nebulizer at this time and reevaluate.  Upon reevaluation, patient states that he feels much better, wheezing has completely resolved at this time.  Work-up today with chest x-ray without any acute cardiopulmonary disease, does show hyperinflation suggesting that there could be a component of COPD as well.  Did discuss tobacco cessation in depth.  CBC and CMP unremarkable, patient does not want to wait until flu and COVID test has resulted.  Will discharge home with steroid burst, symptomatic treatment discussed and will have patient follow-up with asthma specialist.  In regards to chest tightness, do not think this is cardiac, states that this is resolved with albuterol treatment, most likely related to asthma.  EKG interpreted by Dr. Anitra Lauth.  patient agreeable with plan, to be discharged at this time.  Doubt need for further emergent work up at this time. I explained the diagnosis and have given explicit precautions to return to the ER including for any other new or worsening symptoms. The patient understands and accepts the medical plan as it's been dictated and I have answered their questions. Discharge instructions concerning home care and prescriptions have been given. The patient is STABLE and is discharged to home in good condition.  The patient was counseled on the dangers of tobacco use, and was advised to quit.  Reviewed strategies to maximize success, including removing cigarettes and smoking materials from environment, stress management, substitution of other forms of reinforcement, support of family/friends and written materials. Total time was 5 min CPT code 84536.   Final Clinical Impression(s) / ED Diagnoses Final diagnoses:  Moderate persistent asthma with exacerbation    Rx / DC Orders ED Discharge Orders         Ordered    predniSONE (DELTASONE) 20 MG tablet  Daily        03/10/21 1000           Farrel Gordon,  PA-C 03/10/21 1027    Gwyneth Sprout, MD 03/11/21 [phone]

## 2021-03-21 ENCOUNTER — Telehealth: Payer: Self-pay | Admitting: Family Medicine

## 2021-03-21 NOTE — Telephone Encounter (Signed)
Attempted to call patient with results.  His wife answered attempted to call his cell phone unable to leave voicemail.  We will send letter.

## 2021-03-25 ENCOUNTER — Emergency Department (HOSPITAL_COMMUNITY)
Admission: EM | Admit: 2021-03-25 | Discharge: 2021-03-25 | Disposition: A | Discharge status: Home / Self Care | Payer: BC Managed Care – PPO | Attending: Emergency Medicine | Admitting: Emergency Medicine

## 2021-03-25 ENCOUNTER — Telehealth: Payer: Self-pay | Admitting: Family Medicine

## 2021-03-25 ENCOUNTER — Other Ambulatory Visit: Payer: Self-pay

## 2021-03-25 ENCOUNTER — Emergency Department (HOSPITAL_COMMUNITY): Payer: BC Managed Care – PPO

## 2021-03-25 ENCOUNTER — Encounter (HOSPITAL_COMMUNITY): Payer: Self-pay

## 2021-03-25 ENCOUNTER — Emergency Department (HOSPITAL_COMMUNITY)
Admission: EM | Admit: 2021-03-25 | Discharge: 2021-03-26 | Disposition: A | Discharge status: Home / Self Care | Payer: BC Managed Care – PPO | Source: Home / Self Care | Attending: Emergency Medicine | Admitting: Emergency Medicine

## 2021-03-25 DIAGNOSIS — F410 Panic disorder [episodic paroxysmal anxiety] without agoraphobia: Secondary | ICD-10-CM | POA: Insufficient documentation

## 2021-03-25 DIAGNOSIS — J80 Acute respiratory distress syndrome: Secondary | ICD-10-CM | POA: Diagnosis not present

## 2021-03-25 DIAGNOSIS — J449 Chronic obstructive pulmonary disease, unspecified: Secondary | ICD-10-CM | POA: Diagnosis not present

## 2021-03-25 DIAGNOSIS — Z7951 Long term (current) use of inhaled steroids: Secondary | ICD-10-CM | POA: Diagnosis not present

## 2021-03-25 DIAGNOSIS — R059 Cough, unspecified: Secondary | ICD-10-CM | POA: Diagnosis not present

## 2021-03-25 DIAGNOSIS — J441 Chronic obstructive pulmonary disease with (acute) exacerbation: Secondary | ICD-10-CM | POA: Diagnosis not present

## 2021-03-25 DIAGNOSIS — F17210 Nicotine dependence, cigarettes, uncomplicated: Secondary | ICD-10-CM | POA: Diagnosis not present

## 2021-03-25 DIAGNOSIS — R2241 Localized swelling, mass and lump, right lower limb: Secondary | ICD-10-CM | POA: Diagnosis not present

## 2021-03-25 DIAGNOSIS — R069 Unspecified abnormalities of breathing: Secondary | ICD-10-CM | POA: Diagnosis not present

## 2021-03-25 DIAGNOSIS — J45909 Unspecified asthma, uncomplicated: Secondary | ICD-10-CM | POA: Insufficient documentation

## 2021-03-25 DIAGNOSIS — R0602 Shortness of breath: Secondary | ICD-10-CM | POA: Diagnosis not present

## 2021-03-25 DIAGNOSIS — Z5321 Procedure and treatment not carried out due to patient leaving prior to being seen by health care provider: Secondary | ICD-10-CM | POA: Insufficient documentation

## 2021-03-25 DIAGNOSIS — R062 Wheezing: Secondary | ICD-10-CM | POA: Diagnosis not present

## 2021-03-25 DIAGNOSIS — Z20822 Contact with and (suspected) exposure to covid-19: Secondary | ICD-10-CM | POA: Insufficient documentation

## 2021-03-25 DIAGNOSIS — R6889 Other general symptoms and signs: Secondary | ICD-10-CM | POA: Diagnosis not present

## 2021-03-25 DIAGNOSIS — J45901 Unspecified asthma with (acute) exacerbation: Secondary | ICD-10-CM

## 2021-03-25 DIAGNOSIS — M79661 Pain in right lower leg: Secondary | ICD-10-CM | POA: Insufficient documentation

## 2021-03-25 DIAGNOSIS — M7989 Other specified soft tissue disorders: Secondary | ICD-10-CM

## 2021-03-25 DIAGNOSIS — R0902 Hypoxemia: Secondary | ICD-10-CM | POA: Diagnosis not present

## 2021-03-25 DIAGNOSIS — Z743 Need for continuous supervision: Secondary | ICD-10-CM | POA: Diagnosis not present

## 2021-03-25 LAB — COMPREHENSIVE METABOLIC PANEL
ALT: 21 U/L (ref 0–44)
AST: 24 U/L (ref 15–41)
Albumin: 3.7 g/dL (ref 3.5–5.0)
Alkaline Phosphatase: 86 U/L (ref 38–126)
Anion gap: 5 (ref 5–15)
BUN: 23 mg/dL — ABNORMAL HIGH (ref 6–20)
CO2: 24 mmol/L (ref 22–32)
Calcium: 8.6 mg/dL — ABNORMAL LOW (ref 8.9–10.3)
Chloride: 109 mmol/L (ref 98–111)
Creatinine, Ser: 0.75 mg/dL (ref 0.61–1.24)
GFR, Estimated: >60 mL/min (ref >60)
Glucose, Bld: 101 mg/dL — ABNORMAL HIGH (ref 70–99)
Potassium: 3.8 mmol/L (ref 3.5–5.1)
Sodium: 138 mmol/L (ref 135–145)
Total Bilirubin: 0.3 mg/dL (ref 0.3–1.2)
Total Protein: 7 g/dL (ref 6.5–8.1)

## 2021-03-25 LAB — CBC WITH DIFFERENTIAL/PLATELET
Abs Immature Granulocytes: 0.05 10*3/uL (ref 0.00–0.07)
Basophils Absolute: 0.1 10*3/uL (ref 0.0–0.1)
Basophils Relative: 1 %
Eosinophils Absolute: 1 10*3/uL — ABNORMAL HIGH (ref 0.0–0.5)
Eosinophils Relative: 7 %
HCT: 40.9 % (ref 39.0–52.0)
Hemoglobin: 12.9 g/dL — ABNORMAL LOW (ref 13.0–17.0)
Immature Granulocytes: 0 %
Lymphocytes Relative: 31 %
Lymphs Abs: 4.2 10*3/uL — ABNORMAL HIGH (ref 0.7–4.0)
MCH: 29.4 pg (ref 26.0–34.0)
MCHC: 31.5 g/dL (ref 30.0–36.0)
MCV: 93.2 fL (ref 80.0–100.0)
Monocytes Absolute: 0.7 10*3/uL (ref 0.1–1.0)
Monocytes Relative: 5 %
Neutro Abs: 7.6 10*3/uL (ref 1.7–7.7)
Neutrophils Relative %: 56 %
Platelets: 149 10*3/uL — ABNORMAL LOW (ref 150–400)
RBC: 4.39 MIL/uL (ref 4.22–5.81)
RDW: 13 % (ref 11.5–15.5)
WBC: 13.6 10*3/uL — ABNORMAL HIGH (ref 4.0–10.5)
nRBC: 0 % (ref 0.0–0.2)

## 2021-03-25 LAB — RESP PANEL BY RT-PCR (FLU A&B, COVID) ARPGX2
Influenza A by PCR: NEGATIVE
Influenza B by PCR: NEGATIVE
SARS Coronavirus 2 by RT PCR: NEGATIVE

## 2021-03-25 MED ORDER — AZITHROMYCIN 250 MG PO TABS: ORAL_TABLET | ORAL | 0 refills | Status: DC

## 2021-03-25 MED ORDER — PREDNISONE 20 MG PO TABS: 40.0000 mg | ORAL_TABLET | Freq: Every day | ORAL | 0 refills | Status: AC

## 2021-03-25 NOTE — ED Notes (Signed)
Pt IV removed request per pt.

## 2021-03-25 NOTE — ED Triage Notes (Signed)
Patient arrived by [facility] for asthma attack. Patient received albuterol 10mg  prior to arrival. Patient with clear lungs and no distress, smoker

## 2021-03-25 NOTE — Telephone Encounter (Signed)
**  After Hours/ Emergency Line Call**  Received a call to report that Kevin Fitzgerald that he has a worsening chronic cough.  Called EMS this morning and received nebulizer.  They took him to the ED, chest xray done but not seen by provider. Patient left after chest xray.  He takes Incruse Ellipta once a day.  Had to increase Albuterol for rescue inhaler.  Endorsing increased cough and sputum production for 2-3 days.  Denying any fevers, sick contacts, runny nose, chest pain, shortness of breath.  Recommended that he get COVID tested to rule out, although he reports receiving 3 doses vaccine.  Prednisone 40 mg daily x 5 day.  Zithromax 500 mg x1 then 250 mg daily for 4 days.  Continue Albuterol 2 puffs q4h x 24 hrs.  Scheduled appointment for follow up in clinic May 24 at 1050 am.  Red flags discussed and advised to go to ED if symptoms worsen.  Will forward to PCP.  Chest xray shows  Dana Allan MD PGY-2, [facility] 03/25/2021 5:34 PM

## 2021-03-25 NOTE — ED Notes (Signed)
Pt left labels with this NT and left

## 2021-03-25 NOTE — ED Provider Notes (Signed)
Emergency Medicine Provider Triage Evaluation Note  Kevin Fitzgerald , a 60 y.o. male  was evaluated in triage.  Pt complains of wheezing since Friday, wheezing more on left per EMS, given duoneb and solumedrol, now with diffuse wheezing and now with productive cough. sats initially 92-94%, now 100% Hx HTN, not on meds.  Brought in by EMS, taken to [facility] this morning but put in triage and left after waiting.  CXR done at [facility] this morning.   Review of Systems  Positive: Wheezing, cough, SHOB Negative: CP, fever  Physical Exam  There were no vitals taken for this visit. Gen:   Awake, appears uncomfortable, able to speak in short sentences  Resp:  tachypenic  MSK:   Moves extremities without difficulty  Other:  Diffuse wheezing   Medical Decision Making  Medically screening exam initiated at 7:08 PM.  Appropriate orders placed.  Kevin Fitzgerald was informed that the remainder of the evaluation will be completed by another provider, this initial triage assessment does not replace that evaluation, and the importance of remaining in the ED until their evaluation is complete.     Jeannie Fend, PA-C 03/25/21 1914    Arby Barrette, MD 04/09/21 1048

## 2021-03-25 NOTE — ED Triage Notes (Signed)
Pt presents via [facility] from home, c/o SOB x 4 months getting progressively worse. Went to [facility] earlier but left prior to being seen. Given 125mg  solumedrol and 2 duonebs with EMS with some improvement. Pt also c/o R leg pain/tightness

## 2021-03-26 ENCOUNTER — Ambulatory Visit (HOSPITAL_COMMUNITY)
Admission: RE | Admit: 2021-03-26 | Discharge: 2021-03-26 | Disposition: A | Discharge status: Home / Self Care | Payer: BC Managed Care – PPO | Source: Ambulatory Visit | Attending: Student | Admitting: Student

## 2021-03-26 DIAGNOSIS — M7989 Other specified soft tissue disorders: Secondary | ICD-10-CM

## 2021-03-26 DIAGNOSIS — M79661 Pain in right lower leg: Secondary | ICD-10-CM | POA: Insufficient documentation

## 2021-03-26 DIAGNOSIS — M79604 Pain in right leg: Secondary | ICD-10-CM

## 2021-03-26 LAB — D-DIMER, QUANTITATIVE: D-Dimer, Quant: 0.42 ug/mL-FEU (ref 0.00–0.50)

## 2021-03-26 MED ORDER — IPRATROPIUM-ALBUTEROL 0.5-2.5 (3) MG/3ML IN SOLN
3.0000 mL | Freq: Once | RESPIRATORY_TRACT | Status: AC
  Administered 2021-03-26: 3 mL via RESPIRATORY_TRACT
  Filled 2021-03-26: qty 3

## 2021-03-26 NOTE — Discharge Instructions (Addendum)
Your doctor has called in the medications below for you which you should start taking in the morning. Prednisone 40 mg daily x 5 day.   Zithromax 500 mg x1 then 250 mg daily for 4 days.    Continue Albuterol 2 puffs q4h x 24 hrs.    Given the pain and swelling in your right leg I have also ordered an outpatient DVT ultrasound, please follow instructions to return to have this study done, if this is negative you will need to follow-up with your PCP for continued evaluation of right leg pain.  Your chest x-ray did not show any pneumonia and your COVID test was negative today.  If shortness of breath persist please follow-up closely with your primary care doctor.  Return for new or worsening symptoms.

## 2021-03-26 NOTE — ED Notes (Signed)
Neb complete

## 2021-03-26 NOTE — Progress Notes (Signed)
Lower extremity venous has been completed.   Preliminary results in CV Proc.   Blanch Media 03/26/2021 11:22 AM

## 2021-03-26 NOTE — ED Notes (Signed)
Patient ambulated around the room and O2 remained between 97-100%. Patient also states he feels better after having the neb and is ready to go home.

## 2021-03-26 NOTE — ED Provider Notes (Signed)
[facility]-EMERGENCY DEPT Provider Note   CSN: [id number] Arrival date & time: 03/25/21  1858     History Chief Complaint  Patient presents with  . Shortness of Breath    Kevin Fitzgerald is a 60 y.o. male.  Kevin Fitzgerald is a 60 y.o. male with hx of asthma, COPD, tobacco use, arthritis, hyperlipidemia, who presents with worsening shortness of breath. Pt reports over the past 4 months he has had progressively worsening shortness of breath with increasing episodes of wheezing and cough. He does continue to smoke. Cough is intermittently productive, but no fevers. No asscoaited chest pain. Went to [employer] cones earlier today but left prior to being seen due to wait times, did have chest x-ray, that showed hyperinflation consistent with COPD/astham, but no pneumonia or edema. Does report he has been using his inhaler at home with temporary improvement. Received two duonebs and solumedrol with EMS and reports significant improvement in symptoms. No O2 requirement at home. Does report he has noticed pain and some intermittent swelling in his right lower leg without injury, has not had this addressed.  The history is provided by the patient and medical records.       Past Medical History:  Diagnosis Date  . Arthritis    knee  . Asthma    as child  . Hand fracture, right   . Hypercholesteremia     Patient Active Problem List   Diagnosis Date Noted  . Knee osteoarthritis 01/20/2021  . Asthma   . Acute exacerbation of COPD with asthma (HCC) 02/14/2020  . Hyperlipemia 07/02/2018  . Erectile dysfunction 05/16/2017  . Tobacco use disorder 05/16/2017  . MDD (major depressive disorder) 09/12/2015    Past Surgical History:  Procedure Laterality Date  . FINGER ARTHRODESIS Right 12/13/2015   Procedure: RIGHT 4TH AND 5TH CARPOMETACARPAL ARTHRODESIS;  Surgeon: Tarry Kos, MD;  Location: [facility];  Service: Orthopedics;  Laterality: Right;  .  TRANSURETHRAL RESECTION OF PROSTATE N/A 09/22/2013   Procedure: TRANSURETHRAL RESECTION OF THE PROSTATE WITH GYRUS INSTRUMENTS;  Surgeon: Sebastian Ache, MD;  Location: [facility];  Service: Urology;  Laterality: N/A;       Family History  Problem Relation Age of Onset  . Cancer Mother   . Asthma Mother   . Stroke Mother   . Diabetes Mother   . Heart attack Father   . Diabetes Brother   . Asthma Brother   . Birth defects Brother   . Heart disease Brother     Social History   Tobacco Use  . Smoking status: Current Every Day Smoker    Packs/day: 1.00    Years: 35.00    Pack years: 35.00    Types: Cigarettes  . Smokeless tobacco: Never Used  Vaping Use  . Vaping Use: Never used  Substance Use Topics  . Alcohol use: Yes    Comment: occ  . Drug use: No    Home Medications Prior to Admission medications   Medication Sig Start Date End Date Taking? Authorizing Provider  albuterol (VENTOLIN HFA) 108 (90 Base) MCG/ACT inhaler Inhale 2 puffs into the lungs every 6 (six) hours as needed for wheezing or shortness of breath. 03/09/21   Westley Chandler, MD  atorvastatin (LIPITOR) 40 MG tablet Take 1 tablet (40 mg total) by mouth daily. 03/09/21   Westley Chandler, MD  azithromycin (ZITHROMAX) 250 MG tablet Take 2 tablets on day one then 1 tablet for 4 days 03/25/21  Dana Allan, MD  diclofenac Sodium (VOLTAREN) 1 % GEL Use four times daily as needed for right knee pain 03/09/21   Westley Chandler, MD  naproxen (NAPROSYN) 500 MG tablet Take 1 tablet (500 mg total) by mouth 2 (two) times daily with a meal. 03/09/21   Westley Chandler, MD  predniSONE (DELTASONE) 20 MG tablet Take 2 tablets (40 mg total) by mouth daily with breakfast for 5 days. 03/25/21 03/30/21  Dana Allan, MD  tadalafil (CIALIS) 10 MG tablet Take 1 tablet (10 mg total) by mouth daily as needed for erectile dysfunction. 03/09/21   Westley Chandler, MD  umeclidinium bromide (INCRUSE ELLIPTA) 62.5 MCG/INH AEPB Inhale 1 puff into the lungs  every morning. 03/09/21   Westley Chandler, MD    Allergies    Patient has no known allergies.  Review of Systems   Review of Systems  Constitutional: Negative for chills and fever.  HENT: Negative.   Respiratory: Positive for cough, shortness of breath and wheezing.   Cardiovascular: Negative for chest pain and palpitations.  Gastrointestinal: Negative for abdominal pain, nausea and vomiting.  Genitourinary: Negative for dysuria.  Musculoskeletal: Positive for myalgias. Negative for arthralgias and joint swelling.  Skin: Negative for color change and rash.  Neurological: Negative for dizziness, syncope and light-headedness.  All other systems reviewed and are negative.   Physical Exam Updated Vital Signs BP (!) 154/92   Pulse 87   Temp 98 F (36.7 C) (Oral)   Resp (!) 26   Ht  (1.676 m)   Wt 70.3 kg   SpO2 99%   BMI 25.02 kg/m   Physical Exam Vitals and nursing note reviewed.  Constitutional:      General: He is not in acute distress.    Appearance: He is well-developed. He is not ill-appearing or diaphoretic.  HENT:     Head: Normocephalic and atraumatic.  Eyes:     General:        Right eye: No discharge.        Left eye: No discharge.     Pupils: Pupils are equal, round, and reactive to light.  Cardiovascular:     Rate and Rhythm: Normal rate and regular rhythm.     Heart sounds: Normal heart sounds.  Pulmonary:     Effort: Pulmonary effort is normal. No respiratory distress.     Breath sounds: Decreased breath sounds and wheezing present. No rales.     Comments: Respirations equal and unlabored, satting well on room air, able to speak in full sentences, on auscultation there is some decreased sir movement bilaterally, with faint expiratory wheezing Abdominal:     General: Bowel sounds are normal. There is no distension.     Palpations: Abdomen is soft. There is no mass.     Tenderness: There is no abdominal tenderness. There is no guarding.     Comments:  Abdomen soft, nondistended, nontender to palpation in all quadrants without guarding or peritoneal signs   Musculoskeletal:        General: No deformity.     Cervical back: Neck supple.     Comments: Right lower leg with mild tenderness and very slight swelling when compared to left, no erythema or warmth, distal pulses 2+  Skin:    General: Skin is warm and dry.     Capillary Refill: Capillary refill takes less than 2 seconds.  Neurological:     Mental Status: He is alert.     Coordination: Coordination  normal.     Comments: Speech is clear, able to follow commands Moves extremities without ataxia, coordination intact  Psychiatric:        Mood and Affect: Mood normal.        Behavior: Behavior normal.     ED Results / Procedures / Treatments   Labs (all labs ordered are listed, but only abnormal results are displayed) Labs Reviewed  COMPREHENSIVE METABOLIC PANEL - Abnormal; Notable for the following components:      Result Value   Glucose, Bld 101 (*)    BUN 23 (*)    Calcium 8.6 (*)    All other components within normal limits  CBC WITH DIFFERENTIAL/PLATELET - Abnormal; Notable for the following components:   WBC 13.6 (*)    Hemoglobin 12.9 (*)    Platelets 149 (*)    Lymphs Abs 4.2 (*)    Eosinophils Absolute 1.0 (*)    All other components within normal limits  RESP PANEL BY RT-PCR (FLU A&B, COVID) ARPGX2  D-DIMER, QUANTITATIVE    EKG EKG Interpretation  Date/Time:  Monday Mar 26 2021 01:30:28 EDT Ventricular Rate:  78 PR Interval:  189 QRS Duration: 96 QT Interval:  392 QTC Calculation: 447 R Axis:   70 Text Interpretation: Sinus rhythm Abnormal R-wave progression, early transition ST elevation, consider inferior injury No significant change was found Confirmed by Paula Libra ([id number]) on 03/26/2021 2:12:46 AM   Radiology LE VENOUS  Result Date: 03/26/2021  Lower Venous DVT Study Patient Name:  Kevin Fitzgerald  Date of Exam:   03/26/2021 Medical Rec #:  [id number]         Accession #:    [id number] Date of Birth: 03-13-1961         Patient Gender: M Patient Age:   58Y Exam Location:  [facility] Procedure:      VAS [place] LOWER EXTREMITY VENOUS (DVT) Referring Phys: [id number] Asberry Lascola N Meliya Mcconahy --------------------------------------------------------------------------------  Indications: Swelling, and Pain.  Comparison Study: no prior Performing Technologist: Blanch Media RVS  Examination Guidelines: A complete evaluation includes B-mode imaging, spectral Doppler, color Doppler, and power Doppler as needed of all accessible portions of each vessel. Bilateral testing is considered an integral part of a complete examination. Limited examinations for reoccurring indications may be performed as noted. The reflux portion of the exam is performed with the patient in reverse Trendelenburg.  +---------+---------------+---------+-----------+----------+--------------+ RIGHT    CompressibilityPhasicitySpontaneityPropertiesThrombus Aging +---------+---------------+---------+-----------+----------+--------------+ CFV      Full           Yes      Yes                                 +---------+---------------+---------+-----------+----------+--------------+ SFJ      Full                                                        +---------+---------------+---------+-----------+----------+--------------+ FV Prox  Full                                                        +---------+---------------+---------+-----------+----------+--------------+ FV Mid  Full                                                        +---------+---------------+---------+-----------+----------+--------------+ FV DistalFull                                                        +---------+---------------+---------+-----------+----------+--------------+ PFV      Full                                                         +---------+---------------+---------+-----------+----------+--------------+ POP      Full           Yes      Yes                                 +---------+---------------+---------+-----------+----------+--------------+ PTV      Full                                                        +---------+---------------+---------+-----------+----------+--------------+ PERO     Full                                                        +---------+---------------+---------+-----------+----------+--------------+   +----+---------------+---------+-----------+----------+--------------+ LEFTCompressibilityPhasicitySpontaneityPropertiesThrombus Aging +----+---------------+---------+-----------+----------+--------------+ CFV Full           Yes      Yes                                 +----+---------------+---------+-----------+----------+--------------+     Summary: RIGHT: - There is no evidence of deep vein thrombosis in the lower extremity.  - No cystic structure found in the popliteal fossa.  LEFT: - No evidence of common femoral vein obstruction.  *See table(s) above for measurements and observations. Electronically signed by Sherald Hesshristopher Clark MD on 03/26/2021 at 3:32:51 PM.    Final      Procedures Procedures   Medications Ordered in ED Medications  ipratropium-albuterol (DUONEB) 0.5-2.5 (3) MG/3ML nebulizer solution 3 mL (3 mLs Nebulization Given 03/26/21 0136)    ED Course  I have reviewed the triage vital signs and the nursing notes.  Pertinent labs & imaging results that were available during my care of the patient were reviewed by me and considered in my medical decision making (see chart for details).    MDM Rules/Calculators/A&P                          60 year old male presents with 4 months of progressively worsening shortness of breath,  increasing episodes of wheezing and cough.  History of asthma and COPD, continues to use cigarettes.  Received 2 duo nebs and  steroids with EMS prior to arrival and symptoms significantly improved.  Also reports some pain and intermittent swelling in the right lower leg that has been persistent throughout course of worsening shortness of breath.  Highest suspicion is for asthma/COPD exacerbation, but given lower leg pain and swelling, concern patient could have underlying DVT or PE leading to worsening shortness of breath.  Had chest x-ray done at [facility] that showed no evidence of pneumonia or pulmonary edema, low suspicion for CHF contributing.  EKG unremarkable.  Will check basic labs and D-dimer and give additional DuoNeb here as lung sounds are improved but still a bit diminished.  I have independently ordered, reviewed and interpreted all labs and imaging: CBC: Mild leukocytosis of 13.6,  CMP: No significant electrolyte derangements D-dimer: Negative  Discussed reassuring lab results with patient.  He is feeling much improved.  Lungs clear with better air movement.  Patient's PCP called in a 5-day course of prednisone as well as azithromycin for patient while he was waiting.  I encouraged him to take this and follow-up closely as planned.  We will also schedule patient for outpatient DVT study tomorrow morning given right lower leg pain.  Patient expresses understanding and agreement with this plan.  Return precautions provided.  Discharged home in good condition.  Final Clinical Impression(s) / ED Diagnoses Final diagnoses:  Acute exacerbation of COPD with asthma (HCC)  Pain and swelling of right lower leg    Rx / DC Orders ED Discharge Orders         Ordered    LE VENOUS        03/26/21 0245           Dartha Lodge, PA-C 03/29/21 0931    Paula Libra, MD 03/31/21 2232

## 2021-03-27 ENCOUNTER — Ambulatory Visit (INDEPENDENT_AMBULATORY_CARE_PROVIDER_SITE_OTHER): Payer: BC Managed Care – PPO | Admitting: Family Medicine

## 2021-03-27 ENCOUNTER — Other Ambulatory Visit: Payer: Self-pay

## 2021-03-27 VITALS — BP 120/60 | HR 85 | Ht 66.0 in | Wt 161.4 lb

## 2021-03-27 DIAGNOSIS — Z122 Encounter for screening for malignant neoplasm of respiratory organs: Secondary | ICD-10-CM | POA: Diagnosis not present

## 2021-03-27 DIAGNOSIS — J441 Chronic obstructive pulmonary disease with (acute) exacerbation: Secondary | ICD-10-CM

## 2021-03-27 DIAGNOSIS — J45901 Unspecified asthma with (acute) exacerbation: Secondary | ICD-10-CM

## 2021-03-27 NOTE — Progress Notes (Signed)
    SUBJECTIVE:   CHIEF COMPLAINT / HPI:   COPD exacerbation [name] has been experiencing a COPD exacerbation for about the past week had some trouble breathing proceed in the emergency room.  Late last week, he presented to the [facility] emergency room by EMS and ultimately stayed in the waiting room all day without being seen.  He left without being assessed that day.  On Sunday night, he again was brought to the emergency room at [facility] by EMS.  At that time, he was seen and a chest x-ray showed no evidence of pneumonia.  He was discharged emergency room with a 5-day course of prednisone, azithromycin and an albuterol and umeclidinium inhaler.  Today in clinic he reports that he feels slightly improved since he started taking his medication yesterday.  He does not yet feel returned to baseline and quickly becomes short of breath with exertion.  He denies any chest discomfort.  He is not having significant sputum production or increased sputum purulence.  He is a current smoker.  He started smoking when he was roughly 60 years old and has smoked roughly 1 pack/day.  He is interested in smoking cessation and in fact, stopped smoking 2 days ago.  He is not interested in nicotine replacement therapy at this time.   PERTINENT  PMH / PSH: Clinical COPD, current smoker  OBJECTIVE:   BP 120/60   Pulse 85   Ht 5\' 6"  (1.676 m)   Wt 161 lb 6 oz (73.2 kg)   SpO2 98%   BMI 26.05 kg/m    General: Alert and cooperative and appears to be in no acute distress Cardio: Normal S1 and S2, no S3 or S4. Rhythm is regular. No murmurs or rubs.   Pulm: Breathing comfortably on room air for our conversation.  No evidence of significant respiratory distress.  Poor air movement in lungs bilaterally without evidence of wheezing.  No significant respiratory distress.  Saturating well on room air. Abdomen: Bowel sounds normal. Abdomen soft and non-tender.  Extremities: No peripheral edema. Warm/ well  perfused.  Strong radial pulses. Neuro: Cranial nerves grossly intact  ASSESSMENT/PLAN:   Acute exacerbation of COPD with asthma (HCC) After a somewhat frustrating course in the emergency room, he has been placed on an appropriate medication for COPD exacerbation.  He was encouraged to continue his current medication and to follow-up with Dr. at his next appointment on 6/1.  His most recent CBC with differential demonstrates elevated eosinophils, he may benefit from a daily inhaled corticosteroid although I am hesitant to start him on an inhaled steroid daily because he has not previously been hospitalized for COPD.  I think a daily LAMA for now is appropriate. -Continue prednisone -Continue azithromycin -Continue umeclidinium every day even after he completes his steroid course -Continue albuterol as needed for wheezing or shortness of breath -Low-dose lung CT ordered for lung cancer screening -He may benefit from PFTs in the future although treating him empirically for COPD would also be appropriate.     8/1, MD [facility]

## 2021-03-27 NOTE — Patient Instructions (Addendum)
I'm sorry that you had such a difficult time being assessed and treated for this COPD exacerbation.  I do think that you are currently on good medicine and I just need a little bit more time to feel better.  Is a quick summary of the things we talked about: -Continue your prednisone 40 mg for total of 5 days -Continue your azithromycin (antibiotic) for a total of 5 days -Continue taking your Incruse Ellipta (umeclidinium) every day even once to get better -Continue to use your albuterol inhaler when you feel wheezy or short of breath  It looks like you already have a follow-up appointment with your PCP on 1 June.  I think this will be good timing to make sure that you have continued to get better.  It may be helpful to address the possibility of pulmonary function test at that time and may be additional medications.  We will help you get set up with appropriate lung cancer screening today.

## 2021-03-27 NOTE — Assessment & Plan Note (Addendum)
After a somewhat frustrating course in the emergency room, he has been placed on an appropriate medication for COPD exacerbation.  He was encouraged to continue his current medication and to follow-up with Dr. Mauri Reading at his next appointment on 6/1.  His most recent CBC with differential demonstrates elevated eosinophils, he may benefit from a daily inhaled corticosteroid although I am hesitant to start him on an inhaled steroid daily because he has not previously been hospitalized for COPD.  I think a daily LAMA for now is appropriate. -Continue prednisone -Continue azithromycin -Continue umeclidinium every day even after he completes his steroid course -Continue albuterol as needed for wheezing or shortness of breath -Low-dose lung CT ordered for lung cancer screening -He may benefit from PFTs in the future although treating him empirically for COPD would also be appropriate.

## 2021-04-03 NOTE — Progress Notes (Signed)
   Subjective:   Patient ID: Kevin Fitzgerald    DOB: 12-04-1960, 60 y.o. male   MRN: [id number]  Kevin Fitzgerald is a 60 y.o. male with a history of COPD, asthma, knee osteoarthritis, ED, HLD, MDD, tobacco use disorder here for   Acute Concerns: SOB  H/o Asthma  Tobacco Abuse: Concern for COPD. Recently treated for COPD exacerbation which included Prednisone and antibiotic. He is currently on Incruse daily and albuterol as needed. Notes his SOB is worse at night. Coughs at night more. Has been needing his albuterol daily recently. Feels he needs to be on chronic oral steroids and antibiotics because these help a lot.  Health Maintenance: Due for colon cancer screening, COVID vaccine, and Shingles vaccine  Review of Systems:  Per HPI.   Objective:   BP 104/82   Pulse 81   Wt 157 lb 6.4 oz (71.4 kg)   SpO2 96%   BMI 25.41 kg/m  Vitals and nursing note reviewed.  General: pleasant thinner older male, sitting comfortably on exam bed, well nourished, well developed, in no acute distress with non-toxic appearance CV: regular rate and rhythm without murmurs, rubs, or gallops Lungs: scattered scant wheezes on left, clear on right, long expiratory phase, normal work of breathing on room air, speaking in full sentences MSK:  gait normal Neuro: Alert and oriented, speech normal  Assessment & Plan:   Acute exacerbation of COPD with asthma (HCC) Recent COPD exacerbation treated with steroids, antibiotics, and stared on LAMA. His symptoms have improved but are still not well controlled with Incruse alone. Uses albuterol daily.  - Start Trelegy daily - If not covered by insurance, will send in Symbicort and continue Incruse - Scheduled appointment with Dr. Raymondo Band for PFT's - encouraged smoking cessation   Health Maintenance: Due for colon cancer screening, COVID vaccine, and Shingles vaccine - declined all three. Strongly encouraged and discussed importance but patient was persistent in  declining. Will continue to encourage at follow up visits.   No orders of the defined types were placed in this encounter.  Meds ordered this encounter  Medications  . Fluticasone-Umeclidin-Vilant (TRELEGY ELLIPTA) 100-62.5-25 MCG/INH AEPB    Sig: Inhale 1 puff into the lungs daily.    Dispense:  60 each    Refill:  1      Orpah Cobb, DO PGY-3, [facility] 04/05/2021 11:39 AM

## 2021-04-04 ENCOUNTER — Ambulatory Visit (INDEPENDENT_AMBULATORY_CARE_PROVIDER_SITE_OTHER): Payer: BC Managed Care – PPO | Admitting: Family Medicine

## 2021-04-04 ENCOUNTER — Encounter: Payer: Self-pay | Admitting: Family Medicine

## 2021-04-04 ENCOUNTER — Other Ambulatory Visit: Payer: Self-pay

## 2021-04-04 VITALS — BP 104/82 | HR 81 | Wt 157.4 lb

## 2021-04-04 DIAGNOSIS — J441 Chronic obstructive pulmonary disease with (acute) exacerbation: Secondary | ICD-10-CM | POA: Diagnosis not present

## 2021-04-04 DIAGNOSIS — J45901 Unspecified asthma with (acute) exacerbation: Secondary | ICD-10-CM

## 2021-04-04 MED ORDER — TRELEGY ELLIPTA 100-62.5-25 MCG/INH IN AEPB: 1.0000 | INHALATION_SPRAY | Freq: Every day | RESPIRATORY_TRACT | 1 refills | Status: DC

## 2021-04-04 NOTE — Patient Instructions (Signed)
Start Trelegy (3 medicines in one inhaler). 1 puff daily every day no matter what Stop the Incruse Call me If insurance doesn't cover the Trelegy and I will call in other inhaler.  Follow up on 6/21 for pulmonary function testing Follow up sooner if symptoms worsen  Follow up in 1 month with doctor to see how things are going with new inhalers.

## 2021-04-05 NOTE — Assessment & Plan Note (Addendum)
Recent COPD exacerbation treated with steroids, antibiotics, and stared on LAMA. His symptoms have improved but are still not well controlled with Incruse alone. Uses albuterol daily.  - Start Trelegy daily - If not covered by insurance, will send in Symbicort and continue Incruse - Scheduled appointment with Dr. Raymondo Band for PFT's - encouraged smoking cessation

## 2021-04-12 ENCOUNTER — Other Ambulatory Visit: Payer: Self-pay

## 2021-04-12 ENCOUNTER — Ambulatory Visit
Admission: RE | Admit: 2021-04-12 | Discharge: 2021-04-12 | Disposition: A | Discharge status: Home / Self Care | Payer: BC Managed Care – PPO | Source: Ambulatory Visit | Attending: Family Medicine | Admitting: Family Medicine

## 2021-04-12 DIAGNOSIS — N281 Cyst of kidney, acquired: Secondary | ICD-10-CM | POA: Diagnosis not present

## 2021-04-12 DIAGNOSIS — I700 Atherosclerosis of aorta: Secondary | ICD-10-CM | POA: Diagnosis not present

## 2021-04-12 DIAGNOSIS — Z122 Encounter for screening for malignant neoplasm of respiratory organs: Secondary | ICD-10-CM

## 2021-04-12 DIAGNOSIS — F17210 Nicotine dependence, cigarettes, uncomplicated: Secondary | ICD-10-CM | POA: Diagnosis not present

## 2021-04-12 DIAGNOSIS — I2510 Atherosclerotic heart disease of native coronary artery without angina pectoris: Secondary | ICD-10-CM | POA: Diagnosis not present

## 2021-04-24 ENCOUNTER — Other Ambulatory Visit: Payer: Self-pay

## 2021-04-24 ENCOUNTER — Encounter: Payer: Self-pay | Admitting: Pharmacist

## 2021-04-24 ENCOUNTER — Ambulatory Visit (INDEPENDENT_AMBULATORY_CARE_PROVIDER_SITE_OTHER): Payer: BC Managed Care – PPO | Admitting: Pharmacist

## 2021-04-24 DIAGNOSIS — J4520 Mild intermittent asthma, uncomplicated: Secondary | ICD-10-CM | POA: Diagnosis not present

## 2021-04-24 DIAGNOSIS — F17200 Nicotine dependence, unspecified, uncomplicated: Secondary | ICD-10-CM

## 2021-04-24 MED ORDER — TRELEGY ELLIPTA 200-62.5-25 MCG/INH IN AEPB: 1.0000 | INHALATION_SPRAY | Freq: Every day | RESPIRATORY_TRACT | 11 refills | Status: DC

## 2021-04-24 NOTE — Assessment & Plan Note (Addendum)
History of Asthma with mild obstruction:  Patient has been experiencing SOB and taking Trelegy Ellipta. Reports previously using Primatene Mist. Spirometry evaluation pre-bronchodilator reveals normal lung function (Last dose of Trelegy this morning). Nebulizer not administered and repeat PFT not performed.  -New Rx for Trelegy ellipta, increasing dose of fluticasone, (Fluticasone/Umeclindin/Vilant) 200/62.5/25 mcg/puff 1 puff daily sent in. -Educated patient on purpose, proper use, potential adverse effects including risk of esophageal candidiasis and need to rinse mouth after each use.   -Reviewed results of pulmonary function tests.  Pt verbalized understanding of results and education.

## 2021-04-24 NOTE — Patient Instructions (Addendum)
It was very nice meeting you today! From your visit today, please:  START taking Trelegy Ellipta (200-62.5-25). STOP taking Trelegy Ellipta (100-62.5-25).   Today's lung function test showed normal lung function for your age when you take your inhalers every day as directed.

## 2021-04-24 NOTE — Progress Notes (Signed)
Reviewed: I agree with Dr. Koval's documentation and management. 

## 2021-04-24 NOTE — Progress Notes (Signed)
S:  Patient arrives ambulating independently and in good spirits. Patient arrives for evaluation/assistance with tobacco dependence.   Patient was referred and last seen by Primary Care Provider on 04/04/2021.   Patient reports breathing has been improved since starting Trelegy Ellipta.   Medication adherence reported to be good, takes inhaler daily Patient reports last dose of COPD medications was this morning Current COPD medications: Trelegy Ellipta (100/62.5/25) Rescue inhaler use frequency: Not taking - discarded since use of Trelegy initiated.  Patient exacerbation hx: Most recent exacerbation 03/25/21  Age when started using tobacco on a daily basis 19 Brand smoked [place]ports. In the past, patient smoked 1 pack per day, now smoking only 1 cigarette per day. Estimated nicotine content per cigarette (mg) 1.7 mg.  Estimated nicotine intake per day >1mg .    Fagerstrom Score <5  Most recent quit attempt 3 years ago. Longest time ever been tobacco free 2 years.  Medications used in past cessation efforts include: varenicline  Rates IMPORTANCE of quitting tobacco on 1-10 scale of 10.  Most common triggers to use tobacco include; Stress   Motivation to quit: wants to see grandchildren graduate  O: Physical Exam Constitutional:      Appearance: Normal appearance.  Pulmonary:     Effort: Pulmonary effort is normal.  Neurological:     Mental Status: He is alert.  Psychiatric:        Behavior: Behavior normal.    Review of Systems  Respiratory:  Positive for shortness of breath.    Vitals:   04/24/21 0843  BP: (!) 143/90  Pulse: 85  SpO2: 99%    See "scanned report" or Documentation Flowsheet (discrete results - PFTs) for Spirometry results. Patient provided good effort while attempting spirometry.   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    Provided information on 1 800-QUIT NOW support program.  Quit date planned for tomorrow 04/25/2021 Phone follow-up to assess progress/success in 1 week.    History of Asthma and Mild Obstruction:  Patient has been experiencing SOB and taking Trelegy Ellipta. Reports previously using Primatene Mist. Spirometry evaluation pre-bronchodilator reveals normal lung function (Last dose of Trelegy this morning). Nebulizer not administered and repeat PFT not performed.  -New Rx for Trelegy ellipta, increasing dose of fluticasone, (Fluticasone/Umeclindin/Vilant) 200/62.5/25 mcg/puff 1 puff daily sent in. -Educated patient on purpose, proper use, potential adverse effects including risk of esophageal candidiasis and need to rinse mouth after each use.   -Reviewed results of pulmonary function tests.  Pt verbalized understanding of results and education.     Written information provided.  F/U phone call 05/01/21.  Total time in face-to-face counseling 40 minutes.  Patient seen with Tomie China PharmD Candidate, and Kinnie Feil, PharmD - PGY-1 Resident.

## 2021-04-24 NOTE — Assessment & Plan Note (Signed)
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    Provided information on 1 800-QUIT NOW support program.  Quit date planned for tomorrow 04/25/2021 Phone follow-up to assess progress/success in 1 week.

## 2021-05-02 ENCOUNTER — Telehealth: Payer: Self-pay | Admitting: Pharmacist

## 2021-05-02 NOTE — Telephone Encounter (Signed)
Attempted to contact patient  for follow/up of tobacco intake reduction / tobacco cessation attempt.   No answer / No VM set-up on cell phone Home - not there.   F/U Phone call planned: 1 week.

## 2021-05-02 NOTE — Telephone Encounter (Signed)
-----   Message from Kathrin Ruddy, RPH-CPP sent at 04/24/2021  9:58 AM EDT ----- Regarding: Tobacco Cessation - 6/22 - Followup

## 2021-05-08 ENCOUNTER — Telehealth: Payer: Self-pay | Admitting: Pharmacist

## 2021-05-08 NOTE — Telephone Encounter (Signed)
Attempted phone follow-up of tobacco cessation efforts.   No answer, unable to leave a message.   I will try again later in the week.

## 2021-05-08 NOTE — Telephone Encounter (Signed)
-----   Message from Kathrin Ruddy, RPH-CPP sent at 05/02/2021 11:19 AM EDT ----- Regarding: Tobacco Cessation follow-up form 04/25/2021

## 2021-05-11 ENCOUNTER — Telehealth: Payer: Self-pay | Admitting: Pharmacist

## 2021-05-11 NOTE — Telephone Encounter (Signed)
-----   Message from Kathrin Ruddy, RPH-CPP sent at 05/08/2021  2:43 PM EDT ----- Regarding: Tobacco Cessation F/U fropm 6/22

## 2021-05-11 NOTE — Telephone Encounter (Signed)
Attempted to contact patient for follow-up of tobacco intake reduction / cessation.   No voice mail available.  Plan to attempt follow-up again in 2 weeks.

## 2021-05-29 ENCOUNTER — Telehealth: Payer: Self-pay | Admitting: Pharmacist

## 2021-05-29 DIAGNOSIS — F17200 Nicotine dependence, unspecified, uncomplicated: Secondary | ICD-10-CM

## 2021-05-29 NOTE — Assessment & Plan Note (Signed)
Patient contacted for follow/up of tobacco intake reduction / tobacco cessation attempt.   Since last contact patient reports he reduced to 1 cigarette per day AND has not smoked today.  He has no remaining cigarettes and is trying to remain quit.    Medications currently being used; None  Total time with patient call and documentation of interaction: 9 minutes.  F/U Phone call planned: 2 weeks

## 2021-05-29 NOTE — Telephone Encounter (Signed)
Patient contacted for follow/up of tobacco intake reduction / tobacco cessation attempt.   Since last contact patient reports he reduced to 1 cigarette per day AND has not smoked today.  He has no remaining cigarettes and is trying to remain quit.    Medications currently being used; None  Total time with patient call and documentation of interaction: 9 minutes.  F/U Phone call planned: 2 weeks 

## 2021-05-29 NOTE — Telephone Encounter (Signed)
-----   Message from Kathrin Ruddy, RPH-CPP sent at 05/11/2021  2:08 PM EDT ----- Regarding: Tobacco Cessation F/U

## 2021-05-30 NOTE — Telephone Encounter (Signed)
Noted and agree. 

## 2021-06-14 ENCOUNTER — Telehealth: Payer: Self-pay | Admitting: Pharmacist

## 2021-06-14 DIAGNOSIS — J4520 Mild intermittent asthma, uncomplicated: Secondary | ICD-10-CM

## 2021-06-14 MED ORDER — TRELEGY ELLIPTA 200-62.5-25 MCG/INH IN AEPB: 1.0000 | INHALATION_SPRAY | Freq: Every day | RESPIRATORY_TRACT | 11 refills | Status: DC

## 2021-06-14 NOTE — Telephone Encounter (Signed)
-----   Message from Kathrin Ruddy, RPH-CPP sent at 05/29/2021 12:13 PM EDT ----- Regarding: Quit smoking

## 2021-06-14 NOTE — Telephone Encounter (Signed)
Noted and agree. 

## 2021-06-14 NOTE — Telephone Encounter (Signed)
Patient contacted for follow/up of tobacco intake reduction / tobacco cessation attempt.   Since last contact patient reports smoking on 1/2 cig per day.  He also requested a refill on his Trelegy inhaler.  He stated his breathing is much improved.    Medications currently being used; None. Rates CONFIDENCE of quitting tobacco at a very high level.  Encouraged patient to target complete cessation in the near future.  Congratulated on success with cutting back.   Total time with patient call and documentation of interaction: 13 minutes.  F/U Phone call planned: 2 weeks.

## 2021-06-22 ENCOUNTER — Emergency Department (HOSPITAL_COMMUNITY): Payer: BC Managed Care – PPO

## 2021-06-22 ENCOUNTER — Other Ambulatory Visit: Payer: Self-pay

## 2021-06-22 ENCOUNTER — Encounter (HOSPITAL_COMMUNITY): Payer: Self-pay | Admitting: Emergency Medicine

## 2021-06-22 ENCOUNTER — Emergency Department (HOSPITAL_COMMUNITY)
Admission: EM | Admit: 2021-06-22 | Discharge: 2021-06-22 | Disposition: A | Discharge status: Home / Self Care | Payer: BC Managed Care – PPO | Attending: Emergency Medicine | Admitting: Emergency Medicine

## 2021-06-22 DIAGNOSIS — R062 Wheezing: Secondary | ICD-10-CM | POA: Diagnosis not present

## 2021-06-22 DIAGNOSIS — Z79899 Other long term (current) drug therapy: Secondary | ICD-10-CM | POA: Diagnosis not present

## 2021-06-22 DIAGNOSIS — J449 Chronic obstructive pulmonary disease, unspecified: Secondary | ICD-10-CM | POA: Insufficient documentation

## 2021-06-22 DIAGNOSIS — F17210 Nicotine dependence, cigarettes, uncomplicated: Secondary | ICD-10-CM | POA: Diagnosis not present

## 2021-06-22 DIAGNOSIS — R0602 Shortness of breath: Secondary | ICD-10-CM | POA: Diagnosis not present

## 2021-06-22 DIAGNOSIS — J4541 Moderate persistent asthma with (acute) exacerbation: Secondary | ICD-10-CM | POA: Insufficient documentation

## 2021-06-22 LAB — BASIC METABOLIC PANEL
Anion gap: 7 (ref 5–15)
BUN: 14 mg/dL (ref 6–20)
CO2: 28 mmol/L (ref 22–32)
Calcium: 9.6 mg/dL (ref 8.9–10.3)
Chloride: 104 mmol/L (ref 98–111)
Creatinine, Ser: 0.92 mg/dL (ref 0.61–1.24)
GFR, Estimated: >60 mL/min (ref >60)
Glucose, Bld: 100 mg/dL — ABNORMAL HIGH (ref 70–99)
Potassium: 3.7 mmol/L (ref 3.5–5.1)
Sodium: 139 mmol/L (ref 135–145)

## 2021-06-22 LAB — CBC WITH DIFFERENTIAL/PLATELET
Abs Immature Granulocytes: 0.02 10*3/uL (ref 0.00–0.07)
Basophils Absolute: 0.1 10*3/uL (ref 0.0–0.1)
Basophils Relative: 1 %
Eosinophils Absolute: 0.8 10*3/uL — ABNORMAL HIGH (ref 0.0–0.5)
Eosinophils Relative: 9 %
HCT: 42.5 % (ref 39.0–52.0)
Hemoglobin: 14.2 g/dL (ref 13.0–17.0)
Immature Granulocytes: 0 %
Lymphocytes Relative: 38 %
Lymphs Abs: 3.4 10*3/uL (ref 0.7–4.0)
MCH: 30.1 pg (ref 26.0–34.0)
MCHC: 33.4 g/dL (ref 30.0–36.0)
MCV: 90 fL (ref 80.0–100.0)
Monocytes Absolute: 0.7 10*3/uL (ref 0.1–1.0)
Monocytes Relative: 8 %
Neutro Abs: 4.1 10*3/uL (ref 1.7–7.7)
Neutrophils Relative %: 44 %
Platelets: 137 10*3/uL — ABNORMAL LOW (ref 150–400)
RBC: 4.72 MIL/uL (ref 4.22–5.81)
RDW: 12.6 % (ref 11.5–15.5)
WBC: 9.1 10*3/uL (ref 4.0–10.5)
nRBC: 0 % (ref 0.0–0.2)

## 2021-06-22 LAB — TROPONIN I (HIGH SENSITIVITY): Troponin I (High Sensitivity): 4 ng/L (ref <18)

## 2021-06-22 MED ORDER — PREDNISONE 20 MG PO TABS: ORAL_TABLET | ORAL | 0 refills | Status: DC

## 2021-06-22 MED ORDER — PREDNISONE 20 MG PO TABS
60.0000 mg | ORAL_TABLET | Freq: Once | ORAL | Status: AC
  Administered 2021-06-22: 60 mg via ORAL
  Filled 2021-06-22: qty 3

## 2021-06-22 MED ORDER — ALBUTEROL SULFATE HFA 108 (90 BASE) MCG/ACT IN AERS
4.0000 | INHALATION_SPRAY | Freq: Once | RESPIRATORY_TRACT | Status: AC
  Administered 2021-06-22: 4 via RESPIRATORY_TRACT
  Filled 2021-06-22: qty 6.7

## 2021-06-22 MED ORDER — ALBUTEROL SULFATE (2.5 MG/3ML) 0.083% IN NEBU
5.0000 mg | INHALATION_SOLUTION | Freq: Once | RESPIRATORY_TRACT | Status: AC
  Administered 2021-06-22: 5 mg via RESPIRATORY_TRACT
  Filled 2021-06-22: qty 6

## 2021-06-22 NOTE — ED Provider Notes (Signed)
Emergency Medicine Provider Triage Evaluation Note  Kevin Fitzgerald , a 60 y.o. male  was evaluated in triage.  Pt complains of SOB and wheezing.  Has been coughing a bit, clear mucous.  Denies hemoptysis or fever.  No sick contacts.  Has hx of asthma and COPD.  Review of Systems  Positive: SOB, wheezing Negative: fever  Physical Exam  BP (!) 153/100 (BP Location: Right Arm)   Pulse (!) 105   Temp 98.9 F (37.2 C)   Resp (!) 22   SpO2 97%  Gen:   Awake, no distress   Resp:  Normal effort, expiratory wheezes noted MSK:   Moves extremities without difficulty  Other:    Medical Decision Making  Medically screening exam initiated at 5:52 AM.  Appropriate orders placed.  Kevin Fitzgerald was informed that the remainder of the evaluation will be completed by another provider, this initial triage assessment does not replace that evaluation, and the importance of remaining in the ED until their evaluation is complete.  Suspect more respiratory etiology than cardiac.  Does have some wheezing on exam here.  Will order neb treatment.  Work-up pending.   Garlon Hatchet, PA-C 06/22/21 3716    Geoffery Lyons, MD 06/22/21 [phone]

## 2021-06-22 NOTE — ED Provider Notes (Signed)
[facility] EMERGENCY DEPARTMENT Provider Note   CSN: [id number] Arrival date & time: 06/22/21  5631     History Chief Complaint  Patient presents with   Asthma    Kevin Fitzgerald is a 60 y.o. male.  61 yo M with a cc of sob.  Going on for about 48 hours.  Feels like his prior asthma.  Usually happens a few times a year.  He has his medicines but they are quite old and have been refilled in a while.  Has tried to use them but without improvement.  His grandchildren are sick but he has not feel that he is exceptionally ill.  The history is provided by the patient.  Asthma This is a new problem. The current episode started 2 days ago. The problem occurs constantly. The problem has not changed since onset.Associated symptoms include shortness of breath. Pertinent negatives include no chest pain, no abdominal pain and no headaches. Nothing aggravates the symptoms. Nothing relieves the symptoms. He has tried nothing for the symptoms. The treatment provided no relief.      Past Medical History:  Diagnosis Date   Arthritis    knee   Asthma    as child   Hand fracture, right    Hypercholesteremia     Patient Active Problem List   Diagnosis Date Noted   Knee osteoarthritis 01/20/2021   Asthma    Acute exacerbation of COPD with asthma (HCC) 02/14/2020   Hyperlipemia 07/02/2018   Erectile dysfunction 05/16/2017   Tobacco use disorder 05/16/2017   MDD (major depressive disorder) 09/12/2015    Past Surgical History:  Procedure Laterality Date   FINGER ARTHRODESIS Right 12/13/2015   Procedure: RIGHT 4TH AND 5TH CARPOMETACARPAL ARTHRODESIS;  Surgeon: Tarry Kos, MD;  Location: [facility];  Service: Orthopedics;  Laterality: Right;   TRANSURETHRAL RESECTION OF PROSTATE N/A 09/22/2013   Procedure: TRANSURETHRAL RESECTION OF THE PROSTATE WITH GYRUS INSTRUMENTS;  Surgeon: Sebastian Ache, MD;  Location: [facility];  Service: Urology;  Laterality: N/A;        Family History  Problem Relation Age of Onset   Cancer Mother    Asthma Mother    Stroke Mother    Diabetes Mother    Heart attack Father    Diabetes Brother    Asthma Brother    Birth defects Brother    Heart disease Brother     Social History   Tobacco Use   Smoking status: Every Day    Packs/day: 0.10    Years: 35.00    Pack years: 3.50    Types: Cigarettes   Smokeless tobacco: Never   Tobacco comments:    Previously 1 ppd smoker - Newports - Quit many times for > 3 months.   Vaping Use   Vaping Use: Never used  Substance Use Topics   Alcohol use: Yes    Comment: occ   Drug use: No    Home Medications Prior to Admission medications   Medication Sig Start Date End Date Taking? Authorizing Provider  predniSONE (DELTASONE) 20 MG tablet 2 tabs po daily x 4 days 06/22/21  Yes Melene Plan, DO  acetaminophen (TYLENOL) 500 MG tablet Take 500 mg by mouth every 6 (six) hours as needed for moderate pain.    [provider]  albuterol (VENTOLIN HFA) 108 (90 Base) MCG/ACT inhaler Inhale 2 puffs into the lungs every 6 (six) hours as needed for wheezing or shortness of breath. Patient not taking:  No sig reported 03/09/21   Westley Chandler, MD  Ascorbic Acid (VITAMIN C PO) Take 1 tablet by mouth daily.    [provider]  cholecalciferol (VITAMIN D3) 25 MCG (1000 UNIT) tablet Take 1,000 Units by mouth daily.    [provider]  diclofenac Sodium (VOLTAREN) 1 % GEL Use four times daily as needed for right knee pain 03/09/21   Westley Chandler, MD  EPINEPHrine (PRIMATENE MIST) 0.125 MG/ACT AERO Inhale into the lungs. Patient not taking: Reported on 04/24/2021    [provider]  Fluticasone-Umeclidin-Vilant (TRELEGY ELLIPTA) 200-62.5-25 MCG/INH AEPB Inhale 1 puff into the lungs daily. 06/14/21   Kathrin Ruddy, RPH-CPP  naproxen (NAPROSYN) 500 MG tablet Take 1 tablet (500 mg total) by mouth 2 (two) times daily with a meal. Patient not taking:  Reported on 04/24/2021 03/09/21   Westley Chandler, MD  OVER THE COUNTER MEDICATION Take 1 capsule by mouth daily.    [provider]  tadalafil (CIALIS) 10 MG tablet Take 1 tablet (10 mg total) by mouth daily as needed for erectile dysfunction. 03/09/21   Westley Chandler, MD  vitamin B-12 (CYANOCOBALAMIN) 100 MCG tablet Take 100 mcg by mouth daily.    [provider]  VITAMIN E PO Take 1 tablet by mouth daily.    [provider]    Allergies    Patient has no known allergies.  Review of Systems   Review of Systems  Constitutional:  Negative for chills and fever.  HENT:  Negative for congestion and facial swelling.   Eyes:  Negative for discharge and visual disturbance.  Respiratory:  Positive for cough, shortness of breath and wheezing.   Cardiovascular:  Negative for chest pain and palpitations.  Gastrointestinal:  Negative for abdominal pain, diarrhea and vomiting.  Musculoskeletal:  Negative for arthralgias and myalgias.  Skin:  Negative for color change and rash.  Neurological:  Negative for tremors, syncope and headaches.  Psychiatric/Behavioral:  Negative for confusion and dysphoric mood.    Physical Exam Updated Vital Signs BP (!) 153/100 (BP Location: Right Arm)   Pulse (!) 105   Temp 98.9 F (37.2 C)   Resp (!) 22   SpO2 97%   Physical Exam Vitals and nursing note reviewed.  Constitutional:      Appearance: He is well-developed.  HENT:     Head: Normocephalic and atraumatic.  Eyes:     Pupils: Pupils are equal, round, and reactive to light.  Neck:     Vascular: No JVD.  Cardiovascular:     Rate and Rhythm: Normal rate and regular rhythm.     Heart sounds: No murmur heard.   No friction rub. No gallop.  Pulmonary:     Effort: No respiratory distress.     Breath sounds: No wheezing.     Comments: Diminished in all fields Abdominal:     General: There is no distension.     Tenderness: There is no abdominal tenderness. There is no  guarding or rebound.  Musculoskeletal:        General: Normal range of motion.     Cervical back: Normal range of motion and neck supple.  Skin:    Coloration: Skin is not pale.     Findings: No rash.  Neurological:     Mental Status: He is alert and oriented to person, place, and time.  Psychiatric:        Behavior: Behavior normal.    ED Results / Procedures /  Treatments   Labs (all labs ordered are listed, but only abnormal results are displayed) Labs Reviewed  CBC WITH DIFFERENTIAL/PLATELET - Abnormal; Notable for the following components:      Result Value   Platelets 137 (*)    Eosinophils Absolute 0.8 (*)    All other components within normal limits  BASIC METABOLIC PANEL - Abnormal; Notable for the following components:   Glucose, Bld 100 (*)    All other components within normal limits  TROPONIN I (HIGH SENSITIVITY)    EKG EKG Interpretation  Date/Time:  Friday June 22 2021 05:41:47 EDT Ventricular Rate:  69 PR Interval:  190 QRS Duration: 96 QT Interval:  384 QTC Calculation: 411 R Axis:   76 Text Interpretation: Normal sinus rhythm with sinus arrhythmia Right atrial enlargement Minimal voltage criteria for LVH, may be normal variant ( Sokolow-Lyon ) Nonspecific T wave abnormality Abnormal ECG No significant change since last tracing Confirmed by Melene Plan [phone]) on 06/22/2021 6:01:13 AM  Radiology DG Chest 2 View  Result Date: 06/22/2021 CLINICAL DATA:  Shortness of breath and wheezing. EXAM: CHEST - 2 VIEW COMPARISON:  03/25/2021 FINDINGS: Normal heart size and mediastinal contours. No acute infiltrate or edema. No effusion or pneumothorax. No acute osseous findings. IMPRESSION: Negative chest. Electronically Signed   By: Marnee Spring M.D.   On: 06/22/2021 06:18    Procedures Procedures   Medications Ordered in ED Medications  albuterol (PROVENTIL) (2.5 MG/3ML) 0.083% nebulizer solution 5 mg (5 mg Nebulization Given 06/22/21 0628)  predniSONE  (DELTASONE) tablet 60 mg (60 mg Oral Given 06/22/21 0631)  albuterol (VENTOLIN HFA) 108 (90 Base) MCG/ACT inhaler 4 puff (4 puffs Inhalation Given 06/22/21 0347)    ED Course  I have reviewed the triage vital signs and the nursing notes.  Pertinent labs & imaging results that were available during my care of the patient were reviewed by me and considered in my medical decision making (see chart for details).    MDM Rules/Calculators/A&P                           60 yo M well-appearing and nontoxic with a chief complaint of an asthma exacerbation.  Diminished breath sounds in all fields.  Improved with DuoNeb here.  Given steroids.  PCP follow-up.  7:09 AM:  I have discussed the diagnosis/risks/treatment options with the patient and believe the pt to be eligible for discharge home to follow-up with PCP. We also discussed returning to the ED immediately if new or worsening sx occur. We discussed the sx which are most concerning (e.g., sudden worsening pain, fever, inability to tolerate by mouth) that necessitate immediate return. Medications administered to the patient during their visit and any new prescriptions provided to the patient are listed below.  Medications given during this visit Medications  albuterol (PROVENTIL) (2.5 MG/3ML) 0.083% nebulizer solution 5 mg (5 mg Nebulization Given 06/22/21 0628)  predniSONE (DELTASONE) tablet 60 mg (60 mg Oral Given 06/22/21 0631)  albuterol (VENTOLIN HFA) 108 (90 Base) MCG/ACT inhaler 4 puff (4 puffs Inhalation Given 06/22/21 4259)     The patient appears reasonably screen and/or stabilized for discharge and I doubt any other medical condition or other [facility] requiring further screening, evaluation, or treatment in the ED at this time prior to discharge.   Final Clinical Impression(s) / ED Diagnoses Final diagnoses:  Moderate persistent asthma with exacerbation    Rx / DC Orders ED Discharge Orders  Ordered    predniSONE (DELTASONE) 20  MG tablet        06/22/21 0653             Melene PlanFloyd, Cleon Thoma, DO 06/22/21 0710

## 2021-06-22 NOTE — Discharge Instructions (Addendum)
Use your inhaler every 4 hours(6 puffs) while awake, return for sudden worsening shortness of breath, or if you need to use your inhaler more often.  ° °

## 2021-06-22 NOTE — ED Triage Notes (Signed)
Patient states he was on his way to work and having a coughing episode with increased shortness of breath.  Patient with wheezing bilaterally, more on the left than right.

## 2021-06-28 ENCOUNTER — Telehealth: Payer: Self-pay | Admitting: Pharmacist

## 2021-06-28 NOTE — Telephone Encounter (Signed)
-----   Message from Kathrin Ruddy, RPH-CPP sent at 06/14/2021 10:40 AM EDT ----- Regarding: Reduced to 1/2 cig per day - Has patient quit for a single day?

## 2021-06-28 NOTE — Telephone Encounter (Signed)
Patient contacted for follow/up of tobacco intake reduction / tobacco cessation attempt.   Since last contact patient reports continued smoking of 1/2 cig per day with stress or when he is around other smokers.    Medications currently being used; None.     Motivation to quit: Breathing - dyspnea   Total time with patient call and documentation of interaction: 13 minutes.  F/U Phone call planned: 3 weeks

## 2021-07-23 ENCOUNTER — Telehealth: Payer: Self-pay | Admitting: Pharmacist

## 2021-07-23 NOTE — Telephone Encounter (Signed)
-----   Message from Kathrin Ruddy, RPH-CPP sent at 06/28/2021 11:02 AM EDT ----- Regarding: tobacco cessation - Quit?  only smoking 1/2 cig per day for last 2 months.

## 2021-07-23 NOTE — Telephone Encounter (Signed)
Attempted to contact patient for follow-up of tobacco intake reduction / cessation.    No voice mail available.  Voice mail not set up.    Total time with patient call and documentation of interaction: 4 minutes.  Additional F/U Phone call planned: 1 week

## 2021-07-30 ENCOUNTER — Telehealth: Payer: Self-pay | Admitting: Pharmacist

## 2021-07-30 NOTE — Telephone Encounter (Signed)
-----   Message from Kathrin Ruddy, RPH-CPP sent at 07/23/2021  9:54 AM EDT ----- Regarding: Tobacco Cessation - ? QUIT

## 2021-07-30 NOTE — Telephone Encounter (Signed)
Patient contacted for follow/up of tobacco intake reduction / tobacco cessation attempt.   Since last contact patient reports he has been through a lot of stress and has not make much progress on tobacco cessation.  He reports smoking only 2 cigarettes per day.  He admits that smoking helps him with stress.    Medications currently being used; None.  Rates IMPORTANCE of quitting tobacco remains high.  Most common triggers to use tobacco include; stress.    Motivation to quit: improved breathing.    Total time with patient call and documentation of interaction: 12 minutes.  F/U Phone call planned: 1 month

## 2021-08-28 ENCOUNTER — Telehealth: Payer: Self-pay | Admitting: Pharmacist

## 2021-08-28 NOTE — Telephone Encounter (Signed)
Attempted to contact patient for follow-up of tobacco intake reduction / cessation.   MObile phone was disconnected.  Called home phone - Left message to call back to [facility].   I will await his call back/  next follow-up.   Additional F/U Phone call planned: None as his direct phone number needs update.

## 2021-09-01 IMAGING — DX DG CHEST 2V
2 series · 2 of 2 positions shown · non-contrast
Comparison: 03/25/2021

CLINICAL DATA: Shortness of breath and wheezing.

EXAM:
CHEST - 2 VIEW

[chest pa]
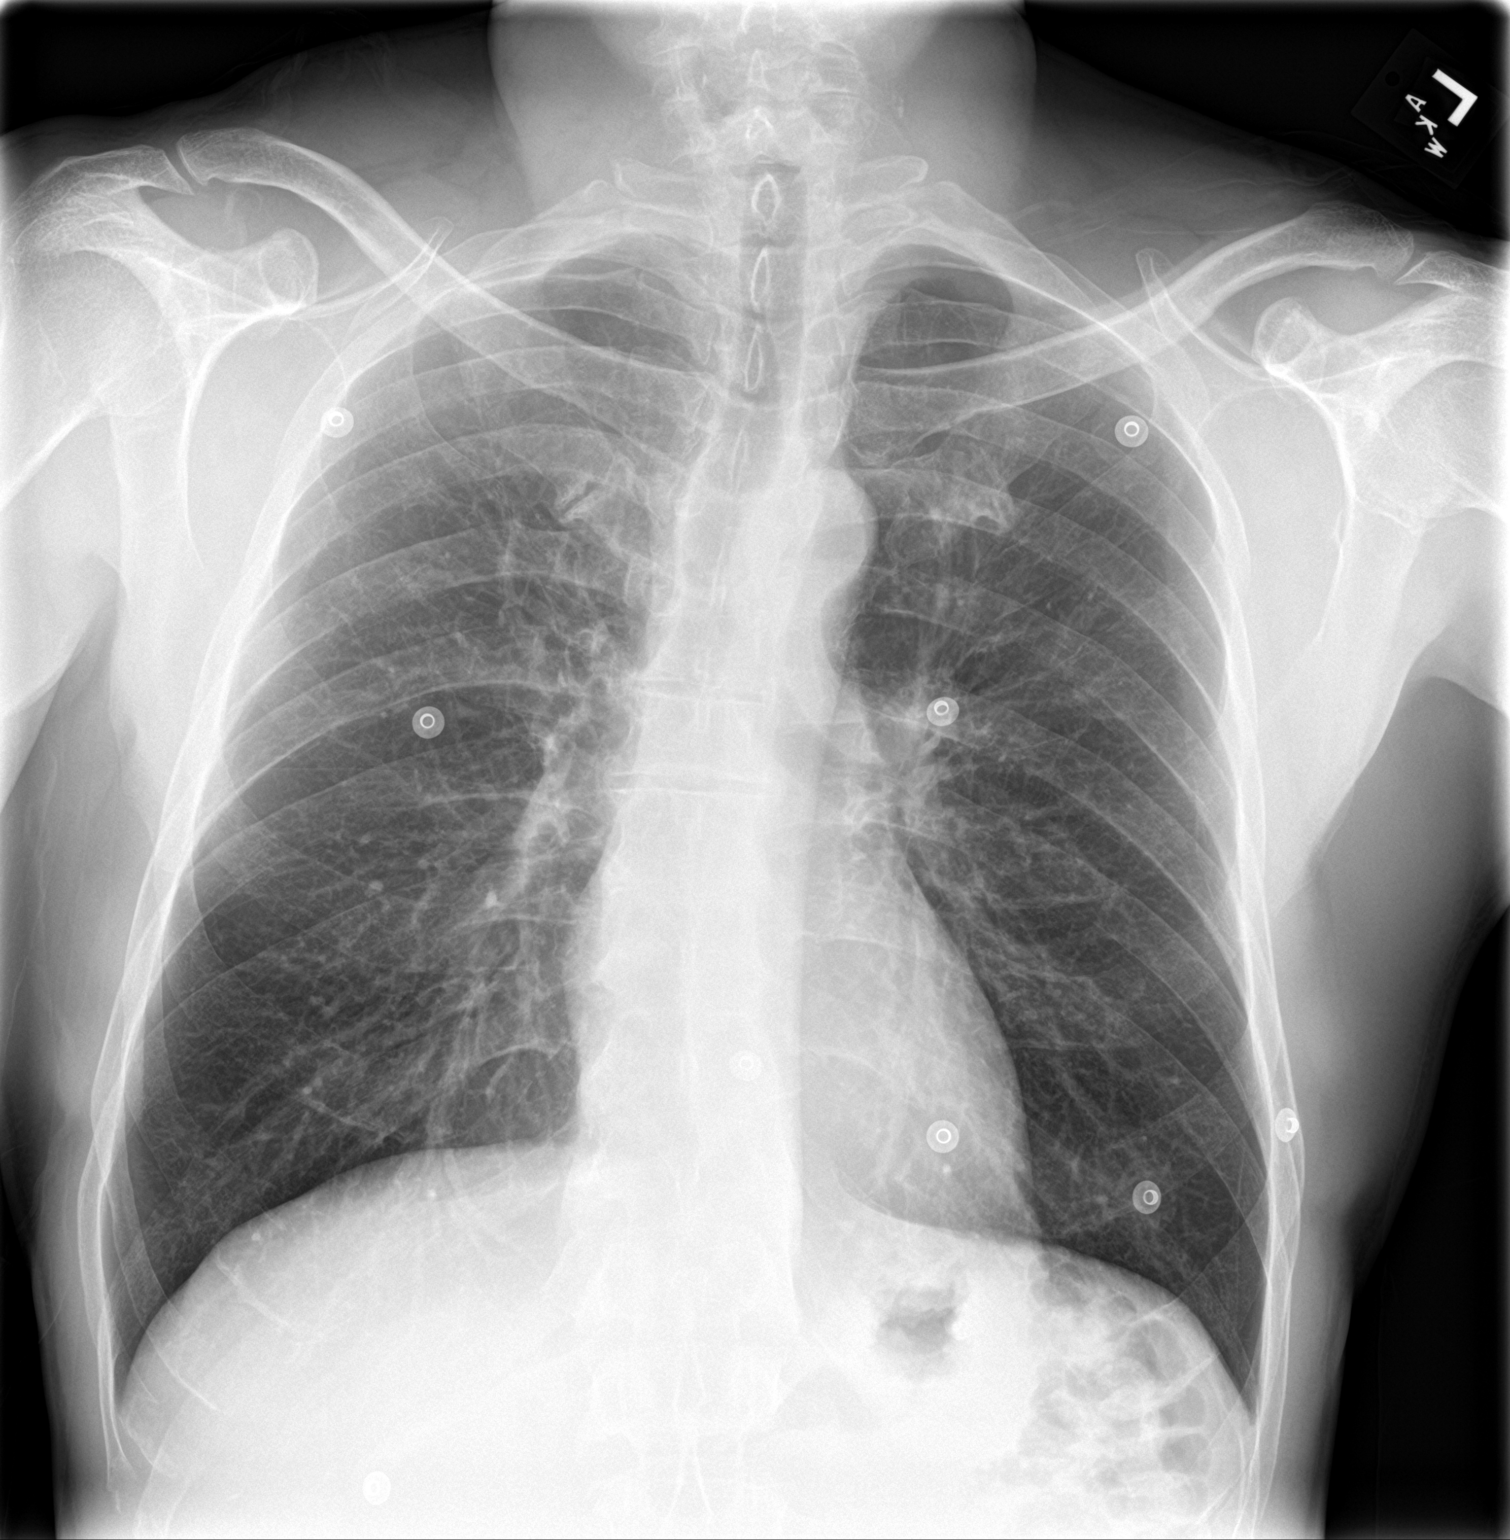

[chest lat]
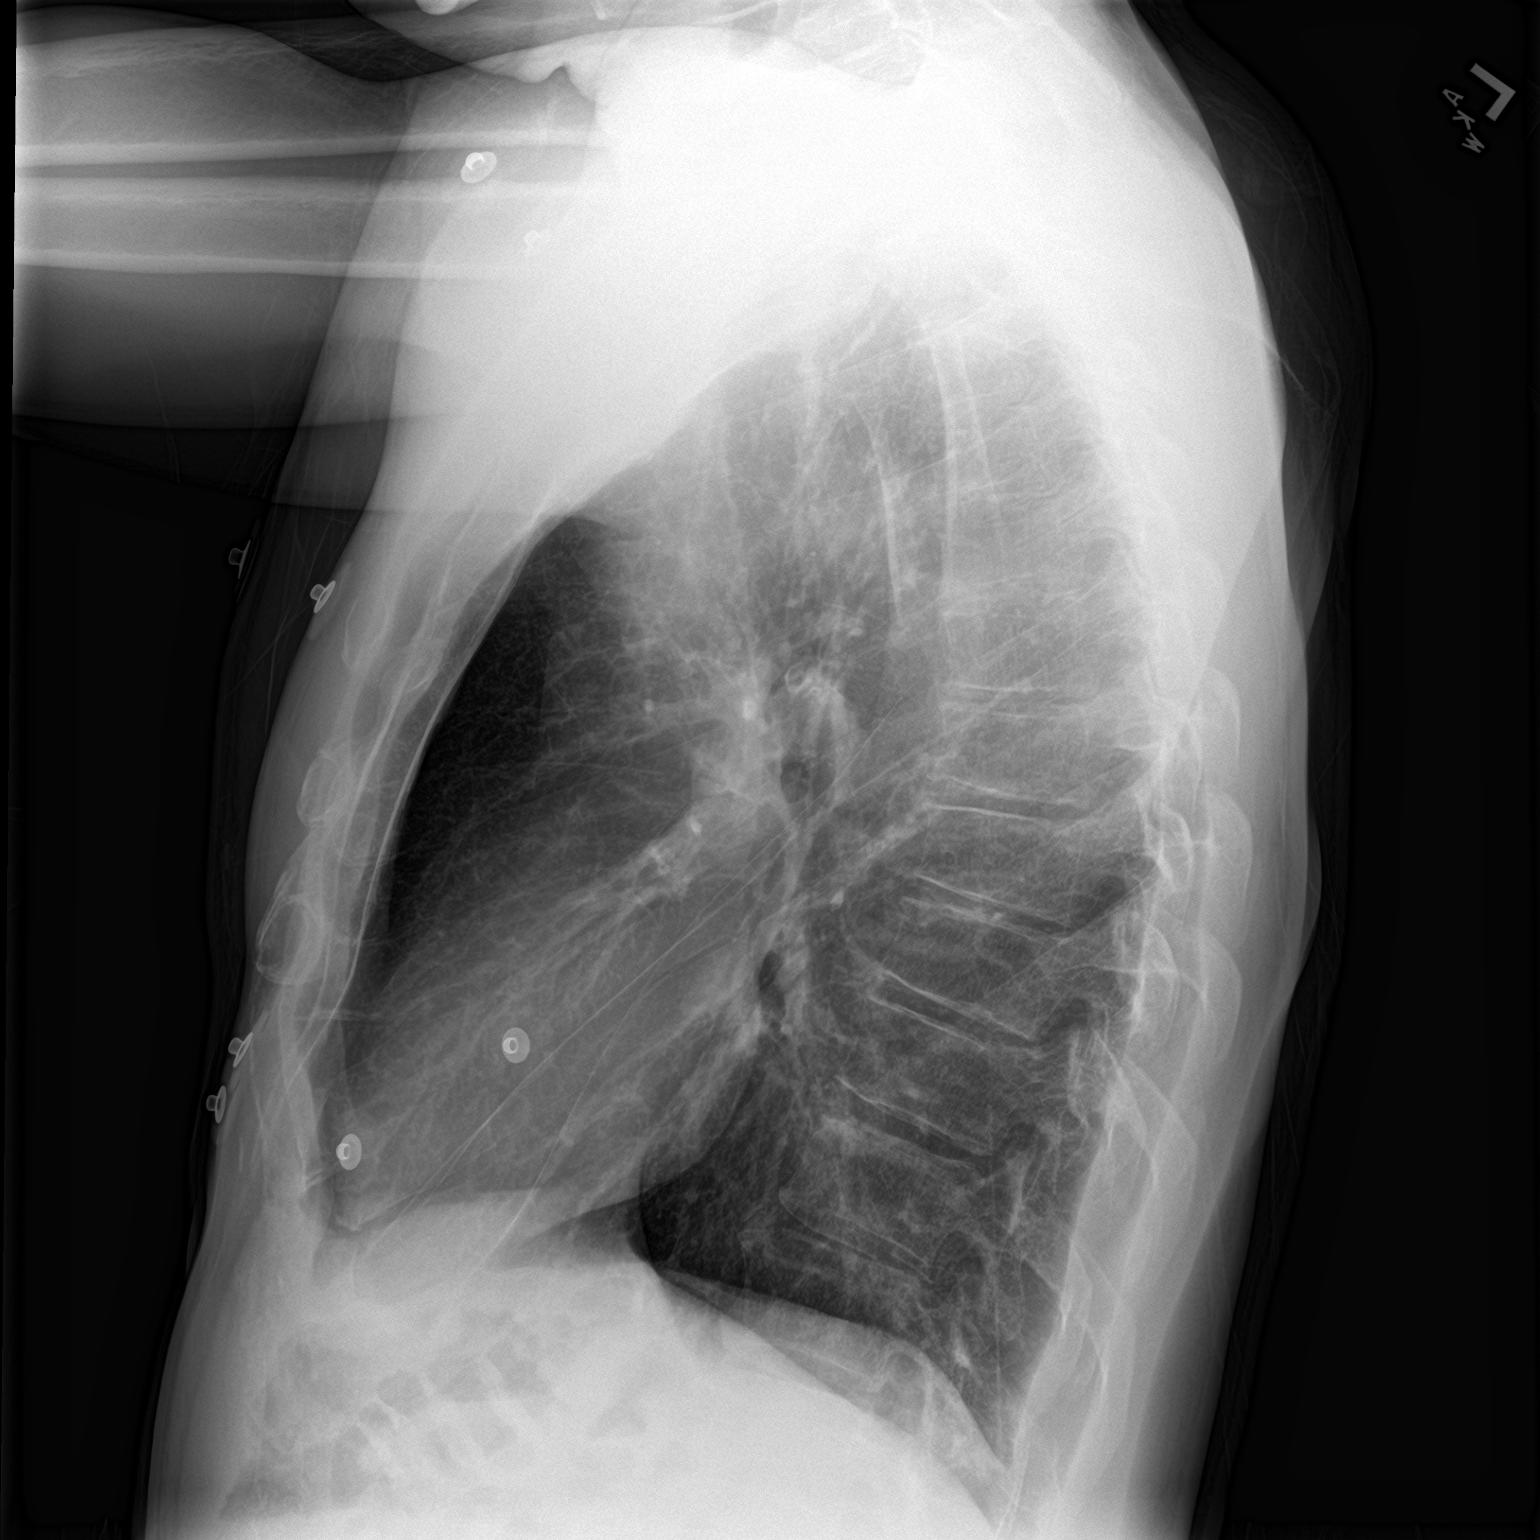

[2 of 2 positions shown; findings below may reference images not displayed]

FINDINGS: Normal heart size and mediastinal contours. No acute infiltrate or
edema. No effusion or pneumothorax. No acute osseous findings.
IMPRESSION: Negative chest.

## 2021-09-09 ENCOUNTER — Other Ambulatory Visit: Payer: Self-pay

## 2021-09-09 ENCOUNTER — Ambulatory Visit (HOSPITAL_COMMUNITY)
Admission: EM | Admit: 2021-09-09 | Discharge: 2021-09-09 | Disposition: A | Discharge status: Home / Self Care | Payer: BC Managed Care – PPO

## 2021-09-09 ENCOUNTER — Telehealth: Payer: Self-pay | Admitting: Family Medicine

## 2021-09-09 NOTE — Telephone Encounter (Signed)
**  After Hours/ Emergency Line Call**  Received a page to call Lyman Speller.  Wife states pt has lower back pain and is concerned that he keeps falling asleep.  State he has been "in and out". Pt reports ongoing lower back pain that does not change with ibuprofen (400-800 mg).  Has not used Voltaren gel. Endorses fatigue. States he had has no energy. Has fallen asleep three times today which is unsual for him.    When asked to look at his nails, he noted no bluish or purplish discoloration. Wife states they called the ED and it was a 6-hr wait and went to the Urgent care this morning but the "line was out the door." Reccommended pt be evaluated either in the ED or urgent care. Given option of free standing ED for possible reduced wait time. Red flags discussed. Wife to drive pt to the ED. Will forward to his PCP.     Katha Cabal, DO PGY-3, [facility] 09/09/2021 1:52 PM

## 2021-09-09 NOTE — ED Notes (Signed)
Called in lobby  no answer 

## 2021-09-09 NOTE — ED Triage Notes (Signed)
Called in lobby no answered.  

## 2021-09-10 ENCOUNTER — Encounter (HOSPITAL_COMMUNITY): Payer: Self-pay

## 2021-09-10 ENCOUNTER — Emergency Department (HOSPITAL_COMMUNITY)
Admission: EM | Admit: 2021-09-10 | Discharge: 2021-09-10 | Disposition: A | Discharge status: Home / Self Care | Payer: BC Managed Care – PPO | Attending: Emergency Medicine | Admitting: Emergency Medicine

## 2021-09-10 ENCOUNTER — Emergency Department (HOSPITAL_COMMUNITY): Payer: BC Managed Care – PPO

## 2021-09-10 ENCOUNTER — Other Ambulatory Visit: Payer: Self-pay

## 2021-09-10 DIAGNOSIS — R7309 Other abnormal glucose: Secondary | ICD-10-CM | POA: Insufficient documentation

## 2021-09-10 DIAGNOSIS — Z5321 Procedure and treatment not carried out due to patient leaving prior to being seen by health care provider: Secondary | ICD-10-CM | POA: Insufficient documentation

## 2021-09-10 DIAGNOSIS — M545 Low back pain, unspecified: Secondary | ICD-10-CM | POA: Insufficient documentation

## 2021-09-10 DIAGNOSIS — R5383 Other fatigue: Secondary | ICD-10-CM | POA: Diagnosis not present

## 2021-09-10 LAB — CBC WITH DIFFERENTIAL/PLATELET
Abs Immature Granulocytes: 0.01 10*3/uL (ref 0.00–0.07)
Basophils Absolute: 0.1 10*3/uL (ref 0.0–0.1)
Basophils Relative: 1 %
Eosinophils Absolute: 0.2 10*3/uL (ref 0.0–0.5)
Eosinophils Relative: 3 %
HCT: 43.7 % (ref 39.0–52.0)
Hemoglobin: 14 g/dL (ref 13.0–17.0)
Immature Granulocytes: 0 %
Lymphocytes Relative: 39 %
Lymphs Abs: 2.6 10*3/uL (ref 0.7–4.0)
MCH: 28.9 pg (ref 26.0–34.0)
MCHC: 32 g/dL (ref 30.0–36.0)
MCV: 90.1 fL (ref 80.0–100.0)
Monocytes Absolute: 1 10*3/uL (ref 0.1–1.0)
Monocytes Relative: 14 %
Neutro Abs: 2.9 10*3/uL (ref 1.7–7.7)
Neutrophils Relative %: 43 %
Platelets: 130 10*3/uL — ABNORMAL LOW (ref 150–400)
RBC: 4.85 MIL/uL (ref 4.22–5.81)
RDW: 12.6 % (ref 11.5–15.5)
WBC: 6.8 10*3/uL (ref 4.0–10.5)
nRBC: 0 % (ref 0.0–0.2)

## 2021-09-10 LAB — BASIC METABOLIC PANEL
Anion gap: 6 (ref 5–15)
BUN: 18 mg/dL (ref 6–20)
CO2: 28 mmol/L (ref 22–32)
Calcium: 9.2 mg/dL (ref 8.9–10.3)
Chloride: 103 mmol/L (ref 98–111)
Creatinine, Ser: 0.98 mg/dL (ref 0.61–1.24)
GFR, Estimated: >60 mL/min (ref >60)
Glucose, Bld: 91 mg/dL (ref 70–99)
Potassium: 4.6 mmol/L (ref 3.5–5.1)
Sodium: 137 mmol/L (ref 135–145)

## 2021-09-10 LAB — CBG MONITORING, ED: Glucose-Capillary: 117 mg/dL — ABNORMAL HIGH (ref 70–99)

## 2021-09-10 NOTE — ED Notes (Signed)
Pt not responding for vital recheck. 

## 2021-09-10 NOTE — ED Provider Notes (Signed)
Emergency Medicine Provider Triage Evaluation Note  TERRICK ALLRED , a 60 y.o. male  was evaluated in triage.  Pt complains of low back pain, patient lifted a heavy [place] and ever since then he has been having persistent pain in his low back.  Starting in the middle of his back and radiating into both buttocks, does not radiate down the legs.  Pain with walking, no numbness, weakness, loss of bowel or bladder control or saddle anesthesia.  Also reports he has been feeling generally fatigued.  Review of Systems  Positive: Back pain, fatigue Negative: Numbness, weakness, fever, abdominal pain  Physical Exam  BP 117/74 (BP Location: Right Arm)   Pulse 72   Temp 98.3 F (36.8 C) (Oral)   Resp 18   SpO2 100%  Gen:   Awake, no distress   Resp:  Normal effort  MSK:   Tenderness across the low back.  Moves extremities without difficulty  Other:  Normal strength and sensation bilateral lower extremities, 2+ DTR  Medical Decision Making  Medically screening exam initiated at 10:06 AM.  Appropriate orders placed.  JAKORY MATSUO was informed that the remainder of the evaluation will be completed by another provider, this initial triage assessment does not replace that evaluation, and the importance of remaining in the ED until their evaluation is complete.     Dartha Lodge, PA-C 09/10/21 1008    Mancel Bale, MD 09/10/21 2044

## 2021-09-10 NOTE — ED Notes (Signed)
Wrong documentation made on Kevin Fitzgerald

## 2021-09-10 NOTE — ED Triage Notes (Signed)
Pt. Stated, Im [redacted] weeks pregnant and I have ulceritis colitis. I started having diarrhea and abdominal camping for 3 weeks.

## 2021-09-10 NOTE — ED Notes (Signed)
Pt not responding for vitals  

## 2021-09-10 NOTE — ED Triage Notes (Signed)
Patient complains of lower back pain x 1 month. Reports that it started after he lifted heavy boulder 1 month ago. Also complains of general fatigue, no other associated complaints

## 2021-09-12 NOTE — Progress Notes (Signed)
SUBJECTIVE:   CHIEF COMPLAINT / HPI:   Back Pain Kevin Fitzgerald is a 60 y.o. male who presents to the clinic complaining of back pain for the last 2 months. He notes that he lifted a heavy boulder over 4 months ago, "it stopped and then it reactivated again". He went to the ED on 11/7 for this but left prior to being seen. Lumbar x-ray was performed, however, and showed mild facet sclerosis in the lower lumbar spine. He describes the pain as aching and stabbing. He has been using Ibuprofen, "when it is real excruciating", has mild relief. Pain is exacerbated when changing positions from laying to sitting/standing or sitting to standing. Walking in general also hurts. It improves when laying still. Works in [employer], does Print production planner. He hasn't worked since Monday. He was working previously but states "it was still aggrating me". The pain radiates to both buttocks but not down the legs.  Denies any saddle anesthesia, bowel or bladder incontinence, radiation down legs. He is able to walk without assistance. Has not had any falls. The pain does awaken him from sleep on occasion.    PERTINENT  PMH / PSH:  Past Medical History:  Diagnosis Date   Arthritis    knee   Asthma    as child   Hand fracture, right    Hypercholesteremia     OBJECTIVE:   BP (!) 138/96   Pulse 74   Ht 5\' 9"  (1.753 m)   Wt 159 lb (72.1 kg)   BMI 23.48 kg/m    General: NAD, pleasant, able to participate in exam Cardiac: RRR, no murmurs. Respiratory: CTAB, normal effort, No wheezes, rales or rhonchi Back:  Inspection: Unremarkable  Palpable tenderness: None. Range of Motion:  Flexion 100 deg; Extension ~10-20 deg; Side Bending to ~40 deg on left and 45 deg on right (right side was mildly better); Rotation to 45 deg bilaterally with some obvious discomfort Leg strength: Quad: 5/5 Hamstring: 5/5 Hip flexor: 5/5 Hip abductors: 5/5  Sensory change: Gross sensation intact to all lumbar and sacral dermatomes.   Reflexes: trace reflexes at patellar and achilles  Able to walk but gait was slow with some obvious discomfort Extremities: no edema or cyanosis. Skin: warm and dry, no rashes noted Neuro: alert, no obvious focal deficits Psych: Normal affect and mood  Depression screen [facility] 2/9 09/13/2021 04/04/2021 03/27/2021  Decreased Interest 0 2 1  Down, Depressed, Hopeless 1 0 0  PHQ - 2 Score 1 2 1   Altered sleeping 3 3 3   Tired, decreased energy 3 3 3   Change in appetite 0 3 0  Feeling bad or failure about yourself  1 1 1   Trouble concentrating 3 3 3   Moving slowly or fidgety/restless 2 1 1   Suicidal thoughts 2 0 0  PHQ-9 Score 15 16 12   Difficult doing work/chores Not difficult at all - -     ASSESSMENT/PLAN:   1. Lumbar back pain X-ray recently that showed mild facet sclerosis in the lower lumbar spine.  Examination today notable for decreased range of motion in most planes with obvious discomfort in all planes. He works in 03/29/2021 and this requires constant bending. He was able to work through the pain until this week. He denies any red flag symptoms including saddle anesthesia, incontinence, numbness/tingling. We discussed his recent x-ray and I showed him the images. Given his significant limitation with movement, I do believe he would benefit from physical therapy for strength and  stretching exercises.  Advised him to continue with ibuprofen for pain relief as needed, advised to take with food to every 6 hours.  We discussed other conservative measures such as heat pad and lidocaine patch to help with pain relief. - Ambulatory referral to Physical Therapy -Ibuprofen, heat pad, lidocaine patches for pain  2. Depressed mood PHQ-9 today was positive for questionable 9.  Patient states that he has times where he thinks about ending his life but it is nothing he would ever follow through with.  When he feels depressed, he often talks with his brother, his wife, the Shaune Pollack, or his mother who has  passed.  He feels that he has a good support system. He denied active or passive SI today. He thinks he would not ever want to be on medications for this.  He does not feel that he would benefit from a counselor or therapist.  I did still provide him with resources for suicide and therapy/counseling.  Advised him to return to see either me or his PCP to further discuss this as it seems that he has been dealing with these recurrent depressive symptoms for a while.  We discussed that this too could be treated and he did not need to feel this way.     Sabino Dick, DO [facility]

## 2021-09-12 NOTE — Patient Instructions (Signed)
It was wonderful to see you today.  Today we talked about:  -Your x-ray showed some arthritis in the joints of your spine. This is likely the cause of your pain. Anti-inflammatory medications, such as Ibuprofen, can help reduce the pain and inflammation. Make sure to take this with food. Ibuprofen can be taken every 6 hours. A heat pad can also help. There are lidocaine patches over the counter that can help with pain and numb the area. This may be helpful to use at night. -Physical therapy can help to improve the strength and endurance of the muscles in your lower spine.  -Continue to use your support system in times when you are feeling down. There are resources at the bottom for suicide and for therapy/counseling if you decide you want to pursue this. I think everyone could benefit from a counselor. It would be good to follow up with your primary care physician for your symptoms of depression as well!   Thank you for choosing [facility].   Please call [phone] with any questions about today's appointment.  Please be sure to schedule follow up at the front  desk before you leave today.   Sabino Dick, DO PGY-2 Family Medicine    If you are feeling suicidal or depression symptoms worsen please immediately go to:   If you are thinking about harming yourself or having thoughts of suicide, or if you know someone who is, seek help right away. If you are in crisis, make sure you are not left alone.  If someone else is in crisis, make sure he/she/they is not left alone  Call 988 OR 1-800-273-TALK  24 Hour Availability for Walk-IN services  [facility]  [address] [place], [place] [phone] Crisis [phone]    Other crisis resources:  [facility] the [employer] (Domestic Violence, Rape & Victim Assistance [phone]  [facility] [employer] [facility]    (ONLY from 8am-4pm)     [phone]  [facility] Mobile Crisis Unit (24/7)   [phone]  [place] National Suicide Hotline   [phone] Len Childs)   Therapy and Counseling Resources Most providers on this list will take Medicaid. Patients with commercial insurance or Medicare should contact their insurance company to get a list of in network providers.  [facility] [address] [place], [place] [address] [phone]  [facility]  [address] Suite [place], [place] [address]      [phone]  [facility] [address]  [place], [place] [address] [phone]  [facility] [address]  [place], [place] [address]       [phone]     [facility] (virtual only) [phone]  Jovita Kussmaul [facility] [address] [place], [place] [phone]  [facility]:  [address] [place] [phone]  [facility]:  [address] [place] [phone]  [facility] (under & uninsured) [address]   [place] [phone]    [email or web address]    [facility] [address]  [place]    [phone]  [facility] -[address]     Phone:  [phone]     [facility]-  [address]  [phone]   [facility] [address] [place], [place]  [phone]  [facility]: [address] [place], [place], [place]  [phone]   [facility] (Spanish therapist) [email or web address]  [address] [place]  [address]   [place] [address]    [phone]    [facility]   [address],  [place], [place]  [phone]   [facility]  [address] [place]  [phone]  [facility]  [address] [place], [place]        [phone]  Open Access/Walk In Clinic under & uninsured  [facility]  [address] [place], [place] [phone] Crisis [phone]  [facility],  (Spanish)   [address], [place]: [phone]) 8:30 - 12; 1 - 2:30  [facility] of the [employer],  [address] [place], [place]    ([phone]):8:30 - 12; 2 - 3PM  [facility],  [address],  [place]; [phone]):   Mon - Fri 8 AM - 5 PM  [facility] [address] [place]  MWF 12:30 to 3:00 or call to schedule an appointment  [phone]  Specific Provider options Psychology Today  [email or web address].[email or web address]/us click on find a therapist  enter your zip code left side and select or tailor a therapist for your specific need.   [facility] Provider Directory [email or web address]  (Medicaid)   Follow all drop down to find a provider  Social Support program [facility] [place] [phone] or Photo[employer]pl [address], [place], [place] Recovery support and educational   24- Hour Availability:   [facility]  [address] [place], [place] [phone] Crisis [phone]  [facility] of the [facility] [phone]  [facility]  [phone]   [facility]  [phone] (after hours)  [facility]/Mobile Crisis   [phone]  [place] National Suicide Hotline  [phone] Len Childs)  Call 911 or go to emergency room  [facility]  [phone]);  [place] and [employer]  [phone]); [place], [place], [place], [place], [place], [place], [place]

## 2021-09-13 ENCOUNTER — Other Ambulatory Visit: Payer: Self-pay

## 2021-09-13 ENCOUNTER — Ambulatory Visit (INDEPENDENT_AMBULATORY_CARE_PROVIDER_SITE_OTHER): Payer: BC Managed Care – PPO | Admitting: Family Medicine

## 2021-09-13 VITALS — BP 138/96 | HR 74 | Ht 69.0 in | Wt 159.0 lb

## 2021-09-13 DIAGNOSIS — M545 Low back pain, unspecified: Secondary | ICD-10-CM

## 2021-09-13 DIAGNOSIS — R4589 Other symptoms and signs involving emotional state: Secondary | ICD-10-CM | POA: Diagnosis not present

## 2021-10-03 ENCOUNTER — Ambulatory Visit: Payer: BC Managed Care – PPO | Attending: Family Medicine

## 2021-10-12 ENCOUNTER — Emergency Department (HOSPITAL_COMMUNITY): Payer: Self-pay

## 2021-10-12 ENCOUNTER — Emergency Department (HOSPITAL_COMMUNITY)
Admission: EM | Admit: 2021-10-12 | Discharge: 2021-10-12 | Disposition: A | Discharge status: Home / Self Care | Payer: Self-pay | Attending: Emergency Medicine | Admitting: Emergency Medicine

## 2021-10-12 ENCOUNTER — Encounter (HOSPITAL_COMMUNITY): Payer: Self-pay | Admitting: Emergency Medicine

## 2021-10-12 ENCOUNTER — Other Ambulatory Visit: Payer: Self-pay

## 2021-10-12 DIAGNOSIS — J45909 Unspecified asthma, uncomplicated: Secondary | ICD-10-CM | POA: Insufficient documentation

## 2021-10-12 DIAGNOSIS — F17210 Nicotine dependence, cigarettes, uncomplicated: Secondary | ICD-10-CM | POA: Insufficient documentation

## 2021-10-12 DIAGNOSIS — J441 Chronic obstructive pulmonary disease with (acute) exacerbation: Secondary | ICD-10-CM | POA: Insufficient documentation

## 2021-10-12 DIAGNOSIS — R0602 Shortness of breath: Secondary | ICD-10-CM

## 2021-10-12 DIAGNOSIS — J45901 Unspecified asthma with (acute) exacerbation: Secondary | ICD-10-CM

## 2021-10-12 HISTORY — DX: Chronic obstructive pulmonary disease, unspecified: J44.9

## 2021-10-12 LAB — CBC WITH DIFFERENTIAL/PLATELET
Abs Immature Granulocytes: 0.07 10*3/uL (ref 0.00–0.07)
Basophils Absolute: 0.1 10*3/uL (ref 0.0–0.1)
Basophils Relative: 1 %
Eosinophils Absolute: 1 10*3/uL — ABNORMAL HIGH (ref 0.0–0.5)
Eosinophils Relative: 8 %
HCT: 42.2 % (ref 39.0–52.0)
Hemoglobin: 13.8 g/dL (ref 13.0–17.0)
Immature Granulocytes: 1 %
Lymphocytes Relative: 38 %
Lymphs Abs: 5 10*3/uL — ABNORMAL HIGH (ref 0.7–4.0)
MCH: 29.8 pg (ref 26.0–34.0)
MCHC: 32.7 g/dL (ref 30.0–36.0)
MCV: 91.1 fL (ref 80.0–100.0)
Monocytes Absolute: 0.9 10*3/uL (ref 0.1–1.0)
Monocytes Relative: 7 %
Neutro Abs: 6.2 10*3/uL (ref 1.7–7.7)
Neutrophils Relative %: 45 %
Platelets: 134 10*3/uL — ABNORMAL LOW (ref 150–400)
RBC: 4.63 MIL/uL (ref 4.22–5.81)
RDW: 12.7 % (ref 11.5–15.5)
WBC: 13.3 10*3/uL — ABNORMAL HIGH (ref 4.0–10.5)
nRBC: 0 % (ref 0.0–0.2)

## 2021-10-12 LAB — BASIC METABOLIC PANEL
Anion gap: 8 (ref 5–15)
BUN: 16 mg/dL (ref 6–20)
CO2: 27 mmol/L (ref 22–32)
Calcium: 9.2 mg/dL (ref 8.9–10.3)
Chloride: 103 mmol/L (ref 98–111)
Creatinine, Ser: 0.98 mg/dL (ref 0.61–1.24)
GFR, Estimated: >60 mL/min (ref >60)
Glucose, Bld: 115 mg/dL — ABNORMAL HIGH (ref 70–99)
Potassium: 4.2 mmol/L (ref 3.5–5.1)
Sodium: 138 mmol/L (ref 135–145)

## 2021-10-12 MED ORDER — SODIUM CHLORIDE 0.9 % IV BOLUS
500.0000 mL | Freq: Once | INTRAVENOUS | Status: AC
  Administered 2021-10-12: 500 mL via INTRAVENOUS

## 2021-10-12 MED ORDER — AEROCHAMBER PLUS FLO-VU LARGE MISC
1.0000 | Freq: Once | Status: AC
  Administered 2021-10-12: 1

## 2021-10-12 MED ORDER — AEROCHAMBER PLUS FLO-VU LARGE MISC
Status: AC
  Filled 2021-10-12: qty 1

## 2021-10-12 MED ORDER — ALBUTEROL SULFATE HFA 108 (90 BASE) MCG/ACT IN AERS
4.0000 | INHALATION_SPRAY | Freq: Once | RESPIRATORY_TRACT | Status: AC
  Administered 2021-10-12: 4 via RESPIRATORY_TRACT
  Filled 2021-10-12: qty 6.7

## 2021-10-12 MED ORDER — PREDNISONE 10 MG PO TABS: 40.0000 mg | ORAL_TABLET | Freq: Every day | ORAL | 0 refills | Status: AC

## 2021-10-12 NOTE — ED Provider Notes (Signed)
Kevin Fitzgerald [facility] EMERGENCY DEPARTMENT Provider Note  CSN: [id number] Arrival date & time: 10/12/21 2500  Chief Complaint(s) Asthma/SOB  (COPD)  HPI Kevin Fitzgerald is a 60 y.o. male with a past medical history listed below including COPD not on supplemental oxygen, still smoker, who presents to the ED for several hours of gradual onset but rapidly worsening shortness of breath.  Consistent with prior COPD/asthma exacerbations.  Initially treated himself at home with nebs which did provide some relief but shortness of breath return.  He ran out of his home medication prompting a call to EMS.  Patient with diffuse wheezing throughout.  EMS provided him with 2 doses of duo nebs and and gave IV Solu-Medrol.  This provided the patient some relief.  He denied any associated chest pain.  No recent fevers or infections.  Patient has chronic cough but no acute or recent changes.  No congestion.  No abdominal pain.  No swelling.  HPI  Past Medical History Past Medical History:  Diagnosis Date   Arthritis    knee   Asthma    as child   COPD (chronic obstructive pulmonary disease) (HCC)    Hand fracture, right    Hypercholesteremia    Patient Active Problem List   Diagnosis Date Noted   Knee osteoarthritis 01/20/2021   Asthma    Acute exacerbation of COPD with asthma (HCC) 02/14/2020   Hyperlipemia 07/02/2018   Erectile dysfunction 05/16/2017   Tobacco use disorder 05/16/2017   MDD (major depressive disorder) 09/12/2015   Home Medication(s) Prior to Admission medications   Medication Sig Start Date End Date Taking? Authorizing Provider  predniSONE (DELTASONE) 10 MG tablet Take 4 tablets (40 mg total) by mouth daily for 4 days. 10/12/21 10/16/21 Yes Carrah Eppolito, Amadeo Garnet, MD  acetaminophen (TYLENOL) 500 MG tablet Take 500 mg by mouth every 6 (six) hours as needed for moderate pain. Patient not taking: Reported on 09/13/2021    [provider]  albuterol (VENTOLIN  HFA) 108 (90 Base) MCG/ACT inhaler Inhale 2 puffs into the lungs every 6 (six) hours as needed for wheezing or shortness of breath. Patient not taking: No sig reported 03/09/21   Westley Chandler, MD  Ascorbic Acid (VITAMIN C PO) Take 1 tablet by mouth daily.    [provider]  cholecalciferol (VITAMIN D3) 25 MCG (1000 UNIT) tablet Take 1,000 Units by mouth daily.    [provider]  diclofenac Sodium (VOLTAREN) 1 % GEL Use four times daily as needed for right knee pain 03/09/21   Westley Chandler, MD  EPINEPHrine (PRIMATENE MIST) 0.125 MG/ACT AERO Inhale into the lungs. Patient not taking: No sig reported    [provider]  Fluticasone-Umeclidin-Vilant (TRELEGY ELLIPTA) 200-62.5-25 MCG/INH AEPB Inhale 1 puff into the lungs daily. 06/14/21   Kathrin Ruddy, RPH-CPP  naproxen (NAPROSYN) 500 MG tablet Take 1 tablet (500 mg total) by mouth 2 (two) times daily with a meal. Patient not taking: No sig reported 03/09/21   Westley Chandler, MD  OVER THE COUNTER MEDICATION Take 1 capsule by mouth daily.    [provider]  tadalafil (CIALIS) 10 MG tablet Take 1 tablet (10 mg total) by mouth daily as needed for erectile dysfunction. 03/09/21   Westley Chandler, MD  vitamin B-12 (CYANOCOBALAMIN) 100 MCG tablet Take 100 mcg by mouth daily.    [provider]  VITAMIN E PO Take 1 tablet by mouth daily.    [provider]  Past Surgical History Past Surgical History:  Procedure Laterality Date   FINGER ARTHRODESIS Right 12/13/2015   Procedure: RIGHT 4TH AND 5TH CARPOMETACARPAL ARTHRODESIS;  Surgeon: Tarry Kos, MD;  Location: [facility];  Service: Orthopedics;  Laterality: Right;   TRANSURETHRAL RESECTION OF PROSTATE N/A 09/22/2013   Procedure: TRANSURETHRAL RESECTION OF THE PROSTATE WITH GYRUS INSTRUMENTS;  Surgeon:  Sebastian Ache, MD;  Location: [facility];  Service: Urology;  Laterality: N/A;   Family History Family History  Problem Relation Age of Onset   Cancer Mother    Asthma Mother    Stroke Mother    Diabetes Mother    Heart attack Father    Diabetes Brother    Asthma Brother    Birth defects Brother    Heart disease Brother     Social History Social History   Tobacco Use   Smoking status: Every Day    Packs/day: 0.10    Years: 35.00    Pack years: 3.50    Types: Cigarettes   Smokeless tobacco: Never   Tobacco comments:    Previously 1 ppd smoker - [place] - Quit many times for > 3 months.   Vaping Use   Vaping Use: Never used  Substance Use Topics   Alcohol use: Yes    Comment: occ   Drug use: No   Allergies Patient has no known allergies.  Review of Systems Review of Systems All other systems are reviewed and are negative for acute change except as noted in the HPI  Physical Exam Vital Signs  I have reviewed the triage vital signs BP 139/86 (BP Location: Right Arm)   Pulse 91   Temp (!) 96.6 F (35.9 C) (Axillary)   Resp 15   SpO2 100%   Physical Exam Vitals reviewed.  Constitutional:      General: He is not in acute distress.    Appearance: He is well-developed. He is not diaphoretic.  HENT:     Head: Normocephalic and atraumatic.     Nose: Nose normal.  Eyes:     General: No scleral icterus.       Right eye: No discharge.        Left eye: No discharge.     Conjunctiva/sclera: Conjunctivae normal.     Pupils: Pupils are equal, round, and reactive to light.  Cardiovascular:     Rate and Rhythm: Regular rhythm. Tachycardia present.     Heart sounds: No murmur heard.   No friction rub. No gallop.  Pulmonary:     Effort: Tachypnea and respiratory distress present.     Breath sounds: No stridor. Wheezing (diffuse insp and exp wheezing) present. No rales.  Abdominal:     General: There is no distension.     Palpations: Abdomen is soft.     Tenderness:  There is no abdominal tenderness.  Musculoskeletal:        General: No tenderness.     Cervical back: Normal range of motion and neck supple.  Skin:    General: Skin is warm and dry.     Findings: No erythema or rash.  Neurological:     Mental Status: He is alert and oriented to person, place, and time.    ED Results and Treatments Labs (all labs ordered are listed, but only abnormal results are displayed) Labs Reviewed  CBC WITH DIFFERENTIAL/PLATELET - Abnormal; Notable for the following components:      Result Value   WBC 13.3 (*)  Platelets 134 (*)    Lymphs Abs 5.0 (*)    Eosinophils Absolute 1.0 (*)    All other components within normal limits  BASIC METABOLIC PANEL - Abnormal; Notable for the following components:   Glucose, Bld 115 (*)    All other components within normal limits                                                                                                                         EKG  EKG Interpretation  Date/Time:  Friday October 12 2021 02:01:26 EST Ventricular Rate:  95 PR Interval:  186 QRS Duration: 96 QT Interval:  368 QTC Calculation: 462 R Axis:   63 Text Interpretation: Normal sinus rhythm Nonspecific T wave abnormality Prolonged QT Abnormal ECG No acute changes Confirmed by Drema Pry [phone]) on 10/12/2021 2:07:05 AM       Radiology DG Chest Port 1 View  Result Date: 10/12/2021 CLINICAL DATA:  Shortness of breath EXAM: PORTABLE CHEST 1 VIEW COMPARISON:  06/22/2021 FINDINGS: The heart size and mediastinal contours are within normal limits. Both lungs are clear. The visualized skeletal structures are unremarkable. IMPRESSION: No active disease. Electronically Signed   By: Alcide Clever M.D.   On: 10/12/2021 02:31    Pertinent labs & imaging results that were available during my care of the patient were reviewed by me and considered in my medical decision making (see MDM for details).  Medications Ordered in ED Medications  sodium  chloride 0.9 % bolus 500 mL (0 mLs Intravenous Stopped 10/12/21 0249)  albuterol (VENTOLIN HFA) 108 (90 Base) MCG/ACT inhaler 4 puff (4 puffs Inhalation Given 10/12/21 0409)  AeroChamber Plus Flo-Vu Large MISC 1 each (1 each Other Given 10/12/21 0409)  AeroChamber Plus Flo-Vu Large MISC (  Given 10/12/21 0415)                                                                                                                                     Procedures .1-3 Lead EKG Interpretation Performed by: Nira Conn, MD Authorized by: Nira Conn, MD     Interpretation: normal     ECG rate:  91   ECG rate assessment: normal     Rhythm: sinus rhythm     Ectopy: none     Conduction: normal    Counseled patient for approximately 5 minutes regarding smoking cessation. Discussed risks of  smoking and how they applied and affected their visit here today. Patient not ready to quit at this time, however will follow up with their primary doctor when they are.   CPT code: 62130: intermediate counseling for smoking cessation    (including critical care time)  Medical Decision Making / ED Course I have reviewed the nursing notes for this encounter and the patient's prior records (if available in EHR or on provided paperwork).  Kevin Fitzgerald was evaluated in Emergency Department on 10/12/2021 for the symptoms described in the history of present illness. He was evaluated in the context of the global COVID-19 pandemic, which necessitated consideration that the patient might be at risk for infection with the SARS-CoV-2 virus that causes COVID-19. Institutional protocols and algorithms that pertain to the evaluation of patients at risk for COVID-19 are in a state of rapid change based on information released by regulatory bodies including the CDC and federal and state organizations. These policies and algorithms were followed during the patient's care in the ED.     Shortness of  breath. Consistent with asthma/COPD exacerbation. Currently receiving second round of duo nebs from EMS.  Pertinent labs & imaging results that were available during my care of the patient were reviewed by me and considered in my medical decision making:  Chest x-ray without evidence of pneumonia, pneumothorax, pulmonary edema. CBC with leukocytosis likely demargination from steroids and shortness of breath.  No anemia. No significant electrolyte derangements or renal sufficiency on metabolic panel.  On reassessment patient's work of breathing has improved.  Monitor for additional 2 hours. Required additional albuterol puffs.    Final Clinical Impression(s) / ED Diagnoses Final diagnoses:  SOB (shortness of breath)  Acute exacerbation of COPD with asthma (HCC)   The patient appears reasonably screened and/or stabilized for discharge and I doubt any other medical condition or other [facility] requiring further screening, evaluation, or treatment in the ED at this time prior to discharge. Safe for discharge with strict return precautions.  Disposition: Discharge  Condition: Good  I have discussed the results, Dx and Tx plan with the patient/family who expressed understanding and agree(s) with the plan. Discharge instructions discussed at length. The patient/family was given strict return precautions who verbalized understanding of the instructions. No further questions at time of discharge.    ED Discharge Orders          Ordered    predniSONE (DELTASONE) 10 MG tablet  Daily        10/12/21 0446            Follow Up: Primary care provider  Schedule an appointment as soon as possible for a visit      This chart was dictated using voice recognition software.  Despite best efforts to proofread,  errors can occur which can change the documentation meaning.    Nira Conn, MD 10/12/21 [phone]

## 2021-10-12 NOTE — ED Triage Notes (Signed)
Patient arrived with EMS from home reports SOB / Asthma attack this evening , he received 2 doses of Albuterol nebulizer treatment and Solumedrol 125 mg IV by EMS with improvement .

## 2021-10-22 ENCOUNTER — Other Ambulatory Visit: Payer: Self-pay

## 2021-10-22 ENCOUNTER — Emergency Department (HOSPITAL_COMMUNITY)
Admission: EM | Admit: 2021-10-22 | Discharge: 2021-10-22 | Disposition: A | Discharge status: Home / Self Care | Payer: Self-pay | Attending: Emergency Medicine | Admitting: Emergency Medicine

## 2021-10-22 ENCOUNTER — Encounter (HOSPITAL_COMMUNITY): Payer: Self-pay

## 2021-10-22 DIAGNOSIS — M79606 Pain in leg, unspecified: Secondary | ICD-10-CM | POA: Insufficient documentation

## 2021-10-22 DIAGNOSIS — J45909 Unspecified asthma, uncomplicated: Secondary | ICD-10-CM | POA: Insufficient documentation

## 2021-10-22 DIAGNOSIS — M5432 Sciatica, left side: Secondary | ICD-10-CM

## 2021-10-22 DIAGNOSIS — J449 Chronic obstructive pulmonary disease, unspecified: Secondary | ICD-10-CM | POA: Insufficient documentation

## 2021-10-22 DIAGNOSIS — F17210 Nicotine dependence, cigarettes, uncomplicated: Secondary | ICD-10-CM | POA: Insufficient documentation

## 2021-10-22 DIAGNOSIS — S3992XA Unspecified injury of lower back, initial encounter: Secondary | ICD-10-CM

## 2021-10-22 DIAGNOSIS — M549 Dorsalgia, unspecified: Secondary | ICD-10-CM | POA: Insufficient documentation

## 2021-10-22 MED ORDER — METHOCARBAMOL 500 MG PO TABS: 500.0000 mg | ORAL_TABLET | Freq: Two times a day (BID) | ORAL | 0 refills | Status: DC

## 2021-10-22 MED ORDER — METHYLPREDNISOLONE 4 MG PO TBPK: ORAL_TABLET | ORAL | 0 refills | Status: DC

## 2021-10-22 MED ORDER — CELECOXIB 200 MG PO CAPS: 200.0000 mg | ORAL_CAPSULE | Freq: Two times a day (BID) | ORAL | 0 refills | Status: DC

## 2021-10-22 NOTE — ED Provider Notes (Signed)
[facility] EMERGENCY DEPARTMENT Provider Note   CSN: [id number] Arrival date & time: 10/22/21  1622     History Chief Complaint  Patient presents with   Back Pain    Kevin Fitzgerald is a 60 y.o. male past medical history of back injury at work.  He was seen in the past and had x-ray that showed a bone spur.  Patient states that every time he coughs he has severe pain shooting down the back of his leg.  He also has the pain when he lifts his leg.  This is new.  He has been taking Tylenol without relief.  He denies weakness or numbness, saddle anesthesia.   Back Pain     Past Medical History:  Diagnosis Date   Arthritis    knee   Asthma    as child   COPD (chronic obstructive pulmonary disease) (HCC)    Hand fracture, right    Hypercholesteremia     Patient Active Problem List   Diagnosis Date Noted   Knee osteoarthritis 01/20/2021   Asthma    Acute exacerbation of COPD with asthma (HCC) 02/14/2020   Hyperlipemia 07/02/2018   Erectile dysfunction 05/16/2017   Tobacco use disorder 05/16/2017   MDD (major depressive disorder) 09/12/2015    Past Surgical History:  Procedure Laterality Date   FINGER ARTHRODESIS Right 12/13/2015   Procedure: RIGHT 4TH AND 5TH CARPOMETACARPAL ARTHRODESIS;  Surgeon: Tarry Kos, MD;  Location: [facility];  Service: Orthopedics;  Laterality: Right;   TRANSURETHRAL RESECTION OF PROSTATE N/A 09/22/2013   Procedure: TRANSURETHRAL RESECTION OF THE PROSTATE WITH GYRUS INSTRUMENTS;  Surgeon: Sebastian Ache, MD;  Location: [facility];  Service: Urology;  Laterality: N/A;       Family History  Problem Relation Age of Onset   Cancer Mother    Asthma Mother    Stroke Mother    Diabetes Mother    Heart attack Father    Diabetes Brother    Asthma Brother    Birth defects Brother    Heart disease Brother     Social History   Tobacco Use   Smoking status: Every Day    Packs/day: 0.10    Years: 35.00     Pack years: 3.50    Types: Cigarettes   Smokeless tobacco: Never   Tobacco comments:    Previously 1 ppd smoker - [place] - Quit many times for > 3 months.   Vaping Use   Vaping Use: Never used  Substance Use Topics   Alcohol use: Yes    Comment: occ   Drug use: No    Home Medications Prior to Admission medications   Medication Sig Start Date End Date Taking? Authorizing Provider  acetaminophen (TYLENOL) 500 MG tablet Take 500 mg by mouth every 6 (six) hours as needed for moderate pain. Patient not taking: Reported on 09/13/2021    [provider]  albuterol (VENTOLIN HFA) 108 (90 Base) MCG/ACT inhaler Inhale 2 puffs into the lungs every 6 (six) hours as needed for wheezing or shortness of breath. Patient not taking: No sig reported 03/09/21   Westley Chandler, MD  Ascorbic Acid (VITAMIN C PO) Take 1 tablet by mouth daily.    [provider]  cholecalciferol (VITAMIN D3) 25 MCG (1000 UNIT) tablet Take 1,000 Units by mouth daily.    [provider]  diclofenac Sodium (VOLTAREN) 1 % GEL Use four times daily as needed for right knee pain 03/09/21  Westley Chandler, MD  EPINEPHrine (PRIMATENE MIST) 0.125 MG/ACT AERO Inhale into the lungs. Patient not taking: No sig reported    [provider]  Fluticasone-Umeclidin-Vilant (TRELEGY ELLIPTA) 200-62.5-25 MCG/INH AEPB Inhale 1 puff into the lungs daily. 06/14/21   Kathrin Ruddy, RPH-CPP  naproxen (NAPROSYN) 500 MG tablet Take 1 tablet (500 mg total) by mouth 2 (two) times daily with a meal. Patient not taking: No sig reported 03/09/21   Westley Chandler, MD  OVER THE COUNTER MEDICATION Take 1 capsule by mouth daily.    [provider]  tadalafil (CIALIS) 10 MG tablet Take 1 tablet (10 mg total) by mouth daily as needed for erectile dysfunction. 03/09/21   Westley Chandler, MD  vitamin B-12 (CYANOCOBALAMIN) 100 MCG tablet Take 100 mcg by mouth daily.    [provider]  VITAMIN E PO Take 1 tablet by  mouth daily.    [provider]    Allergies    Patient has no known allergies.  Review of Systems   Review of Systems  Musculoskeletal:  Positive for back pain.  Ten systems reviewed and are negative for acute change, except as noted in the HPI.   Physical Exam Updated Vital Signs BP 135/89 (BP Location: Right Arm)    Pulse 72    Temp 98 F (36.7 C) (Oral)    Resp 18    Ht 5\' 6"  (1.676 m)    Wt 74.8 kg    SpO2 99%    BMI 26.63 kg/m   Physical Exam Vitals and nursing note reviewed.  Constitutional:      General: He is not in acute distress.    Appearance: He is well-developed. He is not diaphoretic.  HENT:     Head: Normocephalic and atraumatic.  Eyes:     General: No scleral icterus.    Conjunctiva/sclera: Conjunctivae normal.  Cardiovascular:     Rate and Rhythm: Normal rate and regular rhythm.     Heart sounds: Normal heart sounds.  Pulmonary:     Effort: Pulmonary effort is normal. No respiratory distress.     Breath sounds: Normal breath sounds.  Abdominal:     Palpations: Abdomen is soft.     Tenderness: There is no abdominal tenderness.  Musculoskeletal:     Cervical back: Normal range of motion and neck supple.     Comments: Lumbosacral spine area reveals mild-moderate tenderness and moderate spasm.  Painful and reduced LS ROM noted. Straight leg raise is positive at 30 degrees on left. DTR's, motor strength and sensation normal, including heel and toe gait.  Peripheral pulses are palpable. Hips and knees have full range of motion without pain. No abdominal tenderness, mass or organomegaly.   Skin:    General: Skin is warm and dry.  Neurological:     Mental Status: He is alert.  Psychiatric:        Behavior: Behavior normal.    ED Results / Procedures / Treatments   Labs (all labs ordered are listed, but only abnormal results are displayed) Labs Reviewed - No data to display  EKG None  Radiology No results found.  Procedures Procedures    Medications Ordered in ED Medications - No data to display  ED Course  I have reviewed the triage vital signs and the nursing notes.  Pertinent labs & imaging results that were available during my care of the patient were reviewed by me and considered in my medical decision making (see chart for  details).    MDM Rules/Calculators/A&P                         Patient with back pain. + for sciatica. Referral to neurosurgery.  No neurological deficits and normal neuro exam.  Patient can walk but states is painful.  No loss of bowel or bladder control.  No concern for cauda equina.  No fever, night sweats, weight loss, h/o cancer, IVDU.  RICE protocol and pain medicine indicated and discussed with patient.   Final Clinical Impression(s) / ED Diagnoses Final diagnoses:  None    Rx / DC Orders ED Discharge Orders     None        Arthor Captain, PA-C 10/22/21 1740    Pollyann Savoy, MD 10/22/21 2236

## 2021-10-22 NOTE — Discharge Instructions (Addendum)
SEEK IMMEDIATE MEDICAL ATTENTION IF: New numbness, tingling, weakness, or problem with the use of your arms or legs.  Severe back pain not relieved with medications.  Change in bowel or bladder control.  Increasing pain in any areas of the body (such as chest or abdominal pain).  Shortness of breath, dizziness or fainting.  Nausea (feeling sick to your stomach), vomiting, fever, or sweats.  

## 2021-10-22 NOTE — ED Triage Notes (Addendum)
Pt arrived POV from home c/o of back x1 week. Pt states he has a sharp pain from his lower back that runs down his left leg. Pt states he is hardly able to stand or walk d/t pain. PT denies any injury or trauma.

## 2022-04-09 ENCOUNTER — Encounter: Payer: Self-pay | Admitting: *Deleted

## 2022-05-15 ENCOUNTER — Encounter (HOSPITAL_COMMUNITY): Payer: Self-pay

## 2022-05-15 ENCOUNTER — Other Ambulatory Visit: Payer: Self-pay

## 2022-05-15 ENCOUNTER — Emergency Department (HOSPITAL_COMMUNITY)
Admission: EM | Admit: 2022-05-15 | Discharge: 2022-05-15 | Discharge status: Against Medical Advice | Payer: Self-pay | Attending: Emergency Medicine | Admitting: Emergency Medicine

## 2022-05-15 ENCOUNTER — Emergency Department (HOSPITAL_COMMUNITY): Payer: Self-pay

## 2022-05-15 DIAGNOSIS — R0602 Shortness of breath: Secondary | ICD-10-CM | POA: Insufficient documentation

## 2022-05-15 DIAGNOSIS — J45909 Unspecified asthma, uncomplicated: Secondary | ICD-10-CM | POA: Insufficient documentation

## 2022-05-15 DIAGNOSIS — F17210 Nicotine dependence, cigarettes, uncomplicated: Secondary | ICD-10-CM | POA: Insufficient documentation

## 2022-05-15 DIAGNOSIS — Z7951 Long term (current) use of inhaled steroids: Secondary | ICD-10-CM | POA: Insufficient documentation

## 2022-05-15 DIAGNOSIS — Z5321 Procedure and treatment not carried out due to patient leaving prior to being seen by health care provider: Secondary | ICD-10-CM | POA: Insufficient documentation

## 2022-05-15 DIAGNOSIS — J449 Chronic obstructive pulmonary disease, unspecified: Secondary | ICD-10-CM | POA: Insufficient documentation

## 2022-05-15 NOTE — ED Provider Triage Note (Signed)
Emergency Medicine Provider Triage Evaluation Note  Kevin Fitzgerald , a 61 y.o. male  was evaluated in triage.  Pt complains of shortness of breath and wheezing.  Patient reports that he has been dealing with this every day over the last few weeks.  States that today while at [facility] his symptoms became too much and 911 was called.  Patient reports resolution of his symptoms after receiving Solu-Medrol and nebulizer treatment with EMS.  Patient reports that he has a history of COPD and stopped taking his medication approximate 1 year prior.  Patient reports that he still smokes cigarettes however has decreased the amount he is smoking.  Review of Systems  Positive: Shortness of breath, wheezing Negative: Chest pain, fever, chills, productive cough  Physical Exam  BP 136/70 (BP Location: Right Arm)   Pulse 77   Temp 98.1 F (36.7 C) (Oral)   Resp 18   SpO2 100%  Gen:   Awake, no distress   Resp:  Normal effort, clear to auscultation bilaterally MSK:   Moves extremities without difficulty  Other:  +2 radial pulse bilaterally  Medical Decision Making  Medically screening exam initiated at 7:04 PM.  Appropriate orders placed.  Kevin Fitzgerald was informed that the remainder of the evaluation will be completed by another provider, this initial triage assessment does not replace that evaluation, and the importance of remaining in the ED until their evaluation is complete.     Haskel Schroeder, [place] 05/15/22 1906

## 2022-05-15 NOTE — ED Notes (Signed)
Patient states he is leaving d/t wait time 

## 2022-05-15 NOTE — ED Triage Notes (Signed)
At [employer], started having sob hxx of asthma but has been out of inhaler.  EMS gave albuterol and duoneb and solumedrol.

## 2022-05-16 ENCOUNTER — Encounter (HOSPITAL_COMMUNITY): Payer: Self-pay | Admitting: Emergency Medicine

## 2022-05-16 ENCOUNTER — Ambulatory Visit (HOSPITAL_COMMUNITY)
Admission: EM | Admit: 2022-05-16 | Discharge: 2022-05-16 | Disposition: A | Discharge status: Home / Self Care | Payer: Medicaid Other | Attending: Urgent Care | Admitting: Urgent Care

## 2022-05-16 DIAGNOSIS — J45901 Unspecified asthma with (acute) exacerbation: Secondary | ICD-10-CM

## 2022-05-16 DIAGNOSIS — J441 Chronic obstructive pulmonary disease with (acute) exacerbation: Secondary | ICD-10-CM

## 2022-05-16 MED ORDER — FLUTICASONE-SALMETEROL 113-14 MCG/ACT IN AEPB: 1.0000 | INHALATION_SPRAY | Freq: Two times a day (BID) | RESPIRATORY_TRACT | 2 refills | Status: DC

## 2022-05-16 MED ORDER — DEXAMETHASONE SODIUM PHOSPHATE 10 MG/ML IJ SOLN
10.0000 mg | Freq: Once | INTRAMUSCULAR | Status: AC
  Administered 2022-05-16: 10 mg via INTRAMUSCULAR

## 2022-05-16 MED ORDER — IPRATROPIUM-ALBUTEROL 0.5-2.5 (3) MG/3ML IN SOLN
RESPIRATORY_TRACT | Status: AC
  Filled 2022-05-16: qty 3

## 2022-05-16 MED ORDER — ALBUTEROL SULFATE HFA 108 (90 BASE) MCG/ACT IN AERS: 1.0000 | INHALATION_SPRAY | Freq: Four times a day (QID) | RESPIRATORY_TRACT | 2 refills | Status: DC | PRN

## 2022-05-16 MED ORDER — IPRATROPIUM-ALBUTEROL 0.5-2.5 (3) MG/3ML IN SOLN
3.0000 mL | Freq: Once | RESPIRATORY_TRACT | Status: AC
  Administered 2022-05-16: 3 mL via RESPIRATORY_TRACT

## 2022-05-16 MED ORDER — PREDNISONE 50 MG PO TABS: 50.0000 mg | ORAL_TABLET | Freq: Every day | ORAL | 0 refills | Status: AC

## 2022-05-16 MED ORDER — DEXAMETHASONE SODIUM PHOSPHATE 10 MG/ML IJ SOLN
INTRAMUSCULAR | Status: AC
  Filled 2022-05-16: qty 1

## 2022-05-16 NOTE — ED Triage Notes (Signed)
Pt c/o reports that the car was really hot causing him to become SOB. Hx COPD. Doesn't have inhaler with patient to take.

## 2022-05-16 NOTE — ED Provider Notes (Signed)
[facility]    CSN: [id number] Arrival date & time: 05/16/22  1701      History   Chief Complaint Chief Complaint  Patient presents with   Shortness of Breath    HPI BRAM Kevin Fitzgerald is a 61 y.o. male.   Pleasant 61 year old male with a known history of asthma/COPD presents today with an acute onset of shortness of breath.  He states his pulmonologist has prescribed him Trelegy, but he does not have insurance and cannot afford the medication.  He has not had his daily controlling medication for "quite some time", and also recently ran out of his rescue inhaler.  He states that heat is one of his main triggers, and due to denial of his disability, started working again outdoors.  He started feeling short of breath yesterday, worsening this afternoon.  Did not have any medication to take.  Denies a history of CHF or other known cardiovascular disease.  He denies swelling of his throat or dysphagia.  He denies fever.  He has had numerous flares in the past, and states this feels equivocal.  He does have a history of cigarette smoking, but states he quit a while back. Has had extensive workups in the ER in August and Dec of 2022 (labs, CXR, EKG). Pt went to ER yesterday, had EKG and CXR performed, both negative, and left prior to eval by MD.   Shortness of Breath   Past Medical History:  Diagnosis Date   Arthritis    knee   Asthma    as child   COPD (chronic obstructive pulmonary disease) (HCC)    Hand fracture, right    Hypercholesteremia     Patient Active Problem List   Diagnosis Date Noted   Knee osteoarthritis 01/20/2021   Asthma    Acute exacerbation of COPD with asthma (HCC) 02/14/2020   Hyperlipemia 07/02/2018   Erectile dysfunction 05/16/2017   Tobacco use disorder 05/16/2017   MDD (major depressive disorder) 09/12/2015    Past Surgical History:  Procedure Laterality Date   FINGER ARTHRODESIS Right 12/13/2015   Procedure: RIGHT 4TH AND 5TH  CARPOMETACARPAL ARTHRODESIS;  Surgeon: Tarry Kos, MD;  Location: [facility];  Service: Orthopedics;  Laterality: Right;   TRANSURETHRAL RESECTION OF PROSTATE N/A 09/22/2013   Procedure: TRANSURETHRAL RESECTION OF THE PROSTATE WITH GYRUS INSTRUMENTS;  Surgeon: Sebastian Ache, MD;  Location: [facility];  Service: Urology;  Laterality: N/A;       Home Medications    Prior to Admission medications   Medication Sig Start Date End Date Taking? Authorizing Provider  albuterol (VENTOLIN HFA) 108 (90 Base) MCG/ACT inhaler Inhale 1-2 puffs into the lungs every 6 (six) hours as needed for wheezing or shortness of breath. 05/16/22  Yes Antwuan Eckley L, PA  Fluticasone-Salmeterol (AIRDUO RESPICLICK 113/14) 113-14 MCG/ACT AEPB Inhale 1 Inhalation into the lungs in the morning and at bedtime. 05/16/22  Yes Arlet Marter L, PA  predniSONE (DELTASONE) 50 MG tablet Take 1 tablet (50 mg total) by mouth daily with breakfast for 5 days. 05/16/22 05/21/22 Yes Preslynn Bier L, PA  acetaminophen (TYLENOL) 500 MG tablet Take 500 mg by mouth every 6 (six) hours as needed for moderate pain. Patient not taking: Reported on 09/13/2021    [provider]  Ascorbic Acid (VITAMIN C PO) Take 1 tablet by mouth daily.    [provider]  celecoxib (CELEBREX) 200 MG capsule Take 1 capsule (200 mg total) by mouth 2 (two) times  daily. 10/22/21   Arthor Captain, PA-C  cholecalciferol (VITAMIN D3) 25 MCG (1000 UNIT) tablet Take 1,000 Units by mouth daily.    [provider]  diclofenac Sodium (VOLTAREN) 1 % GEL Use four times daily as needed for right knee pain 03/09/21   Westley Chandler, MD  OVER THE COUNTER MEDICATION Take 1 capsule by mouth daily.    [provider]  tadalafil (CIALIS) 10 MG tablet Take 1 tablet (10 mg total) by mouth daily as needed for erectile dysfunction. 03/09/21   Westley Chandler, MD  vitamin B-12 (CYANOCOBALAMIN) 100 MCG tablet Take 100 mcg by mouth daily.     [provider]  VITAMIN E PO Take 1 tablet by mouth daily.    [provider]    Family History Family History  Problem Relation Age of Onset   Cancer Mother    Asthma Mother    Stroke Mother    Diabetes Mother    Heart attack Father    Diabetes Brother    Asthma Brother    Birth defects Brother    Heart disease Brother     Social History Social History   Tobacco Use   Smoking status: Every Day    Packs/day: 0.10    Years: 35.00    Total pack years: 3.50    Types: Cigarettes   Smokeless tobacco: Never   Tobacco comments:    Previously 1 ppd smoker - [place] - Quit many times for > 3 months.   Vaping Use   Vaping Use: Never used  Substance Use Topics   Alcohol use: Yes    Comment: occ   Drug use: No     Allergies   Patient has no known allergies.   Review of Systems Review of Systems  Respiratory:  Positive for shortness of breath.   As per HPI   Physical Exam Triage Vital Signs ED Triage Vitals  Enc Vitals Group     BP 05/16/22 1716 (!) 147/93     Pulse Rate 05/16/22 1716 77     Resp 05/16/22 1716 (!) 24     Temp 05/16/22 1716 98.1 F (36.7 C)     Temp Source 05/16/22 1716 Oral     SpO2 05/16/22 1716 99 %     Weight --      Height --      Head Circumference --      Peak Flow --      Pain Score 05/16/22 1717 5     Pain Loc --      Pain Edu? --      Excl. in GC? --    No data found.  Updated Vital Signs BP (!) 147/93 (BP Location: Right Arm)   Pulse 77   Temp 98.1 F (36.7 C) (Oral)   Resp (!) 24   SpO2 99%   Visual Acuity Right Eye Distance:   Left Eye Distance:   Bilateral Distance:    Right Eye Near:   Left Eye Near:    Bilateral Near:     Physical Exam Vitals and nursing note reviewed.  Constitutional:      General: He is in acute distress (tachypnea upon presentation, resolved after dexamethasone and neb).     Appearance: He is well-developed. He is not ill-appearing, toxic-appearing or diaphoretic.   HENT:     Head: Normocephalic and atraumatic.     Mouth/Throat:     Mouth: Mucous membranes are moist.     Pharynx:  Oropharynx is clear. No pharyngeal swelling or oropharyngeal exudate.  Eyes:     Extraocular Movements: Extraocular movements intact.     Pupils: Pupils are equal, round, and reactive to light.  Neck:     Thyroid: No thyromegaly.  Cardiovascular:     Rate and Rhythm: Normal rate and regular rhythm. No extrasystoles are present.    Heart sounds: No murmur heard. Pulmonary:     Effort: Tachypnea (at presentation; resolved s/p neb) present.     Breath sounds: Examination of the right-upper field reveals decreased breath sounds. Examination of the left-upper field reveals decreased breath sounds. Examination of the right-middle field reveals decreased breath sounds and wheezing. Examination of the left-middle field reveals decreased breath sounds and wheezing. Examination of the right-lower field reveals decreased breath sounds. Examination of the left-lower field reveals decreased breath sounds. Decreased breath sounds and wheezing present. No rhonchi or rales.     Comments: Breath sounds and wheezing nearly resolved s/p duoneb treatment Chest:     Chest wall: No mass, deformity, tenderness, crepitus or edema. There is no dullness to percussion.  Musculoskeletal:     Cervical back: Normal range of motion and neck supple.     Right lower leg: No edema.     Left lower leg: No edema.  Lymphadenopathy:     Cervical: No cervical adenopathy.  Skin:    General: Skin is warm and dry.     Capillary Refill: Capillary refill takes less than 2 seconds.     Coloration: Skin is not cyanotic.     Findings: No ecchymosis or erythema.  Neurological:     General: No focal deficit present.     Mental Status: He is alert and oriented to person, place, and time.  Psychiatric:        Mood and Affect: Mood normal.        Behavior: Behavior normal.      UC Treatments / Results   Labs (all labs ordered are listed, but only abnormal results are displayed) Labs Reviewed - No data to display  EKG   Radiology DG Chest 2 View  Result Date: 05/15/2022 CLINICAL DATA:  Shortness of breath EXAM: CHEST - 2 VIEW COMPARISON:  10/12/2021 FINDINGS: The heart size and mediastinal contours are within normal limits. Both lungs are clear. The visualized skeletal structures are unremarkable. IMPRESSION: No active cardiopulmonary disease. Electronically Signed   By: Jasmine Pang M.D.   On: 05/15/2022 19:55    Procedures Procedures (including critical care time)  Medications Ordered in UC Medications  ipratropium-albuterol (DUONEB) 0.5-2.5 (3) MG/3ML nebulizer solution 3 mL (3 mLs Nebulization Given 05/16/22 1737)  dexamethasone (DECADRON) injection 10 mg (10 mg Intramuscular Given 05/16/22 1741)    Initial Impression / Assessment and Plan / UC Course  I have reviewed the triage vital signs and the nursing notes.  Pertinent labs & imaging results that were available during my care of the patient were reviewed by me and considered in my medical decision making (see chart for details).     COPD exacerbation -status post dexamethasone and nebulizer treatment in office, patient felt back to baseline.  We will discharge patient with 50 mg Pred x5 days, rescue inhaler albuterol, and air duo in place of his Trelegy Ellipta as he cannot afford this.  Recommended he contact his PCP for further follow-up and evaluation.  Strict return to clinic precautions and ER precautions reviewed.   Final Clinical Impressions(s) / UC Diagnoses   Final diagnoses:  Acute exacerbation of COPD with asthma [facility])     Discharge Instructions      Please start using the air duo in place of the Trelegy as this is more cost effective. Use this 1 puff twice daily. I have also refilled your albuterol inhaler.  You may take 2 puffs every 4-6 hours as needed for shortness of breath. Please start taking  the prednisone as prescribed in the mornings. You were given an injection of Decadron and a DuoNeb nebulizer solution here in our office. If your symptoms worsen, or do not resolve, please had to the emergency room.     ED Prescriptions     Medication Sig Dispense Auth. Provider   Fluticasone-Salmeterol (AIRDUO RESPICLICK 113/14) 113-14 MCG/ACT AEPB Inhale 1 Inhalation into the lungs in the morning and at bedtime. 1 each Paden Senger L, PA   albuterol (VENTOLIN HFA) 108 (90 Base) MCG/ACT inhaler Inhale 1-2 puffs into the lungs every 6 (six) hours as needed for wheezing or shortness of breath. 8 g Giovonnie Trettel L, PA   predniSONE (DELTASONE) 50 MG tablet Take 1 tablet (50 mg total) by mouth daily with breakfast for 5 days. 5 tablet Chandon Lazcano L, [place]      PDMP not reviewed this encounter.   Maretta Bees, [place] 05/16/22 1817

## 2022-05-16 NOTE — Discharge Instructions (Addendum)
Please start using the air duo in place of the Trelegy as this is more cost effective. Use this 1 puff twice daily. I have also refilled your albuterol inhaler.  You may take 2 puffs every 4-6 hours as needed for shortness of breath. Please start taking the prednisone as prescribed in the mornings. You were given an injection of Decadron and a DuoNeb nebulizer solution here in our office. If your symptoms worsen, or do not resolve, please had to the emergency room.

## 2022-08-04 ENCOUNTER — Ambulatory Visit (HOSPITAL_COMMUNITY)
Admission: EM | Admit: 2022-08-04 | Discharge: 2022-08-04 | Disposition: A | Discharge status: Home / Self Care | Payer: BC Managed Care – PPO | Attending: Physician Assistant | Admitting: Physician Assistant

## 2022-08-04 ENCOUNTER — Encounter (HOSPITAL_COMMUNITY): Payer: Self-pay

## 2022-08-04 DIAGNOSIS — G8929 Other chronic pain: Secondary | ICD-10-CM | POA: Diagnosis not present

## 2022-08-04 DIAGNOSIS — J4541 Moderate persistent asthma with (acute) exacerbation: Secondary | ICD-10-CM | POA: Diagnosis not present

## 2022-08-04 DIAGNOSIS — M545 Low back pain, unspecified: Secondary | ICD-10-CM

## 2022-08-04 MED ORDER — METHYLPREDNISOLONE SODIUM SUCC 125 MG IJ SOLR
INTRAMUSCULAR | Status: AC
  Filled 2022-08-04: qty 2

## 2022-08-04 MED ORDER — PREDNISONE 10 MG PO TABS: 10.0000 mg | ORAL_TABLET | Freq: Three times a day (TID) | ORAL | 0 refills | Status: DC

## 2022-08-04 MED ORDER — FLUTICASONE-SALMETEROL 113-14 MCG/ACT IN AEPB: 1.0000 | INHALATION_SPRAY | Freq: Two times a day (BID) | RESPIRATORY_TRACT | 2 refills | Status: DC

## 2022-08-04 MED ORDER — ALBUTEROL SULFATE HFA 108 (90 BASE) MCG/ACT IN AERS: 1.0000 | INHALATION_SPRAY | Freq: Four times a day (QID) | RESPIRATORY_TRACT | 2 refills | Status: DC | PRN

## 2022-08-04 MED ORDER — CELECOXIB 200 MG PO CAPS: 200.0000 mg | ORAL_CAPSULE | Freq: Two times a day (BID) | ORAL | 0 refills | Status: DC

## 2022-08-04 MED ORDER — METHYLPREDNISOLONE SODIUM SUCC 125 MG IJ SOLR
80.0000 mg | Freq: Once | INTRAMUSCULAR | Status: AC
  Administered 2022-08-04: 80 mg via INTRAMUSCULAR

## 2022-08-04 NOTE — ED Provider Notes (Signed)
[facility]    CSN: [id number] Arrival date & time: 08/04/22  1002      History   Chief Complaint Chief Complaint  Patient presents with   Back Pain   Asthma    HPI Kevin Fitzgerald is a 61 y.o. male.   61 year old male presents with exacerbation of his asthma and lower back pain.  Patient indicates that he continues to smoke and over the past several days he has had an increase in his asthma symptoms.  He relates having for the past several days some increased wheezing, shortness of breath, and difficulty breathing.  He relates that he is not getting relief with his albuterol inhaler, or he has Symbicort inhaler, and he is using Primatene Mist to try to decrease his wheezing episodes.  Patient indicates that his shortness of breath is worse while he is doing activity, and he does relate that he will decrease smoking due to continue to aggravate his asthma.  Patient denies any fever, chills, nausea or vomiting. Patient also relates that he has chronic lower back pain, and over the past week he has been having continued localized lower back pain that is worse with movement, bending, lifting, turning and reaching.  Patient relates he does not have any problems with numbness, tingling, or weakness of the lower extremities.  He relates he has been taken some over-the-counter medicine but this is not helped relieve his lower back pain.  He denies any urinary symptoms.   Back Pain Asthma Associated symptoms include shortness of breath.    Past Medical History:  Diagnosis Date   Arthritis    knee   Asthma    as child   COPD (chronic obstructive pulmonary disease) (HCC)    Hand fracture, right    Hypercholesteremia     Patient Active Problem List   Diagnosis Date Noted   Knee osteoarthritis 01/20/2021   Asthma    Acute exacerbation of COPD with asthma (HCC) 02/14/2020   Hyperlipemia 07/02/2018   Erectile dysfunction 05/16/2017   Tobacco use disorder 05/16/2017   MDD  (major depressive disorder) 09/12/2015    Past Surgical History:  Procedure Laterality Date   FINGER ARTHRODESIS Right 12/13/2015   Procedure: RIGHT 4TH AND 5TH CARPOMETACARPAL ARTHRODESIS;  Surgeon: Tarry Kos, MD;  Location: [facility];  Service: Orthopedics;  Laterality: Right;   TRANSURETHRAL RESECTION OF PROSTATE N/A 09/22/2013   Procedure: TRANSURETHRAL RESECTION OF THE PROSTATE WITH GYRUS INSTRUMENTS;  Surgeon: Sebastian Ache, MD;  Location: [facility];  Service: Urology;  Laterality: N/A;       Home Medications    Prior to Admission medications   Medication Sig Start Date End Date Taking? Authorizing Provider  predniSONE (DELTASONE) 10 MG tablet Take 1 tablet (10 mg total) by mouth in the morning, at noon, and at bedtime. 08/04/22  Yes Ellsworth Lennox, PA-C  acetaminophen (TYLENOL) 500 MG tablet Take 500 mg by mouth every 6 (six) hours as needed for moderate pain. Patient not taking: Reported on 09/13/2021    [provider]  albuterol (VENTOLIN HFA) 108 (90 Base) MCG/ACT inhaler Inhale 1-2 puffs into the lungs every 6 (six) hours as needed for wheezing or shortness of breath. 08/04/22   Ellsworth Lennox, PA-C  Ascorbic Acid (VITAMIN C PO) Take 1 tablet by mouth daily.    [provider]  celecoxib (CELEBREX) 200 MG capsule Take 1 capsule (200 mg total) by mouth 2 (two) times daily. 08/04/22   Ellsworth Lennox, PA-C  cholecalciferol (VITAMIN D3) 25 MCG (1000 UNIT) tablet Take 1,000 Units by mouth daily.    [provider]  diclofenac Sodium (VOLTAREN) 1 % GEL Use four times daily as needed for right knee pain 03/09/21   Martyn Malay, MD  Fluticasone-Salmeterol [facility] RESPICLICK 035/00) 938-18 MCG/ACT AEPB Inhale 1 Inhalation into the lungs in the morning and at bedtime. 08/04/22   Nyoka Lint, PA-C  OVER THE COUNTER MEDICATION Take 1 capsule by mouth daily.    [provider]  tadalafil (CIALIS) 10 MG tablet Take 1 tablet (10 mg total) by mouth  daily as needed for erectile dysfunction. 03/09/21   Martyn Malay, MD  vitamin B-12 (CYANOCOBALAMIN) 100 MCG tablet Take 100 mcg by mouth daily.    [provider]  VITAMIN E PO Take 1 tablet by mouth daily.    [provider]    Family History Family History  Problem Relation Age of Onset   Cancer Mother    Asthma Mother    Stroke Mother    Diabetes Mother    Heart attack Father    Diabetes Brother    Asthma Brother    Birth defects Brother    Heart disease Brother     Social History Social History   Tobacco Use   Smoking status: Every Day    Packs/day: 0.10    Years: 35.00    Total pack years: 3.50    Types: Cigarettes   Smokeless tobacco: Never   Tobacco comments:    Previously 1 ppd smoker - [place] - Quit many times for > 3 months.   Vaping Use   Vaping Use: Never used  Substance Use Topics   Alcohol use: Yes    Comment: occ   Drug use: No     Allergies   Patient has no known allergies.   Review of Systems Review of Systems  Respiratory:  Positive for shortness of breath and wheezing.   Musculoskeletal:  Positive for back pain (lower back).     Physical Exam Triage Vital Signs ED Triage Vitals [08/04/22 1017]  Enc Vitals Group     BP (!) 155/90     Pulse Rate 64     Resp 18     Temp 97.6 F (36.4 C)     Temp Source Oral     SpO2 98 %     Weight      Height      Head Circumference      Peak Flow      Pain Score      Pain Loc      Pain Edu?      Excl. in [place]?    No data found.  Updated Vital Signs BP (!) 155/90 (BP Location: Left Arm)   Pulse 64   Temp 97.6 F (36.4 C) (Oral)   Resp 18   SpO2 98%   Visual Acuity Right Eye Distance:   Left Eye Distance:   Bilateral Distance:    Right Eye Near:   Left Eye Near:    Bilateral Near:     Physical Exam Constitutional:      Appearance: Normal appearance.  Cardiovascular:     Rate and Rhythm: Normal rate and regular rhythm.     Heart sounds: Normal heart  sounds.  Pulmonary:     Effort: Pulmonary effort is normal.     Breath sounds: Normal breath sounds and air entry. No wheezing, rhonchi or rales.  Musculoskeletal:  Back:     Comments: Back: Pain is palpated along the L3-L5 area midline, no unusual redness or swelling of the area.  Range of motion is limited with pain on bending and turning. Negative straight leg raise bilaterally, strength is intact bilaterally.  DTRs are 2+ and symmetrical bilaterally  Lymphadenopathy:     Cervical: No cervical adenopathy.  Neurological:     Mental Status: He is alert.      UC Treatments / Results  Labs (all labs ordered are listed, but only abnormal results are displayed) Labs Reviewed - No data to display  EKG   Radiology No results found.  Procedures Procedures (including critical care time)  Medications Ordered in UC Medications  methylPREDNISolone sodium succinate (SOLU-MEDROL) 125 mg/2 mL injection 80 mg (has no administration in time range)    Initial Impression / Assessment and Plan / UC Course  I have reviewed the triage vital signs and the nursing notes.  Pertinent labs & imaging results that were available during my care of the patient were reviewed by me and considered in my medical decision making (see chart for details).    The acute asthma exacerbation will be treated with the following: A.  Solu-Medrol 80 mg given IM to help reduce the acute inflammatory reaction. B.  Prednisone 10 mg 3 times a day for 5 days only to help treat the acute inflammatory reaction. C.  Advised to continue the albuterol inhaler, 2 puffs every 6 hours as needed for acute wheezing and shortness of breath. D.  Advised to continue the air duo every 12 hours to help reduce the wheezing and shortness of breath. E.  Advised to decrease smoking. The exacerbation of chronic back pain will be treated with the following: A.  The Solu-Medrol 80 mg that is given for the asthma will also help  decrease the acute exacerbation of the chronic lower back pain. B.  The prednisone 10 mg 3 times a day that would be given to treat the asthma will also help decrease the acute exacerbation of lower back pain C.  Patient is advised to take the Celebrex 200 mg twice daily to help treat the acute lower back pain. D.  Patient is advised to follow-up with PCP or return urgent care if symptoms fail to improve Final Clinical Impressions(s) / UC Diagnoses   Final diagnoses:  Chronic midline low back pain without sciatica  Moderate persistent asthma with exacerbation     Discharge Instructions      Advised to continue using albuterol inhaler, 2 puffs every 6 hours on a regular basis to help decrease wheezing and shortness of breath. Advised to continue using the air duo every 12 hours to help reduce the wheezing and shortness of breath. Advised take prednisone 10 mg 3 times a day until completed as this will help reduce the asthma exacerbation and also the lower back pain. Advised to take the Celebrex 200 mg twice daily as this will help decrease lower back pain.   Next advised follow-up PCP or return to urgent care if symptoms fail to improve.     ED Prescriptions     Medication Sig Dispense Auth. Provider   albuterol (VENTOLIN HFA) 108 (90 Base) MCG/ACT inhaler Inhale 1-2 puffs into the lungs every 6 (six) hours as needed for wheezing or shortness of breath. 8 g Ellsworth Lennox, PA-C   Fluticasone-Salmeterol [facility] RESPICLICK 113/14) 113-14 MCG/ACT AEPB Inhale 1 Inhalation into the lungs in the morning and at bedtime. 1 each  Ellsworth Lennox, PA-C   celecoxib (CELEBREX) 200 MG capsule Take 1 capsule (200 mg total) by mouth 2 (two) times daily. 20 capsule Ellsworth Lennox, PA-C   predniSONE (DELTASONE) 10 MG tablet Take 1 tablet (10 mg total) by mouth in the morning, at noon, and at bedtime. 15 tablet Ellsworth Lennox, PA-C      PDMP not reviewed this encounter.   Ellsworth Lennox, PA-C 08/04/22 1038

## 2022-08-04 NOTE — Discharge Instructions (Addendum)
Advised to continue using albuterol inhaler, 2 puffs every 6 hours on a regular basis to help decrease wheezing and shortness of breath. Advised to continue using the air duo every 12 hours to help reduce the wheezing and shortness of breath. Advised take prednisone 10 mg 3 times a day until completed as this will help reduce the asthma exacerbation and also the lower back pain. Advised to take the Celebrex 200 mg twice daily as this will help decrease lower back pain.   Next advised follow-up PCP or return to urgent care if symptoms fail to improve.

## 2022-08-04 NOTE — ED Triage Notes (Signed)
Pt reports with Asthma flare-up. He is using his inhaler with no relief. Pt reports coughing and wheezing.   Pt reports lower back pain for several months.

## 2022-10-21 ENCOUNTER — Encounter (HOSPITAL_COMMUNITY): Payer: Self-pay | Admitting: *Deleted

## 2022-10-21 ENCOUNTER — Telehealth (HOSPITAL_COMMUNITY): Payer: Self-pay | Admitting: Internal Medicine

## 2022-10-21 ENCOUNTER — Ambulatory Visit (HOSPITAL_COMMUNITY)
Admission: EM | Admit: 2022-10-21 | Discharge: 2022-10-21 | Disposition: A | Discharge status: Home / Self Care | Payer: BC Managed Care – PPO | Attending: Internal Medicine | Admitting: Internal Medicine

## 2022-10-21 DIAGNOSIS — R52 Pain, unspecified: Secondary | ICD-10-CM | POA: Diagnosis not present

## 2022-10-21 DIAGNOSIS — Z1152 Encounter for screening for COVID-19: Secondary | ICD-10-CM | POA: Insufficient documentation

## 2022-10-21 DIAGNOSIS — R509 Fever, unspecified: Secondary | ICD-10-CM | POA: Diagnosis not present

## 2022-10-21 DIAGNOSIS — J069 Acute upper respiratory infection, unspecified: Secondary | ICD-10-CM

## 2022-10-21 DIAGNOSIS — Z79899 Other long term (current) drug therapy: Secondary | ICD-10-CM | POA: Diagnosis not present

## 2022-10-21 DIAGNOSIS — R059 Cough, unspecified: Secondary | ICD-10-CM | POA: Insufficient documentation

## 2022-10-21 DIAGNOSIS — J101 Influenza due to other identified influenza virus with other respiratory manifestations: Secondary | ICD-10-CM

## 2022-10-21 LAB — RESP PANEL BY RT-PCR (FLU A&B, COVID) ARPGX2
Influenza A by PCR: POSITIVE — AB
Influenza B by PCR: NEGATIVE
SARS Coronavirus 2 by RT PCR: NEGATIVE

## 2022-10-21 MED ORDER — BENZONATATE 100 MG PO CAPS: 100.0000 mg | ORAL_CAPSULE | Freq: Three times a day (TID) | ORAL | 0 refills | Status: DC

## 2022-10-21 MED ORDER — IBUPROFEN 800 MG PO TABS
ORAL_TABLET | ORAL | Status: AC
  Filled 2022-10-21: qty 1

## 2022-10-21 MED ORDER — IBUPROFEN 800 MG PO TABS
800.0000 mg | ORAL_TABLET | Freq: Once | ORAL | Status: AC
  Administered 2022-10-21: 800 mg via ORAL

## 2022-10-21 MED ORDER — OSELTAMIVIR PHOSPHATE 75 MG PO CAPS: 75.0000 mg | ORAL_CAPSULE | Freq: Two times a day (BID) | ORAL | 0 refills | Status: DC

## 2022-10-21 NOTE — Discharge Instructions (Signed)
You have a viral upper respiratory infection.  COVID-19 and influenza testing is pending. We will call you with results if positive. If your COVID test is positive, you must stay at home until day 6 of symptoms. On day 6, you may go out into public and go back to work, but you must wear a mask until day 11 of symptoms to prevent spread to others.  You may continue taking theraflu over the counter as needed for cold symptoms.  Take tessalon pearles every 8 hours as needed for cough.  Ibuprofen 600mg  every 6 hours with food as needed for fever/chills, sore throat, aches/pains, and inflammation associated with viral illness. Take this with food to avoid stomach upset.    You may do salt water and baking soda gargles every 4 hours as needed for your throat pain.  Please put 1 teaspoon of salt and 1/2 teaspoon of baking soda in 8 ounces of warm water then gargle and spit the water out. You may also put 1 tablespoon of honey in warm water and drink this to soothe your throat.  Place a humidifier in your room at night to help decrease dry air that can irritate your airway and cause you to have a sore throat and cough.  Please try to eat a well-balanced diet while you are sick so that your body gets proper nutrition to heal.  If you develop any new or worsening symptoms, please return.  If your symptoms are severe, please go to the emergency room.  Follow-up with your primary care provider for further evaluation and management of your symptoms as well as ongoing wellness visits.  I hope you feel better!

## 2022-10-21 NOTE — ED Provider Notes (Signed)
[facility]    CSN: [id number] Arrival date & time: 10/21/22  0800      History   Chief Complaint Chief Complaint  Patient presents with   Cough   Nasal Congestion   Headache    HPI Kevin Fitzgerald is a 61 y.o. male.   Patient presents urgent care for evaluation of dry cough, nasal congestion, body aches, fever, chills, and generalized weakness that started yesterday.  Denies sore throat, nausea, vomiting, abdominal pain, difficulty breathing, chest pain, heart palpitations, and dizziness.  He brought his grandson to urgent care yesterday who was diagnosed with influenza.  Patient believes that he may have caught the flu from the urgent care waiting room.  He has a history of COPD and asthma, current cigarette smoker.  Denies all other drug use.  He is vaccinated against COVID-19 and influenza.  He took a dose of TheraFlu shortly prior to arrival urgent care and states this helped a little bit with his symptoms.  He also used his albuterol inhaler this morning at 4 AM and states this helped a little bit with his cough.   Cough Associated symptoms: headaches   Headache Associated symptoms: cough     Past Medical History:  Diagnosis Date   Arthritis    knee   Asthma    as child   COPD (chronic obstructive pulmonary disease) (HCC)    Hand fracture, right    Hypercholesteremia     Patient Active Problem List   Diagnosis Date Noted   Knee osteoarthritis 01/20/2021   Asthma    Acute exacerbation of COPD with asthma (HCC) 02/14/2020   Hyperlipemia 07/02/2018   Erectile dysfunction 05/16/2017   Tobacco use disorder 05/16/2017   MDD (major depressive disorder) 09/12/2015    Past Surgical History:  Procedure Laterality Date   FINGER ARTHRODESIS Right 12/13/2015   Procedure: RIGHT 4TH AND 5TH CARPOMETACARPAL ARTHRODESIS;  Surgeon: Tarry Kos, MD;  Location: [facility];  Service: Orthopedics;  Laterality: Right;   TRANSURETHRAL RESECTION OF  PROSTATE N/A 09/22/2013   Procedure: TRANSURETHRAL RESECTION OF THE PROSTATE WITH GYRUS INSTRUMENTS;  Surgeon: Sebastian Ache, MD;  Location: [facility];  Service: Urology;  Laterality: N/A;       Home Medications    Prior to Admission medications   Medication Sig Start Date End Date Taking? Authorizing Provider  acetaminophen (TYLENOL) 500 MG tablet Take 500 mg by mouth every 6 (six) hours as needed for moderate pain.   Yes [provider]  albuterol (VENTOLIN HFA) 108 (90 Base) MCG/ACT inhaler Inhale 1-2 puffs into the lungs every 6 (six) hours as needed for wheezing or shortness of breath. 08/04/22  Yes Ellsworth Lennox, PA-C  Ascorbic Acid (VITAMIN C PO) Take 1 tablet by mouth daily.   Yes [provider]  benzonatate (TESSALON) 100 MG capsule Take 1 capsule (100 mg total) by mouth every 8 (eight) hours. 10/21/22  Yes Carlisle Beers, FNP  celecoxib (CELEBREX) 200 MG capsule Take 1 capsule (200 mg total) by mouth 2 (two) times daily. 08/04/22  Yes Ellsworth Lennox, PA-C  cholecalciferol (VITAMIN D3) 25 MCG (1000 UNIT) tablet Take 1,000 Units by mouth daily.   Yes [provider]  diclofenac Sodium (VOLTAREN) 1 % GEL Use four times daily as needed for right knee pain 03/09/21  Yes Westley Chandler, MD  OVER THE COUNTER MEDICATION Take 1 capsule by mouth daily.   Yes [provider]  tadalafil (CIALIS) 10 MG tablet  Take 1 tablet (10 mg total) by mouth daily as needed for erectile dysfunction. 03/09/21  Yes Westley Chandler, MD  vitamin B-12 (CYANOCOBALAMIN) 100 MCG tablet Take 100 mcg by mouth daily.   Yes [provider]  VITAMIN E PO Take 1 tablet by mouth daily.   Yes [provider]  Fluticasone-Salmeterol (AIRDUO RESPICLICK 113/14) 113-14 MCG/ACT AEPB Inhale 1 Inhalation into the lungs in the morning and at bedtime. 08/04/22   Ellsworth Lennox, PA-C  predniSONE (DELTASONE) 10 MG tablet Take 1 tablet (10 mg total) by mouth in the morning, at noon, and  at bedtime. 08/04/22   Ellsworth Lennox, PA-C    Family History Family History  Problem Relation Age of Onset   Cancer Mother    Asthma Mother    Stroke Mother    Diabetes Mother    Heart attack Father    Diabetes Brother    Asthma Brother    Birth defects Brother    Heart disease Brother     Social History Social History   Tobacco Use   Smoking status: Every Day    Packs/day: 0.10    Years: 35.00    Total pack years: 3.50    Types: Cigarettes   Smokeless tobacco: Never   Tobacco comments:    Previously 1 ppd smoker - [place] - Quit many times for > 3 months.   Vaping Use   Vaping Use: Never used  Substance Use Topics   Alcohol use: Yes    Comment: occ   Drug use: No     Allergies   Patient has no known allergies.   Review of Systems Review of Systems  Respiratory:  Positive for cough.   Neurological:  Positive for headaches.  Per HPI   Physical Exam Triage Vital Signs ED Triage Vitals  Enc Vitals Group     BP 10/21/22 0824 113/74     Pulse Rate 10/21/22 0824 88     Resp 10/21/22 0824 18     Temp 10/21/22 0824 99.5 F (37.5 C)     Temp Source 10/21/22 0824 Oral     SpO2 10/21/22 0824 95 %     Weight --      Height --      Head Circumference --      Peak Flow --      Pain Score 10/21/22 0823 9     Pain Loc --      Pain Edu? --      Excl. in [place]? --    No data found.  Updated Vital Signs BP 113/74 (BP Location: Right Arm)   Pulse 88   Temp 99.5 F (37.5 C) (Oral)   Resp 18   SpO2 95%   Visual Acuity Right Eye Distance:   Left Eye Distance:   Bilateral Distance:    Right Eye Near:   Left Eye Near:    Bilateral Near:     Physical Exam Vitals and nursing note reviewed.  Constitutional:      Appearance: He is not ill-appearing or toxic-appearing.  HENT:     Head: Normocephalic and atraumatic.     Right Ear: Hearing, tympanic membrane, ear canal and external ear normal.     Left Ear: Hearing, tympanic membrane, ear canal and external  ear normal.     Nose: Congestion present.     Mouth/Throat:     Lips: Pink.     Mouth: Mucous membranes are moist.     Pharynx: Posterior  oropharyngeal erythema present.     Comments: Small amount of clear postnasal drainage visualized to the posterior oropharynx.  Eyes:     General: Lids are normal. Vision grossly intact. Gaze aligned appropriately.        Right eye: No discharge.        Left eye: No discharge.     Extraocular Movements: Extraocular movements intact.     Conjunctiva/sclera: Conjunctivae normal.  Cardiovascular:     Rate and Rhythm: Normal rate and regular rhythm.     Heart sounds: Normal heart sounds, S1 normal and S2 normal.  Pulmonary:     Effort: Pulmonary effort is normal. No respiratory distress.     Breath sounds: Normal breath sounds and air entry.  Musculoskeletal:     Cervical back: Neck supple.     Right lower leg: No edema.     Left lower leg: No edema.  Lymphadenopathy:     Cervical: Cervical adenopathy present.  Skin:    General: Skin is warm and dry.     Capillary Refill: Capillary refill takes less than 2 seconds.     Findings: No rash.  Neurological:     General: No focal deficit present.     Mental Status: He is alert and oriented to person, place, and time. Mental status is at baseline.     Cranial Nerves: No dysarthria or facial asymmetry.  Psychiatric:        Mood and Affect: Mood normal.        Speech: Speech normal.        Behavior: Behavior normal.        Thought Content: Thought content normal.        Judgment: Judgment normal.      UC Treatments / Results  Labs (all labs ordered are listed, but only abnormal results are displayed) Labs Reviewed  RESP PANEL BY RT-PCR (FLU A&B, COVID) [id number]    EKG   Radiology No results found.  Procedures Procedures (including critical care time)  Medications Ordered in UC Medications  ibuprofen (ADVIL) tablet 800 mg (has no administration in time range)    Initial Impression /  Assessment and Plan / UC Course  I have reviewed the triage vital signs and the nursing notes.  Pertinent labs & imaging results that were available during my care of the patient were reviewed by me and considered in my medical decision making (see chart for details).   Viral URI with cough Symptoms and physical exam consistent with a viral upper respiratory tract infection that will likely resolve with rest, fluids, and prescriptions for symptomatic relief. No indication for imaging today based on stable cardiopulmonary exam and hemodynamically stable vital signs.  COVID-19 and influenza PCR testing is pending.  We will call patient if this is positive.  Quarantine guidelines discussed. Currently on day 2 of symptoms and does qualify for antiviral therapy.  He may have molnupiravir if he test positive for COVID.  May have Tamiflu if he test positive for influenza.  Patient given ibuprofen 800 mg in clinic today for body aches.  Tessalon Perles sent to pharmacy for symptomatic relief to be taken as prescribed.  May continue using TheraFlu as needed.  May use ibuprofen/tylenol over the counter for body aches, fever/chills, and overall discomfort associated with viral illness. Nonpharmacologic interventions for symptom relief provided and after visit summary below.   Strict ED/urgent care return precautions given.  Patient verbalizes understanding and agreement with plan.  Counseled patient regarding possible  side effects and uses of all medications prescribed at today's visit.  Patient verbalizes understanding and agreement with plan.  All questions answered.  Patient discharged from urgent care in stable condition.        Final Clinical Impressions(s) / UC Diagnoses   Final diagnoses:  Viral URI with cough  Body aches  Fever, unspecified     Discharge Instructions      You have a viral upper respiratory infection.  COVID-19 and influenza testing is pending. We will call you with  results if positive. If your COVID test is positive, you must stay at home until day 6 of symptoms. On day 6, you may go out into public and go back to work, but you must wear a mask until day 11 of symptoms to prevent spread to others.  You may continue taking theraflu over the counter as needed for cold symptoms.  Take tessalon pearles every 8 hours as needed for cough.  Ibuprofen 600mg  every 6 hours with food as needed for fever/chills, sore throat, aches/pains, and inflammation associated with viral illness. Take this with food to avoid stomach upset.    You may do salt water and baking soda gargles every 4 hours as needed for your throat pain.  Please put 1 teaspoon of salt and 1/2 teaspoon of baking soda in 8 ounces of warm water then gargle and spit the water out. You may also put 1 tablespoon of honey in warm water and drink this to soothe your throat.  Place a humidifier in your room at night to help decrease dry air that can irritate your airway and cause you to have a sore throat and cough.  Please try to eat a well-balanced diet while you are sick so that your body gets proper nutrition to heal.  If you develop any new or worsening symptoms, please return.  If your symptoms are severe, please go to the emergency room.  Follow-up with your primary care provider for further evaluation and management of your symptoms as well as ongoing wellness visits.  I hope you feel better!    ED Prescriptions     Medication Sig Dispense Auth. Provider   benzonatate (TESSALON) 100 MG capsule Take 1 capsule (100 mg total) by mouth every 8 (eight) hours. 21 capsule Carlisle BeersStanhope, Ogle Hoeffner M, FNP      PDMP not reviewed this encounter.   Reita MayStanhope, Jayelyn Barno [place], [place]NP 10/21/22 [phone]

## 2022-10-21 NOTE — Telephone Encounter (Signed)
Saw patient this morning at urgent care and tested for COVID-19 and influenza by PCR.  Patient tested positive for influenza A, may have Tamiflu to reduce severity of symptoms.  Tamiflu sent to pharmacy.  Discussed results with patient who expresses understanding and agreement with plan to use Tamiflu.  Return precautions discussed.

## 2022-10-21 NOTE — ED Triage Notes (Signed)
Pt states that he has had a cough, congestion, headache and chills since yesterday afternoon. He has taken tylenol and cold and flu meds without relief. He used his albuterol MDI at 4am.   Pt states he was here at [facility] yesterday with his grandson who was dx with Flu.

## 2023-02-04 ENCOUNTER — Ambulatory Visit: Payer: BC Managed Care – PPO

## 2023-02-04 ENCOUNTER — Ambulatory Visit (INDEPENDENT_AMBULATORY_CARE_PROVIDER_SITE_OTHER): Payer: BC Managed Care – PPO | Admitting: Student

## 2023-02-04 ENCOUNTER — Encounter: Payer: Self-pay | Admitting: Student

## 2023-02-04 VITALS — BP 153/94 | HR 61 | Ht 66.0 in | Wt 156.4 lb

## 2023-02-04 DIAGNOSIS — G44201 Tension-type headache, unspecified, intractable: Secondary | ICD-10-CM | POA: Diagnosis not present

## 2023-02-04 DIAGNOSIS — H539 Unspecified visual disturbance: Secondary | ICD-10-CM

## 2023-02-04 NOTE — Assessment & Plan Note (Signed)
Red flag of new onset headache after age 62. No neurologic deficits except 2-3 beats of horizontal nystagmus and  visual deficits due to ?cataracts. Well appearing and nontoxic today. -CT Head WO contrast -Ophthalmology referral -Tylenol, ibuprofen or excedrin tension PRN -ED/return precautions discussed

## 2023-02-04 NOTE — Patient Instructions (Addendum)
It was great to see you! Thank you for allowing me to participate in your care!   Our plans for today:  - I am referring you to an ophthalmologist for your vision - I am getting a CT Head for you as well -Return to care if having speech difficulties, sided weakness, vomiting, fevers, worsening headache  Take care and seek immediate care sooner if you develop any concerns.  Gerrit Heck, MD

## 2023-02-04 NOTE — Progress Notes (Signed)
    SUBJECTIVE:   CHIEF COMPLAINT / HPI: HEADACHE   Started having headaches in the past few years. Never had any as a kid/young adult. Last one was last week and when he got it, it lasted for a week,  Onset: 1 week ago   Location: around his orbits b/l and temples  Duration: lasts all day Interference with usual activities: no he is able to go to work Similarity to prior headaches: unsure Precipitating factors: unsure of any Frequency & Type of analgesic use & duration: ibuprofen or tylenol PM whenever he gets it-helps Lightheaded, feels like has to pass out from time to time with the headaches Denies any spinning of the room or ear pain/ringing   Prior Diagnostic work-up: No CT head imaging seen   Associated Symptoms Nausea/vomiting: no  Photophobia/phonophobia: yes, photophobia; not sounds  Change in vision: no auras, blurry sometimes Change in speech: slurring with headaches last week-yesterday Numbness or weakness in limb: no Family hx migraine: no  Personal stressors: yes, grandkids   Red Flags Morning Headache: yes-sometimes wakes him up New onset after 50: yes Fever: no  Neck pain/stiffness: no Diplopia.no  Temple pain/tenderness. no   Jaw Claudication. no   Shoulder pain/stiffness.no   Vision/speech/swallow/hearing difficulty: no, says he had some slurring of speech? But he is unsure of this  Focal weakness/numbness: no  Altered mental status: no  Trauma: no  Anticoagulant use: no  H/o cancer/HIV/Pregnancy: no    PERTINENT  PMH / PSH: smoker (1-1.5 ppd)  OBJECTIVE:   BP (!) 153/94   Pulse 61   Ht 5\' 6"  (1.676 m)   Wt 156 lb 6 oz (70.9 kg)   SpO2 99%   BMI 25.24 kg/m   Gen: NAD, awake alert and conversant Ears: TM clear Neuro: CN II: Pupils symmetric CN III, IV,VI: EOMI, 2-3 beats of horizontal nystagmus when looking to the right that resolves CV V: Normal sensation in V1, V2, V3 CVII: Symmetric smile and brow raise CN VIII: Normal hearing CN  IX,X: Symmetric palate raise  CN XI: 5/5 shoulder shrug CN XII: Symmetric tongue protrusion  UE and LE strength 5/5 Normal sensation in UE and LE bilaterally  No ataxia with finger to nose but limited due to vision issues Visual fields: With right eye covered- visual field deficit on right side of left eye (able to see on left) With left eye covered- unable to see any visual fields-only seeing shadows and lights  ASSESSMENT/PLAN:   Acute intractable tension-type headache Red flag of new onset headache after age 35. No neurologic deficits except 2-3 beats of horizontal nystagmus and  visual deficits due to ?cataracts. Well appearing and nontoxic today. -CT Head WO contrast -Ophthalmology referral -Tylenol, ibuprofen or excedrin tension PRN -ED/return precautions discussed   Vision abnormalities Unable to see out of right eye (shadows and lights) ongoign for a long time per patien. Sees out of left eye but some peripheral vision deficit on right side of visual field. -Ophthalmology referral    Gerrit Heck, MD [place]

## 2023-02-04 NOTE — Assessment & Plan Note (Signed)
Unable to see out of right eye (shadows and lights) ongoign for a long time per patien. Sees out of left eye but some peripheral vision deficit on right side of visual field. -Ophthalmology referral

## 2023-02-20 ENCOUNTER — Ambulatory Visit (HOSPITAL_COMMUNITY): Payer: BC Managed Care – PPO

## 2023-08-03 ENCOUNTER — Ambulatory Visit
Admission: EM | Admit: 2023-08-03 | Discharge: 2023-08-03 | Disposition: A | Discharge status: Home / Self Care | Payer: BC Managed Care – PPO

## 2023-08-03 DIAGNOSIS — J45901 Unspecified asthma with (acute) exacerbation: Secondary | ICD-10-CM

## 2023-08-03 DIAGNOSIS — J441 Chronic obstructive pulmonary disease with (acute) exacerbation: Secondary | ICD-10-CM | POA: Diagnosis not present

## 2023-08-03 MED ORDER — PREDNISONE 10 MG (21) PO TBPK: ORAL_TABLET | ORAL | 0 refills | Status: DC

## 2023-08-03 MED ORDER — AMOXICILLIN-POT CLAVULANATE 875-125 MG PO TABS: 1.0000 | ORAL_TABLET | Freq: Two times a day (BID) | ORAL | 0 refills | Status: DC

## 2023-08-03 MED ORDER — ALBUTEROL SULFATE HFA 108 (90 BASE) MCG/ACT IN AERS: 1.0000 | INHALATION_SPRAY | Freq: Four times a day (QID) | RESPIRATORY_TRACT | 0 refills | Status: DC | PRN

## 2023-08-03 MED ORDER — BUDESONIDE-FORMOTEROL FUMARATE 160-4.5 MCG/ACT IN AERO: 2.0000 | INHALATION_SPRAY | Freq: Two times a day (BID) | RESPIRATORY_TRACT | 0 refills | Status: DC

## 2023-08-03 NOTE — ED Triage Notes (Signed)
"  My asthma is flaring up, this started yesterday, most of my symptoms are hard to breath, tired and migraine headaches with it too". No runny nose. No cough. No fever.   Medication refills needed: Prednisone.

## 2023-08-03 NOTE — Discharge Instructions (Signed)
We are treating you for COPD/asthma flare.  Please start prednisone.  Do not take NSAIDs with this medication including aspirin, ibuprofen/Advil, naproxen/Aleve.  Use albuterol every 4-6 hours as needed for shortness of breath and coughing fits.  Use Augmentin twice daily for 7 days to cover for infection.  Start Symbicort twice daily for breathing symptoms.  Rinse your mouth following use of this medication to prevent thrush.  If your symptoms are not improving within a few days please return for reevaluation.  Follow-up with your primary care as soon as possible.  If anything worsens and you have worsening cough, increased sputum production, fever, chest pain, shortness of breath you need to be seen immediately.

## 2023-08-03 NOTE — ED Provider Notes (Signed)
[name]    CSN: [id number] Arrival date & time: 08/03/23  1257      History   Chief Complaint Chief Complaint  Patient presents with   Medication Refill   Asthma Exacerbation    HPI Kevin Fitzgerald is a 62 y.o. male.   Patient presents today with a 1 day history of asthma exacerbation.  He has a history of asthma and COPD.  He is a current everyday smoker smoking 2 to 3 cigarettes which is decreased from his previous 1 pack/day.  He has been using over-the-counter inhaler as he ran out of his previously prescribed medications.  Denies any recent antibiotics or steroids.  He does have a mild headache as well as cough but denies any significant congestion, fever, nausea, vomiting.  Denies any known sick contacts.  He has not tried any over-the-counter medication for symptom management.  Denies recent hospitalization for asthma.    Past Medical History:  Diagnosis Date   Arthritis    knee   Asthma    as child   COPD (chronic obstructive pulmonary disease) (HCC)    Hand fracture, right    Hypercholesteremia     Patient Active Problem List   Diagnosis Date Noted   Acute intractable tension-type headache 02/04/2023   Vision abnormalities 02/04/2023   Knee osteoarthritis 01/20/2021   Asthma    Acute exacerbation of COPD with asthma (HCC) 02/14/2020   Hyperlipemia 07/02/2018   Erectile dysfunction 05/16/2017   Tobacco use disorder 05/16/2017   MDD (major depressive disorder) 09/12/2015    Past Surgical History:  Procedure Laterality Date   FINGER ARTHRODESIS Right 12/13/2015   Procedure: RIGHT 4TH AND 5TH CARPOMETACARPAL ARTHRODESIS;  Surgeon: Tarry Kos, MD;  Location: [facility];  Service: Orthopedics;  Laterality: Right;   TRANSURETHRAL RESECTION OF PROSTATE N/A 09/22/2013   Procedure: TRANSURETHRAL RESECTION OF THE PROSTATE WITH GYRUS INSTRUMENTS;  Surgeon: Sebastian Ache, MD;  Location: [facility];  Service: Urology;  Laterality: N/A;        Home Medications    Prior to Admission medications   Medication Sig Start Date End Date Taking? Authorizing Provider  amoxicillin-clavulanate (AUGMENTIN) 875-125 MG tablet Take 1 tablet by mouth every 12 (twelve) hours. 08/03/23  Yes Gearldene Fiorenza, Noberto Retort, PA-C  Ascorbic Acid (VITAMIN C PO) Take 1 tablet by mouth daily.   Yes [provider]  budesonide-formoterol (SYMBICORT) 160-4.5 MCG/ACT inhaler Inhale 2 puffs into the lungs in the morning and at bedtime. 08/03/23  Yes Caniya Tagle, Noberto Retort, PA-C  cholecalciferol (VITAMIN D3) 25 MCG (1000 UNIT) tablet Take 1,000 Units by mouth daily.   Yes [provider]  predniSONE (STERAPRED UNI-PAK 21 TAB) 10 MG (21) TBPK tablet As directed 08/03/23  Yes Arnita Koons K, PA-C  UNABLE TO FIND Med Name: Vitamin B   Yes [provider]  acetaminophen (TYLENOL) 500 MG tablet Take 500 mg by mouth every 6 (six) hours as needed for moderate pain.    [provider]  albuterol (VENTOLIN HFA) 108 (90 Base) MCG/ACT inhaler Inhale 1-2 puffs into the lungs every 6 (six) hours as needed for wheezing or shortness of breath. 08/03/23   Maecie Sevcik K, PA-C  diclofenac Sodium (VOLTAREN) 1 % GEL Use four times daily as needed for right knee pain 03/09/21   Westley Chandler, MD  OVER THE COUNTER MEDICATION Take 1 capsule by mouth daily.    [provider]  tadalafil (CIALIS) 10 MG tablet Take 1 tablet (  10 mg total) by mouth daily as needed for erectile dysfunction. 03/09/21   Westley Chandler, MD  vitamin B-12 (CYANOCOBALAMIN) 100 MCG tablet Take 100 mcg by mouth daily.    [provider]  VITAMIN E PO Take 1 tablet by mouth daily.    [provider]    Family History Family History  Problem Relation Age of Onset   Cancer Mother    Asthma Mother    Stroke Mother    Diabetes Mother    Heart attack Father    Diabetes Brother    Asthma Brother    Birth defects Brother    Heart disease Brother     Social  History Social History   Tobacco Use   Smoking status: Every Day    Current packs/day: 0.10    Average packs/day: 0.1 packs/day for 35.0 years (3.5 ttl pk-yrs)    Types: Cigarettes   Smokeless tobacco: Never   Tobacco comments:    Previously 1 ppd smoker - [employer] - Quit many times for > 3 months.   Vaping Use   Vaping status: Never Used  Substance Use Topics   Alcohol use: Yes    Comment: Occassionally.   Drug use: No     Allergies   Patient has no known allergies.   Review of Systems Review of Systems  Constitutional:  Positive for activity change. Negative for appetite change, fatigue and fever.  HENT:  Negative for congestion and sore throat.   Respiratory:  Positive for cough and shortness of breath.   Cardiovascular:  Negative for chest pain.  Gastrointestinal:  Negative for abdominal pain, diarrhea, nausea and vomiting.  Neurological:  Positive for headaches (Related to coughing). Negative for dizziness and light-headedness.     Physical Exam Triage Vital Signs ED Triage Vitals  Encounter Vitals Group     BP 08/03/23 1305 128/70     Systolic BP Percentile --      Diastolic BP Percentile --      Pulse Rate 08/03/23 1305 70     Resp 08/03/23 1305 18     Temp 08/03/23 1305 97.9 F (36.6 C)     Temp Source 08/03/23 1305 Oral     SpO2 08/03/23 1305 99 %     Weight 08/03/23 1302 156 lb (70.8 kg)     Height 08/03/23 1302 5\' 6"  (1.676 m)     Head Circumference --      Peak Flow --      Pain Score 08/03/23 1302 0     Pain Loc --      Pain Education --      Exclude from Growth Chart --    No data found.  Updated Vital Signs BP 128/70 (BP Location: Left Arm)   Pulse 70   Temp 97.9 F (36.6 C) (Oral)   Resp 18   Ht 5\' 6"  (1.676 m)   Wt 156 lb (70.8 kg)   SpO2 99%   BMI 25.18 kg/m   Visual Acuity Right Eye Distance:   Left Eye Distance:   Bilateral Distance:    Right Eye Near:   Left Eye Near:    Bilateral Near:     Physical Exam Vitals  reviewed.  Constitutional:      General: He is awake.     Appearance: Normal appearance. He is well-developed. He is not ill-appearing.     Comments: Very pleasant male appears stated age in no acute distress sitting comfortably in exam room  HENT:     Head: Normocephalic and atraumatic.     Right Ear: Tympanic membrane, ear canal and external ear normal. Tympanic membrane is not erythematous or bulging.     Left Ear: Tympanic membrane, ear canal and external ear normal. Tympanic membrane is not erythematous or bulging.     Nose: Nose normal.     Mouth/Throat:     Dentition: Abnormal dentition.     Pharynx: Uvula midline. No oropharyngeal exudate, posterior oropharyngeal erythema or uvula swelling.  Cardiovascular:     Rate and Rhythm: Normal rate and regular rhythm.     Heart sounds: Normal heart sounds, S1 normal and S2 normal. No murmur heard. Pulmonary:     Effort: Pulmonary effort is normal. No accessory muscle usage or respiratory distress.     Breath sounds: No stridor. Examination of the right-lower field reveals decreased breath sounds. Examination of the left-lower field reveals decreased breath sounds. Decreased breath sounds present. No wheezing, rhonchi or rales.  Neurological:     Mental Status: He is alert.  Psychiatric:        Behavior: Behavior is cooperative.      UC Treatments / Results  Labs (all labs ordered are listed, but only abnormal results are displayed) Labs Reviewed - No data to display  EKG   Radiology No results found.  Procedures Procedures (including critical care time)  Medications Ordered in UC Medications - No data to display  Initial Impression / Assessment and Plan / UC Course  I have reviewed the triage vital signs and the nursing notes.  Pertinent labs & imaging results that were available during my care of the patient were reviewed by me and considered in my medical decision making (see chart for details).     Patient is  well-appearing, afebrile, nontoxic, nontachycardic.  Viral testing was deferred as he has no significant congestion or fever on reports current symptoms are similar to previous asthma/COPD exacerbations.  Chest x-ray was deferred as oxygen saturation was 99%.  Offered in office DuoNeb given decreased aeration of bilateral bases but he declined this.  Albuterol was sent to pharmacy with instruction to use this every 4-6 hours as needed.  Will also start Symbicort we discussed that he should rinse his mouth following use of this medication to prevent thrush.  Given his history of COPD with increasing cough/sputum production will cover with Augmentin twice daily.  Prednisone taper was sent to pharmacy and we discussed that he is not to take NSAIDs with this medication.  Recommend close follow-up with his primary care.  If he has any worsening or changing symptoms he needs to be seen immediately.  Strict return precautions given.  Offered work excuse note which she declined.  Final Clinical Impressions(s) / UC Diagnoses   Final diagnoses:  Acute exacerbation of COPD with asthma [facility])     Discharge Instructions      We are treating you for COPD/asthma flare.  Please start prednisone.  Do not take NSAIDs with this medication including aspirin, ibuprofen/Advil, naproxen/Aleve.  Use albuterol every 4-6 hours as needed for shortness of breath and coughing fits.  Use Augmentin twice daily for 7 days to cover for infection.  Start Symbicort twice daily for breathing symptoms.  Rinse your mouth following use of this medication to prevent thrush.  If your symptoms are not improving within a few days please return for reevaluation.  Follow-up with your primary care as soon as possible.  If anything worsens and you have  worsening cough, increased sputum production, fever, chest pain, shortness of breath you need to be seen immediately.     ED Prescriptions     Medication Sig Dispense Auth. Provider   albuterol  (VENTOLIN HFA) 108 (90 Base) MCG/ACT inhaler Inhale 1-2 puffs into the lungs every 6 (six) hours as needed for wheezing or shortness of breath. 18 g Freeman Borba K, PA-C   predniSONE (STERAPRED UNI-PAK 21 TAB) 10 MG (21) TBPK tablet As directed 21 tablet Sheridan Gettel K, PA-C   budesonide-formoterol (SYMBICORT) 160-4.5 MCG/ACT inhaler Inhale 2 puffs into the lungs in the morning and at bedtime. 1 each Astra Gregg K, PA-C   amoxicillin-clavulanate (AUGMENTIN) 875-125 MG tablet Take 1 tablet by mouth every 12 (twelve) hours. 14 tablet Braycen Burandt, Noberto Retort, PA-C      PDMP not reviewed this encounter.   Jeani Hawking, PA-C 08/03/23 1331

## 2023-08-05 ENCOUNTER — Ambulatory Visit (HOSPITAL_COMMUNITY)
Admission: EM | Admit: 2023-08-05 | Discharge: 2023-08-05 | Disposition: A | Discharge status: Home / Self Care | Payer: BC Managed Care – PPO | Attending: Family Medicine | Admitting: Family Medicine

## 2023-08-05 ENCOUNTER — Encounter (HOSPITAL_COMMUNITY): Payer: Self-pay

## 2023-08-05 DIAGNOSIS — M79644 Pain in right finger(s): Secondary | ICD-10-CM

## 2023-08-05 DIAGNOSIS — Z23 Encounter for immunization: Secondary | ICD-10-CM | POA: Diagnosis not present

## 2023-08-05 MED ORDER — TETANUS-DIPHTH-ACELL PERTUSSIS 5-2.5-18.5 LF-MCG/0.5 IM SUSY
PREFILLED_SYRINGE | INTRAMUSCULAR | Status: AC
  Filled 2023-08-05: qty 0.5

## 2023-08-05 MED ORDER — AMOXICILLIN-POT CLAVULANATE 875-125 MG PO TABS: 1.0000 | ORAL_TABLET | Freq: Two times a day (BID) | ORAL | 0 refills | Status: AC

## 2023-08-05 MED ORDER — TETANUS-DIPHTH-ACELL PERTUSSIS 5-2.5-18.5 LF-MCG/0.5 IM SUSY
0.5000 mL | PREFILLED_SYRINGE | Freq: Once | INTRAMUSCULAR | Status: AC
  Administered 2023-08-05: 0.5 mL via INTRAMUSCULAR

## 2023-08-05 NOTE — Discharge Instructions (Signed)
Keep you thumb wound clean and dry until healed.

## 2023-08-05 NOTE — ED Triage Notes (Addendum)
Patient states he was bitten by his grandson's dog to the right thumb. Patient states the dog has not had any vaccinations.  Patient does not know when his last tetanus was.

## 2023-08-06 NOTE — ED Provider Notes (Signed)
  [facility]   [id number] 08/05/23 Arrival Time: 1912  ASSESSMENT & PLAN:  1. Thumb pain, right   Secondary to dog bite. Animal control form completed.  Meds ordered this encounter  Medications   amoxicillin-clavulanate (AUGMENTIN) 875-125 MG tablet    Sig: Take 1 tablet by mouth every 12 (twelve) hours for 7 days.    Dispense:  14 tablet    Refill:  0   Tdap (BOOSTRIX) injection 0.5 mL   Extensively flushed under running water. Steri strips applied to bring egdes closer but to avoid tight closure. Watch for s/s infection.    Discharge Instructions      Keep you thumb wound clean and dry until healed.    Reviewed expectations re: course of current medical issues. Questions answered. Outlined signs and symptoms indicating need for more acute intervention. Patient verbalized understanding. After Visit Summary given.   SUBJECTIVE:  Kevin Fitzgerald is a 62 y.o. male who presents with a laceration of R thumb; dog bite. Today. Mod bleeding; controlled. No extremity sensation changes or weakness. Denies thumb ROM loss. Unsure of last Td.   OBJECTIVE:  Vitals:   08/05/23 1940 08/05/23 1945  BP: (!) 160/94   Pulse: 74   Resp: 14   Temp: 98.2 F (36.8 C)   TempSrc: Oral   SpO2: 98%   Weight:  70.1 kg    General appearance: alert; no distress MSK: linear laceration of inner R thumb; size: approx 1 cm; clean wound edges, no foreign bodies; with active bleeding; R thumb with FROM, normal cap refill, and normal distal sensation Psychological: alert and cooperative; normal mood and affect   S Labs Reviewed - No data to display  No results found.  No Known Allergies  Past Medical History:  Diagnosis Date   Arthritis    knee   Asthma    as child   COPD (chronic obstructive pulmonary disease) (HCC)    Hand fracture, right    Hypercholesteremia    Social History   Socioeconomic History   Marital status: Married    Spouse name: Tarrin Lebow    Number of children: Not on file   Years of education: Not on file   Highest education level: Not on file  Occupational History   Not on file  Tobacco Use   Smoking status: Every Day    Current packs/day: 0.10    Average packs/day: 0.1 packs/day for 35.0 years (3.5 ttl pk-yrs)    Types: Cigarettes   Smokeless tobacco: Never   Tobacco comments:    Previously 1 ppd smoker - [place] - Quit many times for > 3 months.   Vaping Use   Vaping status: Never Used  Substance and Sexual Activity   Alcohol use: Yes    Comment: Occassionally.   Drug use: No   Sexual activity: Yes    Birth control/protection: None  Other Topics Concern   Not on file  Social History Narrative   Lays pipe for work    Social Determinants of Health   Financial Resource Strain: Not on file  Food Insecurity: Not on file  Transportation Needs: Not on file  Physical Activity: Not on file  Stress: Not on file  Social Connections: Not on file          [place], MD 08/06/23 [phone]

## 2023-10-27 ENCOUNTER — Ambulatory Visit: Payer: BC Managed Care – PPO | Admitting: Student

## 2023-10-27 ENCOUNTER — Encounter: Payer: Self-pay | Admitting: Student

## 2023-10-27 VITALS — BP 163/82 | HR 68 | Wt 166.6 lb

## 2023-10-27 DIAGNOSIS — G44201 Tension-type headache, unspecified, intractable: Secondary | ICD-10-CM

## 2023-10-27 NOTE — Progress Notes (Signed)
    SUBJECTIVE:   CHIEF COMPLAINT / HPI:    Patient is a 62 y.o. year old male presenting with headache    Onset: Few months  Location: Frontal and left side  Quality: Aching pain  Frequency: Intermittent last about few hours (not daily) Precipitating factors: working under the sun  Prior treatment: Tylenol provides  Associated Symptoms: blurry vision, photophobia & phonophobia: No nausea or vomiting  Tearing of eyes: No Trauma:  No   Patient reported he was recently seen at headache as I cannot associate for his eye exam we got a new prescription for his vision and informed that he has glaucoma.  No medication or other treatment with started after these findings.   PERTINENT  PMH / PSH: Reviewed  OBJECTIVE:   BP (!) 163/82   Pulse 68   Wt 166 lb 9.6 oz (75.6 kg)   SpO2 100%   BMI 26.89 kg/m    Physical Exam General: Alert, well appearing, NAD Cardiovascular: RRR, No Murmurs, Normal S2/S2 Respiratory: CTAB, No wheezing or Rales Abdomen: No distension or tenderness Extremities: No edema on extremities   Neuro exam: Cranial nerves II to XII intact, no focal deficits  ASSESSMENT/PLAN:   Acute intractable tension-type headache Still have persistent headache with associated visual impairment.  Currently wearing glasses and per patient's report had a recent diagnosis of glaucoma.  His headache could be related to his glaucoma or other possible etiology however patient never completed his head CT.  Given the chronic nature of his headache we will obtain an MRI instead.  Plan is to contact his ophthalmologist for their recommendation about his glaucoma diagnosis. -MRI ordered -ED precautions provided -Patient signed release of document to obtain findings from ophthalmologist.     Jerre Simon, MD [facility]

## 2023-10-27 NOTE — Assessment & Plan Note (Signed)
Still have persistent headache with associated visual impairment.  Currently wearing glasses and per patient's report had a recent diagnosis of glaucoma.  His headache could be related to his glaucoma or other possible etiology however patient never completed his head CT.  Given the chronic nature of his headache we will obtain an MRI instead.  Plan is to contact his ophthalmologist for their recommendation about his glaucoma diagnosis. -MRI ordered -ED precautions provided -Patient signed release of document to obtain findings from ophthalmologist.

## 2023-10-27 NOTE — Patient Instructions (Addendum)
Pleasure to meet your today  Your headache could be related to your Glaucoma   We will reach out to your eye doctor to discuss plan about your Glaucoma   We have placed an order for your Brain MRI this is very important that you complete your MRI  Continue to take Tylenol as needed   Follow up with your PCP after your MRI is completed

## 2023-11-01 ENCOUNTER — Ambulatory Visit (HOSPITAL_COMMUNITY): Payer: BC Managed Care – PPO

## 2023-11-03 ENCOUNTER — Telehealth: Payer: Self-pay | Admitting: Student

## 2023-11-03 NOTE — Telephone Encounter (Signed)
-----   Message from Jerre Simon sent at 10/31/2023  7:29 AM EST ----- Called the number twice, yesterday and this morning and they are closed.   Hey Jag Lenz I am adding you to this because you saw this patient initially for headache and ordered head CT which he never did. He came back due to continuing headache and with Jennette Kettle we decided to do MRI and of course insurance wants a peer to peer. I'm leaving for [place] soon and incase it comes up again while I am away would you mind talking to them. I called them twice and they are closed. Left them a voicemail with clinic number.  Thanks John ----- Message ----- From: Sunday Spillers, CMA Sent: 10/30/2023  10:14 AM EST To: Jerre Simon, MD  Hi!  The MRI you requested for this pt is requiring a peer to peer review. It has been deemed not medically necessary.  Please call [phone] Case # [id number] Pt insurance ID is [id number] DOB 1961/10/13  This is scheduled for 11/01/2023. If this is not approved by the 27 th a 1 pm, it will need to be rescheduled. Please let me know what the outcome is.  Melvenia Beam

## 2023-11-03 NOTE — Telephone Encounter (Signed)
Called number and left voicemail with information and to call back our clinic number. Requesting peer to peer for MRI. Please let me know if call back given.

## 2023-11-06 ENCOUNTER — Telehealth: Payer: Self-pay | Admitting: Student

## 2023-11-06 NOTE — Telephone Encounter (Signed)
 Called peer to peer line provided previously--spoke with nurse on line and the MRI Brain has actually been approved Authorization #[id number]  Authorized dates of October 30 2023 through November 28 2023 Routing to referral coordinator and CMA team to get this schedule for this patient

## 2023-11-07 ENCOUNTER — Telehealth: Payer: Self-pay | Admitting: *Deleted

## 2023-11-07 NOTE — Telephone Encounter (Signed)
Pt informed. Dyshaun Bonzo, CMA  

## 2023-11-07 NOTE — Telephone Encounter (Signed)
-----   Message from [facility] sent at 11/06/2023 12:44 PM EST ----- Regarding: MRI Brain Called peer to peer line provided previously--spoke with nurse on line and the MRI has actually been approved Authorization #[id number]  Authorized dates of October 30 2023 through November 28 2023 Routing to referral coordinator and CMA team to get this schedulde for this patient ----- Message ----- From: Rosendo Rush, MD Sent: 10/31/2023   7:33 AM EST To: Reena ONEIDA Dimes, CMA; Wendel Lesch, MD  Called the number twice, yesterday and this morning and they are closed.   Hey Mayuri I am adding you to this because you saw this patient initially for headache and ordered head CT which he never did. He came back due to continuing headache and with Rosalynn we decided to do MRI and of course insurance wants a peer to peer. I'm leaving for [place] soon and incase it comes up again while I am away would you mind talking to them. I called them twice and they are closed. Left them a voicemail with clinic number.  Thanks John ----- Message ----- From: Dimes Reena ONEIDA, CMA Sent: 10/30/2023  10:14 AM EST To: Rush Rosendo, MD  Hi!  The MRI you requested for this pt is requiring a peer to peer review. It has been deemed not medically necessary.  Please call [phone] Case # [id number] Pt insurance ID is [id number] DOB [birth year]  This is scheduled for 11/01/2023. If this is not approved by the 27 th a 1 pm, it will need to be rescheduled. Please let me know what the outcome is.  Margit

## 2023-11-10 ENCOUNTER — Other Ambulatory Visit: Payer: Self-pay

## 2023-11-12 MED ORDER — BUDESONIDE-FORMOTEROL FUMARATE 160-4.5 MCG/ACT IN AERO: 2.0000 | INHALATION_SPRAY | Freq: Two times a day (BID) | RESPIRATORY_TRACT | 2 refills | Status: AC

## 2023-11-12 MED ORDER — ALBUTEROL SULFATE HFA 108 (90 BASE) MCG/ACT IN AERS: 1.0000 | INHALATION_SPRAY | Freq: Four times a day (QID) | RESPIRATORY_TRACT | 0 refills | Status: DC | PRN

## 2023-11-13 ENCOUNTER — Ambulatory Visit (HOSPITAL_COMMUNITY)
Admission: RE | Admit: 2023-11-13 | Discharge: 2023-11-13 | Disposition: A | Discharge status: Home / Self Care | Payer: BC Managed Care – PPO | Source: Ambulatory Visit | Attending: Family Medicine | Admitting: Family Medicine

## 2023-11-13 DIAGNOSIS — G44201 Tension-type headache, unspecified, intractable: Secondary | ICD-10-CM | POA: Diagnosis not present

## 2023-11-13 DIAGNOSIS — R519 Headache, unspecified: Secondary | ICD-10-CM | POA: Diagnosis not present

## 2023-11-13 DIAGNOSIS — J329 Chronic sinusitis, unspecified: Secondary | ICD-10-CM | POA: Diagnosis not present

## 2023-11-13 DIAGNOSIS — Q048 Other specified congenital malformations of brain: Secondary | ICD-10-CM | POA: Diagnosis not present

## 2023-11-13 MED ORDER — GADOBUTROL 1 MMOL/ML IV SOLN
7.5000 mL | Freq: Once | INTRAVENOUS | Status: AC | PRN
  Administered 2023-11-13: 7.5 mL via INTRAVENOUS

## 2023-12-01 ENCOUNTER — Ambulatory Visit (HOSPITAL_COMMUNITY): Payer: BC Managed Care – PPO

## 2023-12-02 ENCOUNTER — Other Ambulatory Visit: Payer: Self-pay | Admitting: Student

## 2023-12-09 ENCOUNTER — Other Ambulatory Visit: Payer: Self-pay

## 2023-12-09 NOTE — Telephone Encounter (Signed)
 Called to inquire why patient needed refill of albuterol .  Patient recently had a refill of albuterol  3 weeks ago.  Provider would like to know if patient's asthma is worsening, and he needs new albuterol  because he is run out, or if patient has lost inhaler.  If patient has used albuterol  so much that he is run out of the refill, that I just sent 3 weeks ago, patient will need to come in for an appointment to be seen.  Provider has concern for worsening respiratory symptoms that will not be treated with albuterol  alone.  Patient may need more respiratory support/medications

## 2023-12-17 DIAGNOSIS — H401133 Primary open-angle glaucoma, bilateral, severe stage: Secondary | ICD-10-CM | POA: Diagnosis not present

## 2023-12-23 DIAGNOSIS — H401133 Primary open-angle glaucoma, bilateral, severe stage: Secondary | ICD-10-CM | POA: Diagnosis not present

## 2024-01-08 ENCOUNTER — Ambulatory Visit (HOSPITAL_COMMUNITY)

## 2024-01-08 ENCOUNTER — Encounter (HOSPITAL_COMMUNITY): Payer: Self-pay

## 2024-01-08 ENCOUNTER — Ambulatory Visit (HOSPITAL_COMMUNITY): Admission: EM | Admit: 2024-01-08 | Discharge: 2024-01-08 | Disposition: A | Discharge status: Home / Self Care

## 2024-01-08 DIAGNOSIS — J441 Chronic obstructive pulmonary disease with (acute) exacerbation: Secondary | ICD-10-CM

## 2024-01-08 DIAGNOSIS — R059 Cough, unspecified: Secondary | ICD-10-CM | POA: Diagnosis not present

## 2024-01-08 DIAGNOSIS — J988 Other specified respiratory disorders: Secondary | ICD-10-CM

## 2024-01-08 DIAGNOSIS — B9789 Other viral agents as the cause of diseases classified elsewhere: Secondary | ICD-10-CM | POA: Diagnosis not present

## 2024-01-08 DIAGNOSIS — R051 Acute cough: Secondary | ICD-10-CM

## 2024-01-08 DIAGNOSIS — J111 Influenza due to unidentified influenza virus with other respiratory manifestations: Secondary | ICD-10-CM

## 2024-01-08 MED ORDER — IPRATROPIUM-ALBUTEROL 0.5-2.5 (3) MG/3ML IN SOLN
RESPIRATORY_TRACT | Status: AC
  Filled 2024-01-08: qty 3

## 2024-01-08 MED ORDER — PROMETHAZINE-DM 6.25-15 MG/5ML PO SYRP: 5.0000 mL | ORAL_SOLUTION | Freq: Every evening | ORAL | 0 refills | Status: DC | PRN

## 2024-01-08 MED ORDER — IPRATROPIUM-ALBUTEROL 0.5-2.5 (3) MG/3ML IN SOLN
3.0000 mL | Freq: Once | RESPIRATORY_TRACT | Status: AC
  Administered 2024-01-08: 3 mL via RESPIRATORY_TRACT

## 2024-01-08 MED ORDER — PREDNISONE 20 MG PO TABS: 40.0000 mg | ORAL_TABLET | Freq: Every day | ORAL | 0 refills | Status: AC

## 2024-01-08 MED ORDER — BENZONATATE 100 MG PO CAPS: 100.0000 mg | ORAL_CAPSULE | Freq: Three times a day (TID) | ORAL | 0 refills | Status: DC

## 2024-01-08 NOTE — ED Provider Notes (Signed)
 [facility]    CSN: [id number] Arrival date & time: 01/08/24  1707      History   Chief Complaint Chief Complaint  Patient presents with   Cough    HPI Kevin Fitzgerald is a 63 y.o. male.   Patient presents with fever, chills, body aches, cough, runny nose, fatigue, productive breath, and chest tightness that began on 3/3.  Patient endorses chest pain with deep breathing.  Denies severe chest pain, nausea, vomiting, diarrhea, and abdominal pain.  History of COPD.  Patient reports he has been using his albuterol inhaler with minimal relief.   Cough   Past Medical History:  Diagnosis Date   Arthritis    knee   Asthma    as child   COPD (chronic obstructive pulmonary disease) (HCC)    Hand fracture, right    Hypercholesteremia     Patient Active Problem List   Diagnosis Date Noted   Acute intractable tension-type headache 02/04/2023   Vision abnormalities 02/04/2023   Knee osteoarthritis 01/20/2021   Asthma    Acute exacerbation of COPD with asthma (HCC) 02/14/2020   Hyperlipemia 07/02/2018   Erectile dysfunction 05/16/2017   Tobacco use disorder 05/16/2017   MDD (major depressive disorder) 09/12/2015    Past Surgical History:  Procedure Laterality Date   FINGER ARTHRODESIS Right 12/13/2015   Procedure: RIGHT 4TH AND 5TH CARPOMETACARPAL ARTHRODESIS;  Surgeon: Tarry Kos, MD;  Location: [facility];  Service: Orthopedics;  Laterality: Right;   TRANSURETHRAL RESECTION OF PROSTATE N/A 09/22/2013   Procedure: TRANSURETHRAL RESECTION OF THE PROSTATE WITH GYRUS INSTRUMENTS;  Surgeon: Sebastian Ache, MD;  Location: [facility];  Service: Urology;  Laterality: N/A;       Home Medications    Prior to Admission medications   Medication Sig Start Date End Date Taking? Authorizing Provider  acetaminophen (TYLENOL) 500 MG tablet Take 500 mg by mouth every 6 (six) hours as needed for moderate pain.   Yes [provider]  Ascorbic Acid  (VITAMIN C PO) Take 1 tablet by mouth daily.   Yes [provider]  benzonatate (TESSALON) 100 MG capsule Take 1 capsule (100 mg total) by mouth every 8 (eight) hours. 01/08/24  Yes Susann Givens, Teryl Gubler A, NP  brimonidine (ALPHAGAN) 0.2 % ophthalmic solution SMARTSIG:In Eye(s) 12/23/23  Yes [provider]  cholecalciferol (VITAMIN D3) 25 MCG (1000 UNIT) tablet Take 1,000 Units by mouth daily.   Yes [provider]  LUMIGAN 0.01 % SOLN SMARTSIG:In Eye(s) 12/23/23  Yes [provider]  OVER THE COUNTER MEDICATION Take 1 capsule by mouth daily.   Yes [provider]  predniSONE (DELTASONE) 20 MG tablet Take 2 tablets (40 mg total) by mouth daily for 5 days. 01/08/24 01/13/24 Yes Tejay Hubert A, NP  promethazine-dextromethorphan (PROMETHAZINE-DM) 6.25-15 MG/5ML syrup Take 5 mLs by mouth at bedtime as needed for cough. 01/08/24  Yes Letta Kocher, NP  UNABLE TO FIND Med Name: Vitamin B   Yes [provider]  vitamin B-12 (CYANOCOBALAMIN) 100 MCG tablet Take 100 mcg by mouth daily.   Yes [provider]  budesonide-formoterol (SYMBICORT) 160-4.5 MCG/ACT inhaler Inhale 2 puffs into the lungs in the morning and at bedtime. 11/12/23   Bess Kinds, MD  tadalafil (CIALIS) 10 MG tablet Take 1 tablet (10 mg total) by mouth daily as needed for erectile dysfunction. 03/09/21   Westley Chandler, MD  VENTOLIN HFA 108 (90 Base) MCG/ACT inhaler INHALE 1 TO 2 PUFFS BY  MOUTH EVERY 6 HOURS AS NEEDED FOR WHEEZING FOR SHORTNESS OF BREATH 12/10/23   Bess Kinds, MD  VITAMIN E PO Take 1 tablet by mouth daily.    [provider]    Family History Family History  Problem Relation Age of Onset   Cancer Mother    Asthma Mother    Stroke Mother    Diabetes Mother    Heart attack Father    Diabetes Brother    Asthma Brother    Birth defects Brother    Heart disease Brother     Social History Social History   Tobacco Use   Smoking status: Every  Day    Current packs/day: 0.10    Average packs/day: 0.1 packs/day for 35.0 years (3.5 ttl pk-yrs)    Types: Cigarettes   Smokeless tobacco: Never   Tobacco comments:    Previously 1 ppd smoker - [place] - Quit many times for > 3 months.   Vaping Use   Vaping status: Never Used  Substance Use Topics   Alcohol use: Yes    Comment: Occassionally.   Drug use: No     Allergies   Patient has no known allergies.   Review of Systems Review of Systems  Respiratory:  Positive for cough.    Per HPI  Physical Exam Triage Vital Signs ED Triage Vitals  Encounter Vitals Group     BP 01/08/24 1850 111/71     Systolic BP Percentile --      Diastolic BP Percentile --      Pulse Rate 01/08/24 1721 79     Resp 01/08/24 1721 (!) 24     Temp 01/08/24 1850 98.9 F (37.2 C)     Temp Source 01/08/24 1850 Oral     SpO2 01/08/24 1721 98 %     Weight 01/08/24 1850 165 lb (74.8 kg)     Height 01/08/24 1850 5\' 6"  (1.676 m)     Head Circumference --      Peak Flow --      Pain Score 01/08/24 1848 3     Pain Loc --      Pain Education --      Exclude from Growth Chart --    No data found.  Updated Vital Signs BP 111/71 (BP Location: Right Arm)   Pulse 84   Temp 98.9 F (37.2 C) (Oral)   Resp 18   Ht 5\' 6"  (1.676 m)   Wt 165 lb (74.8 kg)   SpO2 97%   BMI 26.63 kg/m   Visual Acuity Right Eye Distance:   Left Eye Distance:   Bilateral Distance:    Right Eye Near:   Left Eye Near:    Bilateral Near:     Physical Exam Vitals and nursing note reviewed.  Constitutional:      General: He is awake. He is not in acute distress.    Appearance: Normal appearance. He is well-developed and well-groomed. He is not ill-appearing.  HENT:     Right Ear: Tympanic membrane, ear canal and external ear normal.     Left Ear: Tympanic membrane, ear canal and external ear normal.     Nose: Congestion and rhinorrhea present.     Mouth/Throat:     Mouth: Mucous membranes are moist.      Pharynx: Posterior oropharyngeal erythema present. No oropharyngeal exudate.  Cardiovascular:     Rate and Rhythm: Normal rate and regular rhythm.  Pulmonary:     Effort: Pulmonary effort  is normal.     Breath sounds: Decreased air movement present. Decreased breath sounds present. No wheezing, rhonchi or rales.  Musculoskeletal:        General: Normal range of motion.  Skin:    General: Skin is warm and dry.  Neurological:     Mental Status: He is alert.  Psychiatric:        Behavior: Behavior is cooperative.      UC Treatments / Results  Labs (all labs ordered are listed, but only abnormal results are displayed) Labs Reviewed - No data to display  EKG   Radiology No results found.  Procedures Procedures (including critical care time)  Medications Ordered in UC Medications  ipratropium-albuterol (DUONEB) 0.5-2.5 (3) MG/3ML nebulizer solution 3 mL (3 mLs Nebulization Given 01/08/24 1920)    Initial Impression / Assessment and Plan / UC Course  I have reviewed the triage vital signs and the nursing notes.  Pertinent labs & imaging results that were available during my care of the patient were reviewed by me and considered in my medical decision making (see chart for details).     Upon assessment congestion and rhinorrhea are present, mild erythema noted to pharynx.  Decreased air movement and breath sounds auscultated throughout.  SpO2 was found to be 91% once patient arrived into treatment room.  DuoNeb given with improved lung sounds, symptomatic relief, and increased SpO2 of 97%.  Based on my interpretation chest x-ray did not reveal any obvious pneumonia or acute cardiopulmonary disease.  Prescribed prednisone for COPD exacerbation.  Prescribed Tessalon and Promethazine DM as needed for cough.  Discussed when to use albuterol inhaler.  Discussed over-the-counter medication for influenza-like illness symptoms. Discussed return and strict ER precautions. Final Clinical  Impressions(s) / UC Diagnoses   Final diagnoses:  Acute cough  COPD exacerbation (HCC)  Influenza-like illness     Discharge Instructions      As discussed based on my interpretation of your x-ray I do see any pneumonia today.  If the radiologist report suggests differently, you will receive a phone call regarding adjustment to treatment plan.   It is likely that you have contracted the flu from your grandson, and this has exacerbated your COPD.  Start taking prednisone once daily for 5 days.  I have prescribed Tessalon that you can take every 8 hours as needed for cough.  I have also prescribed promethazine DM cough syrup that you can take at night for cough.  This can make you drowsy so do not take while driving.    Continue to use your Symbicort inhaler daily and use albuterol inhaler as needed for mild shortness of breath, chest tightness, and wheezing.  Otherwise alternate between Tylenol and ibuprofen as needed for pain and fever.  I also recommend taking Mucinex to help with cough and congestion.  Return here if symptoms persist or worsen.  If you develop trouble breathing or severe chest pain please seek immediate medical treatment in the ER.      ED Prescriptions     Medication Sig Dispense Auth. Provider   predniSONE (DELTASONE) 20 MG tablet Take 2 tablets (40 mg total) by mouth daily for 5 days. 10 tablet Wynonia Lawman A, NP   benzonatate (TESSALON) 100 MG capsule Take 1 capsule (100 mg total) by mouth every 8 (eight) hours. 21 capsule Wynonia Lawman A, NP   promethazine-dextromethorphan (PROMETHAZINE-DM) 6.25-15 MG/5ML syrup Take 5 mLs by mouth at bedtime as needed for cough. 118 mL Wynonia Lawman A,  NP      PDMP not reviewed this encounter.   Wynonia Lawman A, NP 01/08/24 2019

## 2024-01-08 NOTE — Discharge Instructions (Signed)
 As discussed based on my interpretation of your x-ray I do see any pneumonia today.  If the radiologist report suggests differently, you will receive a phone call regarding adjustment to treatment plan.   It is likely that you have contracted the flu from your grandson, and this has exacerbated your COPD.  Start taking prednisone once daily for 5 days.  I have prescribed Tessalon that you can take every 8 hours as needed for cough.  I have also prescribed promethazine DM cough syrup that you can take at night for cough.  This can make you drowsy so do not take while driving.    Continue to use your Symbicort inhaler daily and use albuterol inhaler as needed for mild shortness of breath, chest tightness, and wheezing.  Otherwise alternate between Tylenol and ibuprofen as needed for pain and fever.  I also recommend taking Mucinex to help with cough and congestion.  Return here if symptoms persist or worsen.  If you develop trouble breathing or severe chest pain please seek immediate medical treatment in the ER.

## 2024-01-08 NOTE — ED Triage Notes (Signed)
 Chief Complaint: weakness, fever, chills, SOB, dry cough, and runny nose. Patient has a history of COPD states his chest feels tight.   Sick exposure: Yes- Patient's grandson has the flu   Onset: This past Monday   Prescriptions or OTC medications tried: Yes- Tylenol cough and could, Nyquil    with no relief  New foods, medications, or products: No  Recent Travel: No

## 2024-01-08 NOTE — ED Notes (Signed)
 Patient reports sob.  Patient is answering questions appropriately and in complete sentences.  Hands are warm to touch

## 2024-02-04 DIAGNOSIS — H401133 Primary open-angle glaucoma, bilateral, severe stage: Secondary | ICD-10-CM | POA: Diagnosis not present

## 2024-03-02 DIAGNOSIS — H401133 Primary open-angle glaucoma, bilateral, severe stage: Secondary | ICD-10-CM | POA: Diagnosis not present

## 2024-03-23 DIAGNOSIS — H401133 Primary open-angle glaucoma, bilateral, severe stage: Secondary | ICD-10-CM | POA: Diagnosis not present

## 2024-04-13 ENCOUNTER — Encounter: Payer: Self-pay | Admitting: *Deleted

## 2024-06-19 ENCOUNTER — Emergency Department (HOSPITAL_COMMUNITY)
Admission: EM | Admit: 2024-06-19 | Discharge: 2024-06-19 | Discharge status: Against Medical Advice | Attending: Emergency Medicine | Admitting: Emergency Medicine

## 2024-06-19 DIAGNOSIS — R42 Dizziness and giddiness: Secondary | ICD-10-CM | POA: Diagnosis not present

## 2024-06-19 DIAGNOSIS — R252 Cramp and spasm: Secondary | ICD-10-CM | POA: Diagnosis not present

## 2024-06-19 DIAGNOSIS — R231 Pallor: Secondary | ICD-10-CM | POA: Diagnosis not present

## 2024-06-19 DIAGNOSIS — T675XXA Heat exhaustion, unspecified, initial encounter: Secondary | ICD-10-CM | POA: Insufficient documentation

## 2024-06-19 DIAGNOSIS — Z5329 Procedure and treatment not carried out because of patient's decision for other reasons: Secondary | ICD-10-CM | POA: Insufficient documentation

## 2024-06-19 MED ORDER — SODIUM CHLORIDE 0.9 % IV BOLUS: 1000.0000 mL | Freq: Once | INTRAVENOUS | Status: DC

## 2024-06-19 NOTE — ED Triage Notes (Signed)
 Pt arrives via [facility] for heat exhaustion and cramping. 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 Pt having LR bolus ongoing on arrival.

## 2024-06-19 NOTE — ED Provider Notes (Signed)
 Kevin Fitzgerald AT [facility] Provider Note   CSN: [id number] Arrival date & time: 06/19/24  1725     Patient presents with: Heat Exposure   Kevin Fitzgerald is a 63 y.o. male.   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  He works outside [employer].  He had LR bolus with EMS.  He started to feel better.  Did not lose consciousness or hit his head.  Still having some spasms but otherwise he feels well.  Denies any weakness numbness tingling.  No chest pain or shortness of breath.  The history is provided by the patient.       Prior to Admission medications   Medication Sig Start Date End Date Taking? Authorizing Provider  acetaminophen  (TYLENOL ) 500 MG tablet Take 500 mg by mouth every 6 (six) hours as needed for moderate pain.    [provider]  Ascorbic Acid (VITAMIN C PO) Take 1 tablet by mouth daily.    [provider]  benzonatate  (TESSALON ) 100 MG capsule Take 1 capsule (100 mg total) by mouth every 8 (eight) hours. 01/08/24   Johnie Flaming A, NP  brimonidine (ALPHAGAN) 0.2 % ophthalmic solution SMARTSIG:In Eye(s) 12/23/23   [provider]  budesonide -formoterol  (SYMBICORT ) 160-4.5 MCG/ACT inhaler Inhale 2 puffs into the lungs in the morning and at bedtime. 11/12/23   Jennelle Riis, MD  cholecalciferol (VITAMIN D3) 25 MCG (1000 UNIT) tablet Take 1,000 Units by mouth daily.    [provider]  LUMIGAN 0.01 % SOLN SMARTSIG:In Eye(s) 12/23/23   [provider]  OVER THE COUNTER MEDICATION Take 1 capsule by mouth daily.    [provider]  promethazine -dextromethorphan (PROMETHAZINE -DM) 6.25-15 MG/5ML syrup Take 5 mLs by mouth at bedtime as needed for cough. 01/08/24   Johnie Flaming A, NP  tadalafil  (CIALIS ) 10 MG tablet Take 1 tablet (10 mg total) by mouth daily as needed for erectile dysfunction. 03/09/21   Delores Suzann HERO, MD  UNABLE TO FIND Med Name:  Vitamin B    [provider]  VENTOLIN  HFA 108 (90 Base) MCG/ACT inhaler INHALE 1 TO 2 PUFFS BY MOUTH EVERY 6 HOURS AS NEEDED FOR WHEEZING FOR SHORTNESS OF BREATH 12/10/23   Jennelle Riis, MD  vitamin B-12 (CYANOCOBALAMIN) 100 MCG tablet Take 100 mcg by mouth daily.    [provider]  VITAMIN E PO Take 1 tablet by mouth daily.    [provider]    Allergies: Patient has no known allergies.    Review of Systems  Updated Vital Signs BP 126/70 (BP Location: Left Arm)   Pulse 77   Temp (!) 97.4 F (36.3 C) (Oral)   Resp 16   SpO2 97%   Physical Exam Vitals and nursing note reviewed.  Constitutional:      General: He is not in acute distress.    Appearance: He is well-developed. He is not ill-appearing.  HENT:     Head: Normocephalic and atraumatic.     Nose: Nose normal.     Mouth/Throat:     Mouth: Mucous membranes are moist.  Eyes:     Extraocular Movements: Extraocular movements intact.     Conjunctiva/sclera: Conjunctivae normal.     Pupils: Pupils are equal, round, and reactive to light.  Cardiovascular:     Rate and Rhythm: Normal rate and regular rhythm.     Pulses: Normal pulses.     Heart sounds: Normal heart sounds.  No murmur heard. Pulmonary:     Effort: Pulmonary effort is normal. No respiratory distress.     Breath sounds: Normal breath sounds.  Abdominal:     General: Abdomen is flat.     Palpations: Abdomen is soft.     Tenderness: There is no abdominal tenderness.  Musculoskeletal:        General: No swelling.     Cervical back: Normal range of motion and neck supple.  Skin:    General: Skin is warm and dry.     Capillary Refill: Capillary refill takes less than 2 seconds.  Neurological:     General: No focal deficit present.     Mental Status: He is alert and oriented to person, place, and time.     Cranial Nerves: No cranial nerve deficit.     Sensory: No sensory deficit.     Motor: No weakness.     Coordination:  Coordination normal.  Psychiatric:        Mood and Affect: Mood normal.     (all labs ordered are listed, but only abnormal results are displayed) Labs Reviewed  CBC WITH DIFFERENTIAL/PLATELET  COMPREHENSIVE METABOLIC PANEL WITH GFR  CK  MAGNESIUM     EKG: None  Radiology: No results found.   Procedures   Medications Ordered in the ED  sodium chloride  0.9 % bolus 1,000 mL (has no administration in time range)                                    Medical Decision Making Amount and/or Complexity of Data Reviewed Labs: ordered.   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  He does this for a job.  He is got normal vitals.  No fever.  Has already received IV fluid bolus.  Will give him additional fluid bolus check electrolytes check CK.  He is very well-appearing.  Overall suspect heat exhaustion will evaluate for any dehydration kidney failure electrolyte abnormalities.  Overall patient did not want to stay for his workup after I had already seen him.  Patient left AGAINST MEDICAL ADVICE.  I thought he would benefit from hydration checking his electrolytes because sure he was not having rhabdomyolysis or acute kidney failure.  He has capacity to make decision.  Patient left.  This chart was dictated using voice recognition software.  Despite best efforts to proofread,  errors can occur which can change the documentation meaning.      Final diagnoses:  Heat exhaustion, initial encounter    ED Discharge Orders     None          Ruthe Cornet, DO 06/19/24 1905

## 2024-09-29 ENCOUNTER — Ambulatory Visit: Admitting: Family Medicine

## 2024-09-29 VITALS — BP 116/64 | HR 80 | Wt 155.0 lb

## 2024-09-29 DIAGNOSIS — J4520 Mild intermittent asthma, uncomplicated: Secondary | ICD-10-CM

## 2024-09-29 DIAGNOSIS — J449 Chronic obstructive pulmonary disease, unspecified: Secondary | ICD-10-CM | POA: Diagnosis not present

## 2024-09-29 DIAGNOSIS — R7309 Other abnormal glucose: Secondary | ICD-10-CM | POA: Diagnosis not present

## 2024-09-29 DIAGNOSIS — E785 Hyperlipidemia, unspecified: Secondary | ICD-10-CM

## 2024-09-29 LAB — POCT GLYCOSYLATED HEMOGLOBIN (HGB A1C): Hemoglobin A1C: 5.5 % (ref 4.0–5.6)

## 2024-09-29 MED ORDER — VENTOLIN HFA 108 (90 BASE) MCG/ACT IN AERS: 1.0000 | INHALATION_SPRAY | Freq: Four times a day (QID) | RESPIRATORY_TRACT | 0 refills | Status: DC | PRN

## 2024-09-29 NOTE — Assessment & Plan Note (Signed)
 Annual BMP, A1c, lipid panel

## 2024-09-29 NOTE — Assessment & Plan Note (Signed)
 Requesting albuterol  refill, poorly controlled at baseline extensively discussed maintenance inhalers but patient adamantly declined due to concern for medication side effects.  Current smoker, advised cessation patient declined additional assistance. -Refill albuterol  inhaler -Recommend continuing on Symbicort  2 puffs twice daily for maintenance therapy

## 2024-09-29 NOTE — Patient Instructions (Addendum)
 It was wonderful to see you today! Thank you for choosing [facility].   Please bring ALL of your medications with you to every visit.   Today we talked about:  I refilled your albuterol  inhaler, as we discussed I do recommend a maintenance inhaler as this will provide better control your asthma and COPD.  Please continue to use your Symbicort  2 puffs twice per day if you would like to consider using one of the stronger inhaler such as Breztri please follow-up in our office.  I do recommend stopping smoking altogether as this exacerbates asthma and COPD symptoms further.  Please follow up in 3 months for asthma/COPD management with PCP  Call the clinic at [phone] if your symptoms worsen or you have any concerns.  Please be sure to schedule follow up at the front desk before you leave today.   Izetta Nap, DO Family Medicine

## 2024-09-29 NOTE — Progress Notes (Signed)
    SUBJECTIVE:   CHIEF COMPLAINT / HPI:   Asthma, COPD Out of Albuterol , requesting refill. Using at least twice per day every day. Current smoker - 1 cigarette/day, 40 years. Using Symbicort , 1 puff twice per day.  Declines interest in any other maintenance inhalers.  Denies fever cough congestion.  PERTINENT  PMH / PSH: COPD, asthma, MDD, tobacco use disorder, HLD  OBJECTIVE:   BP 116/64   Pulse 80   Wt 155 lb (70.3 kg)   SpO2 98%   BMI 25.02 kg/m    General: NAD, pleasant, able to participate in exam Cardiac: RRR, no murmurs. Respiratory: CTAB, normal effort, No wheezes, rales or rhonchi Extremities: no edema or cyanosis. Skin: warm and dry, no rashes noted Neuro: alert, no obvious focal deficits Psych: Normal affect and mood  ASSESSMENT/PLAN:   Assessment & Plan Chronic obstructive pulmonary disease, unspecified COPD type (HCC) Mild intermittent asthma without complication Requesting albuterol  refill, poorly controlled at baseline extensively discussed maintenance inhalers but patient adamantly declined due to concern for medication side effects.  Current smoker, advised cessation patient declined additional assistance. -Refill albuterol  inhaler -Recommend continuing on Symbicort  2 puffs twice daily for maintenance therapy Hyperlipidemia, unspecified hyperlipidemia type Annual BMP, A1c, lipid panel   Dr. Izetta Nap, DO [facility]

## 2024-09-30 LAB — BASIC METABOLIC PANEL WITH GFR
BUN/Creatinine Ratio: 27 — ABNORMAL HIGH (ref 10–24)
BUN: 21 mg/dL (ref 8–27)
CO2: 23 mmol/L (ref 20–29)
Calcium: 9.8 mg/dL (ref 8.6–10.2)
Chloride: 102 mmol/L (ref 96–106)
Creatinine, Ser: 0.79 mg/dL (ref 0.76–1.27)
Glucose: 124 mg/dL — ABNORMAL HIGH (ref 70–99)
Potassium: 4 mmol/L (ref 3.5–5.2)
Sodium: 138 mmol/L (ref 134–144)
eGFR: 101 mL/min/1.73 (ref >59)

## 2024-09-30 LAB — LIPID PANEL
Chol/HDL Ratio: 2.6 ratio (ref 0.0–5.0)
Cholesterol, Total: 199 mg/dL (ref 100–199)
HDL: 77 mg/dL (ref >39)
LDL Chol Calc (NIH): 92 mg/dL (ref 0–99)
Triglycerides: 182 mg/dL — ABNORMAL HIGH (ref 0–149)
VLDL Cholesterol Cal: 30 mg/dL (ref 5–40)

## 2024-10-03 ENCOUNTER — Ambulatory Visit (HOSPITAL_COMMUNITY): Payer: Self-pay | Admitting: Family Medicine

## 2024-10-23 ENCOUNTER — Other Ambulatory Visit: Payer: Self-pay | Admitting: Family Medicine

## 2024-10-23 DIAGNOSIS — J449 Chronic obstructive pulmonary disease, unspecified: Secondary | ICD-10-CM

## 2024-10-23 DIAGNOSIS — J4520 Mild intermittent asthma, uncomplicated: Secondary | ICD-10-CM

## 2024-10-25 NOTE — Telephone Encounter (Signed)
 Chart reviewed. Rx refilled.

## 2024-11-09 ENCOUNTER — Ambulatory Visit: Payer: Self-pay | Admitting: Family Medicine
